# Patient Record
Sex: Male | Born: 1950
Health system: Southern US, Community
[De-identification: ages and names within clinical notes are randomized; demographics above are authoritative.]

## PROBLEM LIST (undated history)

## (undated) DIAGNOSIS — N200 Calculus of kidney: Secondary | ICD-10-CM

## (undated) DIAGNOSIS — Z8719 Personal history of other diseases of the digestive system: Secondary | ICD-10-CM

## (undated) DIAGNOSIS — R112 Nausea with vomiting, unspecified: Secondary | ICD-10-CM

## (undated) DIAGNOSIS — R011 Cardiac murmur, unspecified: Secondary | ICD-10-CM

## (undated) DIAGNOSIS — M459 Ankylosing spondylitis of unspecified sites in spine: Secondary | ICD-10-CM

## (undated) DIAGNOSIS — K469 Unspecified abdominal hernia without obstruction or gangrene: Secondary | ICD-10-CM

## (undated) DIAGNOSIS — R931 Abnormal findings on diagnostic imaging of heart and coronary circulation: Secondary | ICD-10-CM

## (undated) DIAGNOSIS — I719 Aortic aneurysm of unspecified site, without rupture: Secondary | ICD-10-CM

## (undated) DIAGNOSIS — R519 Headache, unspecified: Secondary | ICD-10-CM

## (undated) DIAGNOSIS — T8859XA Other complications of anesthesia, initial encounter: Secondary | ICD-10-CM

## (undated) DIAGNOSIS — K635 Polyp of colon: Secondary | ICD-10-CM

## (undated) DIAGNOSIS — K529 Noninfective gastroenteritis and colitis, unspecified: Secondary | ICD-10-CM

## (undated) DIAGNOSIS — I77810 Thoracic aortic ectasia: Secondary | ICD-10-CM

## (undated) DIAGNOSIS — K219 Gastro-esophageal reflux disease without esophagitis: Secondary | ICD-10-CM

## (undated) DIAGNOSIS — Z87442 Personal history of urinary calculi: Secondary | ICD-10-CM

## (undated) DIAGNOSIS — I351 Nonrheumatic aortic (valve) insufficiency: Secondary | ICD-10-CM

## (undated) DIAGNOSIS — Z9889 Other specified postprocedural states: Secondary | ICD-10-CM

## (undated) HISTORY — PX: HIATAL HERNIA REPAIR: SHX195

## (undated) HISTORY — DX: Abnormal findings on diagnostic imaging of heart and coronary circulation: R93.1

## (undated) HISTORY — DX: Gastro-esophageal reflux disease without esophagitis: K21.9

## (undated) HISTORY — PX: CORNEAL TRANSPLANT: SHX108

## (undated) HISTORY — DX: Noninfective gastroenteritis and colitis, unspecified: K52.9

## (undated) HISTORY — DX: Calculus of kidney: N20.0

## (undated) HISTORY — PX: COLONOSCOPY: SHX174

## (undated) HISTORY — DX: Aortic aneurysm of unspecified site, without rupture: I71.9

## (undated) HISTORY — PX: PROSTATE BIOPSY: SHX241

## (undated) HISTORY — DX: Nonrheumatic aortic (valve) insufficiency: I35.1

## (undated) HISTORY — PX: UPPER GASTROINTESTINAL ENDOSCOPY: SHX188

## (undated) HISTORY — DX: Ankylosing spondylitis of unspecified sites in spine: M45.9

## (undated) HISTORY — DX: Thoracic aortic ectasia: I77.810

## (undated) HISTORY — DX: Polyp of colon: K63.5

## (undated) HISTORY — DX: Unspecified abdominal hernia without obstruction or gangrene: K46.9

---

## 2006-12-08 DIAGNOSIS — R160 Hepatomegaly, not elsewhere classified: Secondary | ICD-10-CM | POA: Insufficient documentation

## 2014-08-09 HISTORY — PX: CATARACT EXTRACTION: SUR2

## 2016-08-09 HISTORY — PX: HERNIA REPAIR: SHX51

## 2016-10-07 DIAGNOSIS — S53105A Unspecified dislocation of left ulnohumeral joint, initial encounter: Secondary | ICD-10-CM | POA: Insufficient documentation

## 2016-12-07 DIAGNOSIS — K469 Unspecified abdominal hernia without obstruction or gangrene: Secondary | ICD-10-CM | POA: Insufficient documentation

## 2017-12-14 DIAGNOSIS — K8042 Calculus of bile duct with acute cholecystitis without obstruction: Secondary | ICD-10-CM | POA: Insufficient documentation

## 2018-01-07 DIAGNOSIS — Z9889 Other specified postprocedural states: Secondary | ICD-10-CM | POA: Insufficient documentation

## 2018-01-07 HISTORY — PX: CHOLECYSTECTOMY: SHX55

## 2018-03-09 HISTORY — PX: KIDNEY STONE SURGERY: SHX686

## 2018-10-17 ENCOUNTER — Ambulatory Visit: Payer: Self-pay | Admitting: Internal Medicine

## 2018-11-08 DIAGNOSIS — K219 Gastro-esophageal reflux disease without esophagitis: Secondary | ICD-10-CM | POA: Insufficient documentation

## 2018-11-10 ENCOUNTER — Ambulatory Visit: Payer: Self-pay | Admitting: Internal Medicine

## 2018-11-23 ENCOUNTER — Ambulatory Visit: Payer: Self-pay | Admitting: Rheumatology

## 2018-12-12 ENCOUNTER — Telehealth: Payer: Self-pay | Admitting: *Deleted

## 2018-12-12 NOTE — Telephone Encounter (Signed)
Copied from [place] [phone]. Topic: Appointment Scheduling - Scheduling Inquiry for Clinic >> Dec 12, 2018  2:33 PM Alanda Slim E wrote: Reason for CRM: Pt called to check if office received records from previous provider/ please advise  He also wanted to confirm appt on 6.2.20

## 2018-12-14 NOTE — Telephone Encounter (Signed)
Records received and appointment confirmed with patient.

## 2018-12-15 NOTE — Progress Notes (Signed)
Office Visit Note  Patient: Antonio Ayers             Date of Birth: 1951/02/18           MRN: [id number]             PCP: [name], [name], MD Referring: Daylene Posey, MD Visit Date: 12/19/2018 Occupation: Retired  Subjective:  Ankylosing spondylitis.   History of Present Illness: Antonio Ayers is a 68 y.o. male seen in consultation per request of his PCP.  According to patient he developed symptoms of ankylosing spondylitis while he was in high school.  He states he is to have knee joint swelling and lower back pain.  He states when he was in his 58s and 40s the pain got worse and he started taking Indocin.  He was on Indocin for many years.  He states at age 77 he started seeing a rheumatologist who offered different medications but he declined.  And he continued taking anti-inflammatories.  He recalls that his ankylosing spondylitis gradually got worse to the point his unable to raise his shoulders.  He has chronic neck and lower back pain.  He states his rheumatologist moved out of town and he was under care of his primary care physician for a long time in [place].  About 4 years ago he started seeing a rheumatologist and initially was on different NSAIDs and then started taking Humira.  He states Humira was a started at every other week dosing but it was not very effective.  Last fall it was approved to every week dosing by his insurance.  He noticed improvement on weekly dosage of Humira.  He states he has less stiffness and better mobility now.  He still have difficulty walking due to tight muscles.  He gives history of intermittent swelling in his knee joints and ankle joints.  He describes a stiffness in his neck, lower back, shoulders, left elbow, hands, hips, knee joints ankles and feet.  Patient states that he had colonoscopy in 2016 was negative except for internal hemorrhoids.  Activities of Daily Living:  Patient reports morning stiffness for 5-10 minutes.    Patient Denies nocturnal pain.  Difficulty dressing/grooming: Reports Difficulty climbing stairs: Reports Difficulty getting out of chair: Reports Difficulty using hands for taps, buttons, cutlery, and/or writing: Denies  Review of Systems  Constitutional: Positive for fatigue. Negative for night sweats.  HENT: Positive for mouth dryness. Negative for mouth sores and nose dryness.   Eyes: Negative for redness and dryness.  Respiratory: Negative for shortness of breath and difficulty breathing.   Cardiovascular: Negative for chest pain, palpitations, hypertension, irregular heartbeat and swelling in legs/feet.  Gastrointestinal: Positive for blood in stool. Negative for constipation and diarrhea.       Patient had internal hemorrhoids on the colonoscopy.  Endocrine: Negative for increased urination.  Musculoskeletal: Positive for arthralgias, joint pain, myalgias, morning stiffness and myalgias. Negative for joint swelling, muscle weakness and muscle tenderness.  Skin: Negative for color change, rash, hair loss, nodules/bumps, skin tightness, ulcers and sensitivity to sunlight.  Allergic/Immunologic: Negative for susceptible to infections.  Neurological: Negative for dizziness, fainting, memory loss, night sweats and weakness ( ).  Hematological: Negative for swollen glands.  Psychiatric/Behavioral: Negative for depressed mood and sleep disturbance. The patient is not nervous/anxious.     PMFS History:  Patient Active Problem List   Diagnosis Date Noted   Raised intraocular pressure of both eyes 12/19/2018   Ankylosing spondylitis of multiple  sites in spine [facility]) 12/19/2018    History reviewed. No pertinent past medical history.  Family History  Problem Relation Age of Onset   Stroke Mother    Stroke Father    Heart failure Father    Diabetes Sister    Obesity Sister    Hiatal hernia Sister    Past Surgical History:  Procedure Laterality Date   CATARACT EXTRACTION  Left 2016   CHOLECYSTECTOMY  01/2018   CORNEAL TRANSPLANT  1989, 1992, 2014   [place]  2018   HIATAL HERNIA REPAIR     KIDNEY STONE SURGERY  03/2018   Social History   Social History Narrative   Not on file    There is no immunization history on file for this patient.   Objective: Vital Signs: BP (!) 109/56 (BP Location: Right Arm, Patient Position: Sitting, Cuff Size: Normal)    Pulse (!) 58    Resp 14    Ht 6' 0.5" (1.842 m)    Wt 207 lb (93.9 kg)    BMI 27.69 kg/m    Physical Exam Vitals signs and nursing note reviewed.  Constitutional:      Appearance: He is well-developed.  HENT:     Head: Normocephalic and atraumatic.  Eyes:     Conjunctiva/sclera: Conjunctivae normal.     Pupils: Pupils are equal, round, and reactive to light.  Neck:     Musculoskeletal: Normal range of motion and neck supple.  Cardiovascular:     Rate and Rhythm: Normal rate and regular rhythm.     Heart sounds: Normal heart sounds.  Pulmonary:     Effort: Pulmonary effort is normal.     Breath sounds: Normal breath sounds.  Abdominal:     General: Bowel sounds are normal.     Palpations: Abdomen is soft.  Skin:    General: Skin is warm and dry.     Capillary Refill: Capillary refill takes less than 2 seconds.  Neurological:     Mental Status: He is alert and oriented to person, place, and time.  Psychiatric:        Behavior: Behavior normal.      Musculoskeletal Exam: Patient has very limited range of motion of his cervical, thoracic and lumbar spine.  Thoracic kyphosis and lumbar scoliosis was noted.  Schober's was very limited.  Head to toe distance was about 14 inches.  Elbow joints, wrist joints, MCPs PIPs and DIPs been good range of motion with no synovitis.  He had no range of motion in her joints.  Knee joints noted tension but no synovitis was noted.  Ankle joints MTPs with good range of motion.  CDAI Exam: CDAI Score: Not documented Patient Global Assessment: Not  documented; Provider Global Assessment: Not documented Swollen: Not documented; Tender: Not documented Joint Exam   Not documented   There is currently no information documented on the homunculus. Go to the Rheumatology activity and complete the homunculus joint exam.  Investigation: No additional findings.  Imaging: No results found.  Recent Labs: No results found for: WBC, HGB, PLT, NA, K, CL, CO2, GLUCOSE, BUN, CREATININE, BILITOT, ALKPHOS, AST, ALT, PROT, ALBUMIN, CALCIUM, GFRAA, QFTBGOLD, QFTBGOLDPLUS  Speciality Comments: No specialty comments available.  Procedures:  No procedures performed Allergies: Patient has no known allergies.   Assessment / Plan:     Visit Diagnoses: Ankylosing spondylitis of multiple sites in spine [facility]) - Pt moved from [place], last seen 06/07/18 by his rheumatologist.  Patient states that his symptoms  started while he was in high school.  He has been on treatment for the last 3 to 4 years.  Initially treated with with NSAIDs for many years and then started Humira couple of years ago.  He states his symptoms improved since he has been on Humira weekly dosing for the last few months.  He continues to have a stiffness and discomfort in multiple joints.  He has noticed improvement in his mobility as well.  High risk medication use - Previously on Humira 40 mg every week -week.  Detailed counseling was provided regarding the indications side effects contraindications.  A handout was given and consent was taken.  I will obtain following labs.  Once the labs are available then we can start him on Humira.  Plan: CBC with Differential/Platelet, COMPLETE METABOLIC PANEL WITH GFR, Hepatitis B core antibody, IgM, Hepatitis B surface antigen, Hepatitis C antibody, QuantiFERON-TB Gold Plus, HIV Antibody (routine testing w rflx), Serum protein electrophoresis with reflex, IgG, IgA, IgM, DG Chest 2 View  Medication counseling:     Does patient have diagnosis of heart  failure?  No  Counseled patient that Humira is a TNF blocking agent.  Reviewed Humira dose of 40 mg every  week.  Counseled patient on purpose, proper use, and adverse effects of Humira.  Reviewed the most common adverse effects including infections, headache, and injection site reactions. Discussed that there is the possibility of an increased risk of malignancy but it is not well understood if this increased risk is due to the medication or the disease state.  Advised patient to get yearly dermatology exams due to risk of skin cancer.  Reviewed the importance of regular labs while on Humira therapy.  Advised patient to get standing labs one month after starting Humira then every 3 months.  Provided patient with standing lab orders.  Counseled patient that Humira should be held prior to scheduled surgery.  Counseled patient to avoid live vaccines while on Humira.  Advised patient to get annual influenza vaccine and the pneumococcal vaccine as needed.  Provided patient with medication education material and answered all questions.  Patient voiced understanding.  Patient consented to Humira.  Will upload consent into the media tab.  Reviewed storage instructions of Humira.  Advised initial injection must be administered in office.    Patient voiced understanding.     Chronic pain of both hips -patient has almost no range of motion in his hip joints.  Plan: XR HIPS BILAT W OR W/O PELVIS 2V.  The x-ray showed severe hip joint narrowing.  I counseled patient regarding total hip replacement but he is not prepared at this time.  Chronic pain of both knees -he has limited extension of bilateral knee joints.  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  He has limited extension of his knee joints.  Raised intraocular pressure of both eyes-he has been followed by ophthalmology.  Other fatigue - Plan: CK, TSH    Orders: Orders Placed This Encounter  Procedures   XR KNEE 3 VIEW RIGHT   XR KNEE 3 VIEW LEFT   XR HIPS BILAT W OR W/O PELVIS 2V   DG Chest 2 View   CBC with Differential/Platelet   COMPLETE METABOLIC PANEL WITH GFR   CK   TSH   Hepatitis B core antibody, IgM   Hepatitis B surface antigen   Hepatitis C antibody   QuantiFERON-TB Gold Plus  HIV Antibody (routine testing w rflx)   Serum protein electrophoresis with reflex   IgG, IgA, IgM   ANA   No orders of the defined types were placed in this encounter.   Face-to-face time spent with patient was 50 minutes. Greater than 50% of time was spent in counseling and coordination of care.  Follow-Up Instructions: Return for Ankylosing spondylitis.   Bo Merino, MD  Note - This record has been created using Editor, commissioning.  Chart creation errors have been sought, but may not always  have been located. Such creation errors do not reflect on  the standard of medical care.

## 2018-12-19 ENCOUNTER — Encounter: Payer: Self-pay | Admitting: Rheumatology

## 2018-12-19 ENCOUNTER — Ambulatory Visit: Payer: Self-pay

## 2018-12-19 ENCOUNTER — Ambulatory Visit (INDEPENDENT_AMBULATORY_CARE_PROVIDER_SITE_OTHER): Payer: Medicare Other

## 2018-12-19 ENCOUNTER — Ambulatory Visit: Payer: Medicare Other | Admitting: Rheumatology

## 2018-12-19 ENCOUNTER — Other Ambulatory Visit: Payer: Self-pay

## 2018-12-19 VITALS — BP 109/56 | HR 58 | Resp 14 | Ht 72.5 in | Wt 207.0 lb

## 2018-12-19 DIAGNOSIS — M25551 Pain in right hip: Secondary | ICD-10-CM

## 2018-12-19 DIAGNOSIS — M25562 Pain in left knee: Secondary | ICD-10-CM

## 2018-12-19 DIAGNOSIS — M25552 Pain in left hip: Secondary | ICD-10-CM

## 2018-12-19 DIAGNOSIS — H40053 Ocular hypertension, bilateral: Secondary | ICD-10-CM

## 2018-12-19 DIAGNOSIS — M25561 Pain in right knee: Secondary | ICD-10-CM | POA: Diagnosis not present

## 2018-12-19 DIAGNOSIS — G8929 Other chronic pain: Secondary | ICD-10-CM

## 2018-12-19 DIAGNOSIS — R5383 Other fatigue: Secondary | ICD-10-CM | POA: Diagnosis not present

## 2018-12-19 DIAGNOSIS — Z79899 Other long term (current) drug therapy: Secondary | ICD-10-CM | POA: Diagnosis not present

## 2018-12-19 DIAGNOSIS — M450 Ankylosing spondylitis of multiple sites in spine: Secondary | ICD-10-CM

## 2018-12-19 NOTE — Patient Instructions (Signed)
Standing Labs We placed an order today for your standing lab work.    Please come back and get your standing labs every 3 months.   We have open lab Monday through Friday from 8:30-11:30 AM and 1:30-4:00 PM  at the office of Dr. Bo Merino.   You may experience shorter wait times on Monday and Friday afternoons. The office is located at [address], [place], [place], [place] [address] No appointment is necessary.   Labs are drawn by [facility].  You may receive a bill from [facility] for your lab work.  If you wish to have your labs drawn at another location, please call the office 24 hours in advance to send orders.  If you have any questions regarding directions or hours of operation,  please call [phone].   Just as a reminder please drink plenty of water prior to coming for your lab work. Thanks!   Adalimumab Injection What is this medicine? ADALIMUMAB (a dal AYE mu mab) is used to treat rheumatoid and psoriatic arthritis. It is also used to treat ankylosing spondylitis, Crohn's disease, ulcerative colitis, plaque psoriasis, hidradenitis suppurativa, and uveitis. This medicine may be used for other purposes; ask your health care provider or pharmacist if you have questions. COMMON BRAND NAME(S): CYLTEZO, Humira What should I tell my health care provider before I take this medicine? They need to know if you have any of these conditions: -diabetes -heart disease -hepatitis B or history of hepatitis B infection -immune system problems -infection or history of infections -multiple sclerosis -recently received or scheduled to receive a vaccine -scheduled to have surgery -tuberculosis, a positive skin test for tuberculosis or have recently been in close contact with someone who has tuberculosis -an unusual reaction to adalimumab, other medicines, mannitol, latex, rubber, foods, dyes, or preservatives -pregnant or trying to get pregnant -breast-feeding How should I use this  medicine? This medicine is for injection under the skin. You will be taught how to prepare and give this medicine. Use exactly as directed. Take your medicine at regular intervals. Do not take your medicine more often than directed. A special MedGuide will be given to you by the pharmacist with each prescription and refill. Be sure to read this information carefully each time. It is important that you put your used needles and syringes in a special sharps container. Do not put them in a trash can. If you do not have a sharps container, call your pharmacist or healthcare provider to get one. Talk to your pediatrician regarding the use of this medicine in children. While this drug may be prescribed for children as young as 2 years for selected conditions, precautions do apply. The manufacturer of the medicine offers free information to patients and their health care partners. Call [phone] for more information. Overdosage: If you think you have taken too much of this medicine contact a poison control center or emergency room at once. NOTE: This medicine is only for you. Do not share this medicine with others. What if I miss a dose? If you miss a dose, take it as soon as you can. If it is almost time for your next dose, take only that dose. Do not take double or extra doses. Give the next dose when your next scheduled dose is due. Call your doctor or health care professional if you are not sure how to handle a missed dose. What may interact with this medicine? Do not take this medicine with any of the following medications: -abatacept -  anakinra -etanercept -infliximab -live virus vaccines -rilonacept This medicine may also interact with the following medications: -vaccines This list may not describe all possible interactions. Give your health care provider a list of all the medicines, herbs, non-prescription drugs, or dietary supplements you use. Also tell them if you smoke, drink alcohol, or  use illegal drugs. Some items may interact with your medicine. What should I watch for while using this medicine? Visit your doctor or health care professional for regular checks on your progress. Tell your doctor or healthcare professional if your symptoms do not start to get better or if they get worse. You will be tested for tuberculosis (TB) before you start this medicine. If your doctor prescribes any medicine for TB, you should start taking the TB medicine before starting this medicine. Make sure to finish the full course of TB medicine. Call your doctor or health care professional if you get a cold or other infection while receiving this medicine. Do not treat yourself. This medicine may decrease your body's ability to fight infection. Talk to your doctor about your risk of cancer. You may be more at risk for certain types of cancers if you take this medicine. What side effects may I notice from receiving this medicine? Side effects that you should report to your doctor or health care professional as soon as possible: -allergic reactions like skin rash, itching or hives, swelling of the face, lips, or tongue -breathing problems -changes in vision -chest pain -fever, chills, or any other sign of infection -numbness or tingling -red, scaly patches or raised bumps on the skin -swelling of the ankles -swollen lymph nodes in the neck, underarm, or groin areas -unexplained weight loss -unusual bleeding or bruising -unusually weak or tired Side effects that usually do not require medical attention (report to your doctor or health care professional if they continue or are bothersome): -headache -nausea -redness, itching, swelling, or bruising at site where injected This list may not describe all possible side effects. Call your doctor for medical advice about side effects. You may report side effects to FDA at 1-800-FDA-1088. Where should I keep my medicine? Keep out of the reach of  children. Store in the original container and in the refrigerator between 2 and 8 degrees C (36 and 46 degrees F). Do not freeze. The product may be stored in a cool carrier with an ice pack, if needed. Protect from light. Throw away any unused medicine after the expiration date. NOTE: This sheet is a summary. It may not cover all possible information. If you have questions about this medicine, talk to your doctor, pharmacist, or health care provider.  2019 Elsevier/Gold Standard (2015-02-12 11:11:43)

## 2018-12-22 ENCOUNTER — Ambulatory Visit (HOSPITAL_COMMUNITY)
Admission: RE | Admit: 2018-12-22 | Discharge: 2018-12-22 | Disposition: A | Discharge status: Home / Self Care | Payer: Medicare Other | Source: Ambulatory Visit | Attending: Rheumatology | Admitting: Rheumatology

## 2018-12-22 ENCOUNTER — Other Ambulatory Visit: Payer: Self-pay

## 2018-12-22 ENCOUNTER — Telehealth: Payer: Self-pay | Admitting: *Deleted

## 2018-12-22 DIAGNOSIS — Z79899 Other long term (current) drug therapy: Secondary | ICD-10-CM | POA: Insufficient documentation

## 2018-12-22 DIAGNOSIS — R0602 Shortness of breath: Secondary | ICD-10-CM | POA: Diagnosis not present

## 2018-12-22 DIAGNOSIS — R778 Other specified abnormalities of plasma proteins: Secondary | ICD-10-CM

## 2018-12-22 LAB — IGG, IGA, IGM
IgG (Immunoglobin G), Serum: 843 mg/dL (ref 600–1540)
IgM, Serum: 35 mg/dL — ABNORMAL LOW (ref 50–300)
Immunoglobulin A: 425 mg/dL — ABNORMAL HIGH (ref 70–320)

## 2018-12-22 LAB — HEPATITIS C ANTIBODY
Hepatitis C Ab: NONREACTIVE
SIGNAL TO CUT-OFF: 0.05 (ref <1.00)

## 2018-12-22 LAB — CBC WITH DIFFERENTIAL/PLATELET
Absolute Monocytes: 850 cells/uL (ref 200–950)
Basophils Absolute: 31 cells/uL (ref 0–200)
Basophils Relative: 0.4 %
Eosinophils Absolute: 101 cells/uL (ref 15–500)
Eosinophils Relative: 1.3 %
HCT: 46.1 % (ref 38.5–50.0)
Hemoglobin: 16.3 g/dL (ref 13.2–17.1)
Lymphs Abs: 1225 cells/uL (ref 850–3900)
MCH: 31.8 pg (ref 27.0–33.0)
MCHC: 35.4 g/dL (ref 32.0–36.0)
MCV: 90 fL (ref 80.0–100.0)
MPV: 9.6 fL (ref 7.5–12.5)
Monocytes Relative: 10.9 %
Neutro Abs: 5593 cells/uL (ref 1500–7800)
Neutrophils Relative %: 71.7 %
Platelets: 205 10*3/uL (ref 140–400)
RBC: 5.12 10*6/uL (ref 4.20–5.80)
RDW: 13 % (ref 11.0–15.0)
Total Lymphocyte: 15.7 %
WBC: 7.8 10*3/uL (ref 3.8–10.8)

## 2018-12-22 LAB — IFE INTERPRETATION

## 2018-12-22 LAB — COMPLETE METABOLIC PANEL WITH GFR
AG Ratio: 1.8 (calc) (ref 1.0–2.5)
ALT: 18 U/L (ref 9–46)
AST: 18 U/L (ref 10–35)
Albumin: 4.2 g/dL (ref 3.6–5.1)
Alkaline phosphatase (APISO): 64 U/L (ref 35–144)
BUN: 16 mg/dL (ref 7–25)
CO2: 25 mmol/L (ref 20–32)
Calcium: 9.1 mg/dL (ref 8.6–10.3)
Chloride: 103 mmol/L (ref 98–110)
Creat: 0.85 mg/dL (ref 0.70–1.25)
GFR, Est African American: 104 mL/min/{1.73_m2} (ref >=60)
GFR, Est Non African American: 90 mL/min/{1.73_m2} (ref >=60)
Globulin: 2.4 g/dL (calc) (ref 1.9–3.7)
Glucose, Bld: 91 mg/dL (ref 65–139)
Potassium: 4.4 mmol/L (ref 3.5–5.3)
Sodium: 139 mmol/L (ref 135–146)
Total Bilirubin: 3.2 mg/dL — ABNORMAL HIGH (ref 0.2–1.2)
Total Protein: 6.6 g/dL (ref 6.1–8.1)

## 2018-12-22 LAB — PROTEIN ELECTROPHORESIS, SERUM, WITH REFLEX
Albumin ELP: 4.1 g/dL (ref 3.8–4.8)
Alpha 1: 0.3 g/dL (ref 0.2–0.3)
Alpha 2: 0.6 g/dL (ref 0.5–0.9)
Beta 2: 0.5 g/dL (ref 0.2–0.5)
Beta Globulin: 0.5 g/dL (ref 0.4–0.6)
Gamma Globulin: 0.7 g/dL — ABNORMAL LOW (ref 0.8–1.7)
Total Protein: 6.7 g/dL (ref 6.1–8.1)

## 2018-12-22 LAB — QUANTIFERON-TB GOLD PLUS
Mitogen-NIL: >10 IU/mL
NIL: 0.02 IU/mL
QuantiFERON-TB Gold Plus: NEGATIVE
TB1-NIL: 0.01 IU/mL
TB2-NIL: 0 IU/mL

## 2018-12-22 LAB — TSH: TSH: 1.59 mIU/L (ref 0.40–4.50)

## 2018-12-22 LAB — HEPATITIS B CORE ANTIBODY, IGM: Hep B C IgM: NONREACTIVE

## 2018-12-22 LAB — HEPATITIS B SURFACE ANTIGEN: Hepatitis B Surface Ag: NONREACTIVE

## 2018-12-22 LAB — CK: Total CK: 103 U/L (ref 44–196)

## 2018-12-22 LAB — HIV ANTIBODY (ROUTINE TESTING W REFLEX): HIV 1&2 Ab, 4th Generation: NONREACTIVE

## 2018-12-22 LAB — ANA: Anti Nuclear Antibody (ANA): NEGATIVE

## 2018-12-22 NOTE — Telephone Encounter (Signed)
-----   Message from Bo Merino, MD sent at 12/22/2018  9:40 AM EDT ----- Labs are all WNLs except IFE. Recommend evluation by hematology. It is not urgent . He may continue Humira.

## 2018-12-22 NOTE — Progress Notes (Signed)
Labs are all WNLs except IFE. Recommend evluation by hematology. It is not urgent . He may continue Humira.

## 2018-12-25 ENCOUNTER — Ambulatory Visit: Payer: Self-pay | Admitting: Rheumatology

## 2018-12-25 NOTE — Progress Notes (Signed)
CXR normal.

## 2018-12-26 ENCOUNTER — Telehealth: Payer: Self-pay | Admitting: Internal Medicine

## 2018-12-26 ENCOUNTER — Encounter: Payer: Self-pay | Admitting: Rheumatology

## 2018-12-26 NOTE — Telephone Encounter (Signed)
A new hem appt has been scheduled for the pt to see Dr. Walden Field on 6/5 at 930am. Pt aware to arrive 15 minutes early.

## 2018-12-29 DIAGNOSIS — M160 Bilateral primary osteoarthritis of hip: Secondary | ICD-10-CM | POA: Insufficient documentation

## 2018-12-29 DIAGNOSIS — M170 Bilateral primary osteoarthritis of knee: Secondary | ICD-10-CM | POA: Insufficient documentation

## 2018-12-29 NOTE — Progress Notes (Signed)
Office Visit Note  Patient: Antonio Ayers             Date of Birth: 27-Apr-1951           MRN: [id number]             PCP: [name], [name], MD Referring: [name], [facility]* Visit Date: 01/02/2019 Occupation: @GUAROCC @  Subjective:  Lower back pain and stiffness   History of Present Illness: Antonio Ayers is a 68 y.o. male with history of ankylosing spondylitis and osteoarthritis.  He is currently Humira 40 mg sq injections weekly.  His most recent injection was today.  He has occasional knee joint pain and joint swelling.  He has intermittent SI joint pain bilaterally.  He experiences pain and limited ROM of both hip joints.  He has occasional ankle joint pain and swelling. He has morning stiffness for about 10 minutes.    Activities of Daily Living:  Patient reports morning stiffness for 10 minutes.   Patient Reports nocturnal pain.  Difficulty dressing/grooming: Reports Difficulty climbing stairs: Reports Difficulty getting out of chair: Reports Difficulty using hands for taps, buttons, cutlery, and/or writing: Denies  Review of Systems  Constitutional: Negative for fatigue and night sweats.  HENT: Positive for mouth dryness. Negative for mouth sores and nose dryness.   Eyes: Negative for pain, redness, itching and dryness.  Respiratory: Negative for cough, hemoptysis, shortness of breath, wheezing and difficulty breathing.   Cardiovascular: Negative for chest pain, palpitations, hypertension, irregular heartbeat and swelling in legs/feet.  Gastrointestinal: Positive for constipation and diarrhea. Negative for abdominal pain and blood in stool.  Endocrine: Negative for increased urination.  Genitourinary: Negative for painful urination.  Musculoskeletal: Positive for arthralgias, joint pain, joint swelling and morning stiffness. Negative for myalgias, muscle weakness, muscle tenderness and myalgias.  Skin: Positive for rash and redness. Negative for color  change, hair loss, nodules/bumps, skin tightness, ulcers and sensitivity to sunlight.  Allergic/Immunologic: Negative for susceptible to infections.  Neurological: Positive for headaches. Negative for dizziness, fainting, light-headedness, memory loss, night sweats and weakness.  Hematological: Negative for swollen glands.  Psychiatric/Behavioral: Negative for depressed mood, confusion and sleep disturbance. The patient is not nervous/anxious.     PMFS History:  Patient Active Problem List   Diagnosis Date Noted  . Primary osteoarthritis of both hips 12/29/2018  . Primary osteoarthritis of both knees 12/29/2018  . Raised intraocular pressure of both eyes 12/19/2018  . Ankylosing spondylitis of multiple sites in spine ([place]) 12/19/2018    History reviewed. No pertinent past medical history.  Family History  Problem Relation Age of Onset  . Stroke Mother   . Stroke Father   . Heart failure Father   . Diabetes Sister   . Obesity Sister   . Hiatal hernia Sister    Past Surgical History:  Procedure Laterality Date  . CATARACT EXTRACTION Left 2016  . CHOLECYSTECTOMY  01/2018  . [place], 2014  . HERNIA REPAIR  2018  . HIATAL HERNIA REPAIR    . KIDNEY STONE SURGERY  03/2018   Social History   Social History Narrative  . Not on file    There is no immunization history on file for this patient.   Objective: Vital Signs: BP 100/60 (BP Location: Left Arm, Patient Position: Sitting, Cuff Size: Normal)   Pulse (!) 48   Resp 14   Ht 6' 0.5" (1.842 m)   Wt 220 lb (99.8 kg)   BMI 29.43 kg/m  Physical Exam Vitals signs and nursing note reviewed.  Constitutional:      Appearance: He is well-developed.  HENT:     Head: Normocephalic and atraumatic.  Eyes:     Conjunctiva/sclera: Conjunctivae normal.     Pupils: Pupils are equal, round, and reactive to light.  Neck:     Musculoskeletal: Normal range of motion and neck supple.  Cardiovascular:     Rate  and Rhythm: Normal rate and regular rhythm.     Heart sounds: Normal heart sounds.  Pulmonary:     Effort: Pulmonary effort is normal.     Breath sounds: Normal breath sounds.  Abdominal:     General: Bowel sounds are normal.     Palpations: Abdomen is soft.  Lymphadenopathy:     Cervical: No cervical adenopathy.  Skin:    General: Skin is warm and dry.     Capillary Refill: Capillary refill takes less than 2 seconds.  Neurological:     Mental Status: He is alert and oriented to person, place, and time.  Psychiatric:        Behavior: Behavior normal.      Musculoskeletal Exam: Very limited ROM of C-spine, thoracic spine, and lumbar spine. Thoracic kyphosis noted. Shoulder joints, elbow joints, wrist joints, MCPs, PIPs, DIPs good range of motion no synovitis. Very limited ROM of both hip joints. Limited extension of bilateral knee joints.  No warmth or effusion of bilateral knee joints.  Ankle joints, MTPs, PIPs, DIPs good range of motion no synovitis.  CDAI Exam: CDAI Score: Not documented Patient Global Assessment: Not documented; Provider Global Assessment: Not documented Swollen: Not documented; Tender: Not documented Joint Exam   Not documented   There is currently no information documented on the homunculus. Go to the Rheumatology activity and complete the homunculus joint exam.  Investigation: No additional findings.  Imaging: Dg Chest 2 View  Result Date: 12/25/2018 CLINICAL DATA:  Shortness of breath with activity. EXAM: CHEST - 2 VIEW COMPARISON:  None. FINDINGS: There bilateral mildly thickened interstitial markings likely chronic. There is no focal parenchymal opacity. There is no pleural effusion or pneumothorax. The heart and mediastinal contours are unremarkable. The osseous structures are unremarkable. IMPRESSION: No active cardiopulmonary disease. Electronically Signed   By: Kathreen Devoid   On: 12/25/2018 08:51   Xr Hips Bilat W Or W/o Pelvis 3-4 Views  Result  Date: 12/19/2018 Bilateral severe hip joint narrowing was noted.  Bilateral protrusio acetabulum was noted.  Bilateral SI joint fusion was noted. Impression: These findings are consistent with severe hip joint narrowing and SI joint fusion consistent with ankylosing spondylitis.  Xr Knee 3 View Left  Result Date: 12/19/2018 Moderate medial compartment narrowing was noted.  Severe patellofemoral narrowing was noted.  No chondrocalcinosis was noted. Impression: These findings are consistent with moderate osteoarthritis and severe chondromalacia patella.  Xr Knee 3 View Right  Result Date: 12/19/2018 Moderate medial compartment narrowing was noted.  Severe patellofemoral narrowing was noted.  No chondrocalcinosis was noted. Impression: These findings are consistent with moderate osteoarthritis and severe chondromalacia patella.   Recent Labs: Lab Results  Component Value Date   WBC 7.8 12/19/2018   HGB 16.3 12/19/2018   PLT 205 12/19/2018   NA 139 12/19/2018   K 4.4 12/19/2018   CL 103 12/19/2018   CO2 25 12/19/2018   GLUCOSE 91 12/19/2018   BUN 16 12/19/2018   CREATININE 0.85 12/19/2018   BILITOT 3.2 (H) 12/19/2018   AST 18 12/19/2018  ALT 18 12/19/2018   PROT 6.6 12/19/2018   PROT 6.7 12/19/2018   CALCIUM 9.1 12/19/2018   GFRAA 104 12/19/2018   QFTBGOLDPLUS NEGATIVE 12/19/2018  Dec 19, 2018 IFE showed a faint IgA kappa monoclonal immunoglobulin, Ig A 425 low, IgM 35 low, HIV negative, hepatitis B-, hepatitis C negative, ANA negative, TSH normal, CK normal  Speciality Comments: No specialty comments available.  Procedures:  No procedures performed Allergies: Patient has no known allergies.   Assessment / Plan:     Visit Diagnoses: Ankylosing spondylitis of multiple sites in spine [facility]) - Diagnosed in [place].  Last seen by rheumatologist June 07, 2018: He is on Humira 40 mg sq weekly injections.  He has very limited range of motion of his C-spine, thoracic spine, lumbar  spine.  Thoracic kyphosis and umbar scoliosis noted.  He has mild SI joint tenderness bilaterally.  He has no synovitis on exam.  He will continue on Humira 40 mg subcutaneous injections every week.  His last injection was today.  He does not need any refills at this time.  He is advised to notify us he develops increased joint pain or joint swelling.  He will follow-up in the office in 5 months.  High risk medication use - NSAIDs for 3 to 4 years followed by Humira for 2 years.  Currently on Humira 40 mg subcu weekly injections. TB gold negative 12/19/18.  Hepatitis B and C negative.  CBC and CMP were drawn on 12/19/2018.  He will return for lab work in August and every 3 months to monitor for drug toxicity.  Standing orders for CBC and CMP were placed today.  Abnormal SPEP: He was referred to hematology.  He has an appointment on 01/12/19.  Primary osteoarthritis of both hips - Bilateral severe osteoarthritis.  He has very limited ROM.  He experiences radiating pain in his groin bilaterally.  Discussed total hip replacement at the last visit.  Primary osteoarthritis of both knees - Bilateral moderate osteoarthritis and severe chondromalacia patella.  Limited extension of bilateral knee joints-He has no warmth or effusion of knee joints.   Raised intraocular pressure of both eyes   Orders: Orders Placed This Encounter  Procedures  . CBC with Differential/Platelet  . COMPLETE METABOLIC PANEL WITH GFR   No orders of the defined types were placed in this encounter.   Follow-Up Instructions: Return in about 5 months (around 06/04/2019) for Ankylosing Spondylitis, Osteoarthritis.   Ofilia Neas, PA-C   I examined and evaluated the patient with Hazel Sams PA.  Lab results were discussed at length.  He will have evaluation by hematology.  He will continue Humira on a weekly basis.  The plan of care was discussed as noted above.  Bo Merino, MD  Note - This record has been created using  Editor, commissioning.  Chart creation errors have been sought, but may not always  have been located. Such creation errors do not reflect on  the standard of medical care.are.

## 2019-01-02 ENCOUNTER — Encounter: Payer: Self-pay | Admitting: Rheumatology

## 2019-01-02 ENCOUNTER — Other Ambulatory Visit: Payer: Self-pay

## 2019-01-02 ENCOUNTER — Ambulatory Visit: Payer: Medicare Other | Admitting: Rheumatology

## 2019-01-02 VITALS — BP 100/60 | HR 48 | Resp 14 | Ht 72.5 in | Wt 220.0 lb

## 2019-01-02 DIAGNOSIS — R778 Other specified abnormalities of plasma proteins: Secondary | ICD-10-CM

## 2019-01-02 DIAGNOSIS — M170 Bilateral primary osteoarthritis of knee: Secondary | ICD-10-CM

## 2019-01-02 DIAGNOSIS — H40053 Ocular hypertension, bilateral: Secondary | ICD-10-CM

## 2019-01-02 DIAGNOSIS — H401111 Primary open-angle glaucoma, right eye, mild stage: Secondary | ICD-10-CM | POA: Diagnosis not present

## 2019-01-02 DIAGNOSIS — M450 Ankylosing spondylitis of multiple sites in spine: Secondary | ICD-10-CM

## 2019-01-02 DIAGNOSIS — H2511 Age-related nuclear cataract, right eye: Secondary | ICD-10-CM | POA: Diagnosis not present

## 2019-01-02 DIAGNOSIS — Z79899 Other long term (current) drug therapy: Secondary | ICD-10-CM

## 2019-01-02 DIAGNOSIS — H401123 Primary open-angle glaucoma, left eye, severe stage: Secondary | ICD-10-CM | POA: Diagnosis not present

## 2019-01-02 DIAGNOSIS — M160 Bilateral primary osteoarthritis of hip: Secondary | ICD-10-CM

## 2019-01-02 DIAGNOSIS — Z947 Corneal transplant status: Secondary | ICD-10-CM | POA: Diagnosis not present

## 2019-01-02 DIAGNOSIS — Z961 Presence of intraocular lens: Secondary | ICD-10-CM | POA: Diagnosis not present

## 2019-01-02 NOTE — Patient Instructions (Signed)
Standing Labs We placed an order today for your standing lab work.    Please come back and get your standing labs in August and every 3 months  We have open lab daily Monday through Thursday from 8:30-12:30 PM and 1:30-4:30 PM and Friday from 8:30-12:30 PM and 1:30 -4:00 PM at the office of Dr. Shaili Deveshwar.   You may experience shorter wait times on Monday and Friday afternoons. The office is located at [address], [address], [place], [place] [address] No appointment is necessary.   Labs are drawn by [facility].  You may receive a bill from [facility] for your lab work.  If you wish to have your labs drawn at another location, please call the office 24 hours in advance to send orders.  If you have any questions regarding directions or hours of operation,  please call [phone].   Just as a reminder please drink plenty of water prior to coming for your lab work. Thanks!   

## 2019-01-09 ENCOUNTER — Other Ambulatory Visit: Payer: Self-pay

## 2019-01-09 ENCOUNTER — Encounter: Payer: Self-pay | Admitting: Internal Medicine

## 2019-01-09 ENCOUNTER — Ambulatory Visit (INDEPENDENT_AMBULATORY_CARE_PROVIDER_SITE_OTHER): Payer: Medicare Other | Admitting: Internal Medicine

## 2019-01-09 VITALS — BP 100/70 | HR 88 | Temp 98.1°F | Wt 218.3 lb

## 2019-01-09 DIAGNOSIS — N200 Calculus of kidney: Secondary | ICD-10-CM | POA: Diagnosis not present

## 2019-01-09 DIAGNOSIS — E663 Overweight: Secondary | ICD-10-CM

## 2019-01-09 DIAGNOSIS — I351 Nonrheumatic aortic (valve) insufficiency: Secondary | ICD-10-CM | POA: Insufficient documentation

## 2019-01-09 DIAGNOSIS — M450 Ankylosing spondylitis of multiple sites in spine: Secondary | ICD-10-CM | POA: Diagnosis not present

## 2019-01-09 DIAGNOSIS — H40053 Ocular hypertension, bilateral: Secondary | ICD-10-CM

## 2019-01-09 DIAGNOSIS — N400 Enlarged prostate without lower urinary tract symptoms: Secondary | ICD-10-CM | POA: Insufficient documentation

## 2019-01-09 NOTE — Progress Notes (Signed)
New Patient Office Visit     CC/Reason for Visit: Establish care, follow-up on chronic medical conditions Previous PCP: in [place] Last Visit: Fall 2019  HPI: Antonio Ayers is a 68 y.o. male who is coming in today for the above mentioned reasons.  He just relocated from [place].  Past Medical History is significant for: Ankylosing spondylitis followed by rheumatology on once weekly Humira and NSAIDs, he has a past medical history significant for BPH and nephrolithiasis was followed by a urologist in [place], was recommended a lithotripsy, he has also had 3 prostate biopsies all of which have been negative.  He had cholelithiasis and choledocholithiasis last year status post ERCP and cholecystectomy.  He also has glaucoma and has had bilateral corneal transplants and is followed by local ophthalmology.  He has also been diagnosed with aortic valve regurgitation who states he last had an echocardiogram 2 years ago and was told he was stable he is interested in having follow-up with a cardiologist.  He has no acute complaints today.  He has a never smoker, drinks alcohol occasionally.  Family history significant for mother and father who both died with strokes, he has a sister who is a diabetic.   Past Medical/Surgical History: Past Medical History:  Diagnosis Date  . Ankylosing spondylitis ([place])     Past Surgical History:  Procedure Laterality Date  . CATARACT EXTRACTION Left 2016  . CHOLECYSTECTOMY  01/2018  . [place], 2014  . HERNIA REPAIR  2018  . HIATAL HERNIA REPAIR    . KIDNEY STONE SURGERY  03/2018    Social History:  reports that he has never smoked. He has never used smokeless tobacco. He reports current alcohol use. He reports previous drug use.  Allergies: No Known Allergies  Family History:  Family History  Problem Relation Age of Onset  . Stroke Mother   . Stroke Father   . Heart failure Father   . Diabetes Sister   . Obesity  Sister   . Hiatal hernia Sister      Current Outpatient Medications:  .  Adalimumab (HUMIRA) 40 MG/0.4ML PSKT, once a week., Disp: , Rfl:  .  Cholecalciferol (D3-1000) 25 MCG (1000 UT) capsule, daily., Disp: , Rfl:  .  COMBIGAN 0.2-0.5 % ophthalmic solution, INT 1 GTT IN OU BID, Disp: , Rfl:  .  latanoprost (XALATAN) 0.005 % ophthalmic solution, INT 1 GTT IN OS QPM B DINNER, Disp: , Rfl:  .  LOTEMAX 0.5 % GEL, INT 1 GTT IN OS D, Disp: , Rfl:  .  Polyethylene Glycol 3350 (MIRALAX PO), Take by mouth as needed., Disp: , Rfl:  .  potassium citrate (UROCIT-K) 10 MEQ (1080 MG) SR tablet, 3 (three) times daily., Disp: , Rfl:   Review of Systems:  Constitutional: Denies fever, chills, diaphoresis, appetite change and fatigue.  HEENT: Denies photophobia, eye pain, redness, hearing loss, ear pain, congestion, sore throat, rhinorrhea, sneezing, mouth sores, trouble swallowing, neck pain, neck stiffness and tinnitus.   Respiratory: Denies SOB, DOE, cough, chest tightness,  and wheezing.   Cardiovascular: Denies chest pain, palpitations and leg swelling.  Gastrointestinal: Denies nausea, vomiting, abdominal pain, diarrhea, constipation, blood in stool and abdominal distention.  Genitourinary: Denies dysuria, urgency, frequency, hematuria, flank pain and difficulty urinating.  Endocrine: Denies: hot or cold intolerance, sweats, changes in hair or nails, polyuria, polydipsia. Musculoskeletal: Denies myalgias, back pain, joint swelling, arthralgias and gait problem.  Skin: Denies pallor, rash and wound.  Neurological:  Denies dizziness, seizures, syncope, weakness, light-headedness, numbness and headaches.  Hematological: Denies adenopathy. Easy bruising, personal or family bleeding history  Psychiatric/Behavioral: Denies suicidal ideation, mood changes, confusion, nervousness, sleep disturbance and agitation    Physical Exam: Vitals:   01/09/19 1248  BP: 100/70  Pulse: 88  Temp: 98.1 F (36.7 C)   TempSrc: Oral  SpO2: 94%  Weight: 218 lb 4.8 oz (99 kg)   Body mass index is 29.2 kg/m.  Constitutional: NAD, calm, comfortable Eyes: PERRL, lids and conjunctivae normal, wears corrective lenses ENMT: Mucous membranes are moist.  Respiratory: clear to auscultation bilaterally, no wheezing, no crackles. Normal respiratory effort. No accessory muscle use.  Cardiovascular: Regular rate and rhythm, no murmurs / rubs / gallops. No extremity edema. 2+ pedal pulses. No carotid bruits.  Abdomen: no tenderness, no masses palpated. No hepatosplenomegaly. Bowel sounds positive.  Musculoskeletal: no clubbing / cyanosis. No joint deformity upper and lower extremities. Good ROM, no contractures. Normal muscle tone.  Psychiatric: Normal judgment and insight. Alert and oriented x 3. Normal mood.    Impression and Plan:  Raised intraocular pressure of both eyes -Has already secured local follow-up with ophthalmology  Nephrolithiasis  Benign prostatic hyperplasia without lower urinary tract symptoms  -Schedule follow-up with urology at recommendation of his previous urologist in [place].  Overweight (BMI 25.0-29.9) -Discussed healthy lifestyle, including increased physical activity and better food choices to promote weight loss.   Ankylosing spondylitis of multiple sites in spine [facility]) -Is seen rheumatology.  Aortic valve insufficiency, etiology of cardiac valve disease unspecified - Plan: Ambulatory referral to Cardiology, per patient request.     Patient Instructions  -Nice meeting you today!  -Schedule follow up in 4-6 months for your wellness visit. Please come in fasting that day.     Lelon Frohlich, MD [facility]

## 2019-01-09 NOTE — Patient Instructions (Signed)
-  Nice meeting you today!  -Schedule follow up in 4-6 months for your wellness visit. Please come in fasting that day.

## 2019-01-09 NOTE — Telephone Encounter (Signed)
I called pt. To inform him that he could call [facility]. Check to see if they have any cancellation for a sooner appt; Pt stated that he did not want to go to the [place] office. I informed him that the referral went to the [facility] office and he said he would call to see if they have any cancellation for a sooner appt      Copied from [place] [phone]. Topic: General - Other >> Jan 09, 2019  3:10 PM Richardo Priest, NT wrote: Patient calling in stating that the [facility] is a bit far and would like one closer. Call back is [phone].

## 2019-01-10 ENCOUNTER — Encounter: Payer: Self-pay | Admitting: Internal Medicine

## 2019-01-11 ENCOUNTER — Ambulatory Visit: Payer: Self-pay | Admitting: Rheumatology

## 2019-01-12 ENCOUNTER — Inpatient Hospital Stay: Payer: Medicare Other

## 2019-01-12 ENCOUNTER — Inpatient Hospital Stay: Payer: Medicare Other | Attending: Internal Medicine | Admitting: Internal Medicine

## 2019-01-12 ENCOUNTER — Other Ambulatory Visit: Payer: Self-pay

## 2019-01-12 VITALS — BP 104/69 | HR 88 | Temp 98.0°F | Resp 18 | Ht 72.0 in | Wt 217.2 lb

## 2019-01-12 DIAGNOSIS — M459 Ankylosing spondylitis of unspecified sites in spine: Secondary | ICD-10-CM | POA: Diagnosis not present

## 2019-01-12 DIAGNOSIS — R899 Unspecified abnormal finding in specimens from other organs, systems and tissues: Secondary | ICD-10-CM

## 2019-01-12 DIAGNOSIS — M2550 Pain in unspecified joint: Secondary | ICD-10-CM | POA: Diagnosis not present

## 2019-01-12 DIAGNOSIS — N400 Enlarged prostate without lower urinary tract symptoms: Secondary | ICD-10-CM | POA: Insufficient documentation

## 2019-01-12 DIAGNOSIS — Z79899 Other long term (current) drug therapy: Secondary | ICD-10-CM

## 2019-01-12 DIAGNOSIS — D801 Nonfamilial hypogammaglobulinemia: Secondary | ICD-10-CM

## 2019-01-12 LAB — CMP (CANCER CENTER ONLY)
ALT: 21 U/L (ref 0–44)
AST: 17 U/L (ref 15–41)
Albumin: 4.1 g/dL (ref 3.5–5.0)
Alkaline Phosphatase: 65 U/L (ref 38–126)
Anion gap: 8 (ref 5–15)
BUN: 12 mg/dL (ref 8–23)
CO2: 24 mmol/L (ref 22–32)
Calcium: 8.6 mg/dL — ABNORMAL LOW (ref 8.9–10.3)
Chloride: 106 mmol/L (ref 98–111)
Creatinine: 0.74 mg/dL (ref 0.61–1.24)
GFR, Est AFR Am: >60 mL/min (ref >60)
GFR, Estimated: >60 mL/min (ref >60)
Glucose, Bld: 101 mg/dL — ABNORMAL HIGH (ref 70–99)
Potassium: 4.5 mmol/L (ref 3.5–5.1)
Sodium: 138 mmol/L (ref 135–145)
Total Bilirubin: 3.1 mg/dL — ABNORMAL HIGH (ref 0.3–1.2)
Total Protein: 6.7 g/dL (ref 6.5–8.1)

## 2019-01-12 LAB — CBC WITH DIFFERENTIAL (CANCER CENTER ONLY)
Abs Immature Granulocytes: 0.02 10*3/uL (ref 0.00–0.07)
Basophils Absolute: 0 10*3/uL (ref 0.0–0.1)
Basophils Relative: 0 %
Eosinophils Absolute: 0.1 10*3/uL (ref 0.0–0.5)
Eosinophils Relative: 1 %
HCT: 43.4 % (ref 39.0–52.0)
Hemoglobin: 15.6 g/dL (ref 13.0–17.0)
Immature Granulocytes: 0 %
Lymphocytes Relative: 18 %
Lymphs Abs: 1.3 10*3/uL (ref 0.7–4.0)
MCH: 32 pg (ref 26.0–34.0)
MCHC: 35.9 g/dL (ref 30.0–36.0)
MCV: 89.1 fL (ref 80.0–100.0)
Monocytes Absolute: 0.8 10*3/uL (ref 0.1–1.0)
Monocytes Relative: 10 %
Neutro Abs: 5.1 10*3/uL (ref 1.7–7.7)
Neutrophils Relative %: 71 %
Platelet Count: 176 10*3/uL (ref 150–400)
RBC: 4.87 MIL/uL (ref 4.22–5.81)
RDW: 12.9 % (ref 11.5–15.5)
WBC Count: 7.3 10*3/uL (ref 4.0–10.5)
nRBC: 0 % (ref 0.0–0.2)

## 2019-01-12 LAB — LACTATE DEHYDROGENASE: LDH: 161 U/L (ref 98–192)

## 2019-01-12 LAB — FERRITIN: Ferritin: 119 ng/mL (ref 24–336)

## 2019-01-12 NOTE — Progress Notes (Signed)
Referring Physician:  Dr. Bo Merino  Diagnosis Abnormal laboratory test - Plan: CBC with Differential ([place] Only), CMP ([place] only), Lactate dehydrogenase (LDH), Ferritin, Rheumatoid factor, SPEP with reflex to IFE, Kappa/lambda light chains, QIG  (Quant. immunoglobulins  - IgG, IgA, IgM), CANCELED: ANA, IFA (with reflex)  Staging Cancer Staging No matching staging information was found for the patient.  Assessment and Plan:  1.  Hypogammaglobulinemia.  68 year old male referred for evaluation due to abnormal SPEP.  Pt has diagnosis of  Ankylosing spondylitis that was diagnosed in [place].  He is now followed by Dr. Estanislado Pandy.  He is on Humira 40 mg sq weekly injections. Due to joint pain rheumatology ordered SPEP that was done 12/19/2018 and showed  Consistent with hypogammaglobulinemia.  Pt reports he did not have SPEP checked in [place].    Labs done 12/19/2018 showed WBC 7.8 HB 16.3 plts 205,000.  Chemistries showed K+ 4.4 Cr 0.85 AST 18 ALT 18 Elevated bilirubin of 3.2.  Calcium WNL at 9.1.  Hepatitis and HIV testing is negative.    He reports chronic joint pain related to diagnosis of  Ankylosing spondylitis.    Pt is seen today for consultation due to SPEP findings.    Labs done today 01/12/2019 reviewed and showed  WBC 7.3 HB 15.6 plts 176,000.  Chemistries showed K+ 4.5 Cr 0.74 Calcium 8.6.  Awaiting results of SPEP, FLC and Quant IG.    I discussed with pt he has minimal IGA elevation of 425.  Increased serum levels of IgA are seen in several inflammatory disorders, including vasculitis, HIV, Hepatitis Myeloma and several autoimmune diseases such as RA and lupus.  I suspect the findings of  hypogammaglobulinemia.noted on recent SPEP are due to AS and Humira. There is no evidence of monoclonal gammopathy.    Pt has no evidence of anemia, RI or elevated calcium.  Pt reassured.   Pt will have phone visit in 2 weeks to go over remaining results.   2.  Ankylosing  spondylitis.  Pt is on Humira weekly and is followed by Rheumatology.    3.  Joint pain.  Related to AS.  Follow-up with Rheumatology as directed.    4.  Health maintenance.  GI follow-up per PCP.    30 minutes spent with more than 50% spent in review of records, counseling and coordination of care.    HPI:  68 year old male referred for evaluation due to abnormal SPEP.  Pt has diagnosis of  Ankylosing spondylitis that was diagnosed in [place].  He is now followed by Dr. Estanislado Pandy.  He is on Humira 40 mg sq weekly injections. Due to joint pain rheumatology ordered SPEP that was done 12/19/2018 and showed  -Consistent with hypogammaglobulinemia.  Pt reports he he reviewed prior lab evaluation and did not have SPEP checked in [place].    Labs done 12/19/2018 showed WBC 7.8 HB 16.3 plts 205,000.  Chemistries showed K+ 4.4 Cr 0.85 AST 18 ALT 18 Elevated bilirubin of 3.2.  Calcium WNL at 9.1.  Hepatitis and HIV testing is negative.    He reports chronic joint pain related to diagnosis of  Ankylosing spondylitis.    Pt is seen today for consultation due to SPEP findings.    Problem List Patient Active Problem List   Diagnosis Date Noted  . Aortic valve regurgitation [I35.1] 01/09/2019  . Nephrolithiasis [N20.0] 01/09/2019  . BPH (benign prostatic hyperplasia) [N40.0] 01/09/2019  . Overweight (BMI 25.0-29.9) [E66.3] 01/09/2019  .  Primary osteoarthritis of both hips [M16.0] 12/29/2018  . Primary osteoarthritis of both knees [M17.0] 12/29/2018  . Raised intraocular pressure of both eyes [H40.053] 12/19/2018  . Ankylosing spondylitis of multiple sites in spine [facility]) [M45.0] 12/19/2018    Past Medical History Past Medical History:  Diagnosis Date  . Ankylosing spondylitis [facility])     Past Surgical History Past Surgical History:  Procedure Laterality Date  . CATARACT EXTRACTION Left 2016  . CHOLECYSTECTOMY  01/2018  . [place], 2014  . HERNIA REPAIR  2018  . HIATAL  HERNIA REPAIR    . KIDNEY STONE SURGERY  03/2018    Family History Family History  Problem Relation Age of Onset  . Stroke Mother   . Stroke Father   . Heart failure Father   . Diabetes Sister   . Obesity Sister   . Hiatal hernia Sister      Social History  reports that he has never smoked. He has never used smokeless tobacco. He reports current alcohol use. He reports previous drug use.  Medications  Current Outpatient Medications:  .  Adalimumab (HUMIRA) 40 MG/0.4ML PSKT, once a week., Disp: , Rfl:  .  Cholecalciferol (D3-1000) 25 MCG (1000 UT) capsule, daily., Disp: , Rfl:  .  COMBIGAN 0.2-0.5 % ophthalmic solution, INT 1 GTT IN OU BID, Disp: , Rfl:  .  latanoprost (XALATAN) 0.005 % ophthalmic solution, INT 1 GTT IN OS QPM B DINNER, Disp: , Rfl:  .  LOTEMAX 0.5 % GEL, INT 1 GTT IN OS D, Disp: , Rfl:  .  Polyethylene Glycol 3350 (MIRALAX PO), Take by mouth as needed., Disp: , Rfl:  .  potassium citrate (UROCIT-K) 10 MEQ (1080 MG) SR tablet, 3 (three) times daily., Disp: , Rfl:   Allergies Patient has no known allergies.  Review of Systems Review of Systems - Oncology ROS negative   Physical Exam  Vitals Wt Readings from Last 3 Encounters:  01/12/19 217 lb 3.2 oz (98.5 kg)  01/09/19 218 lb 4.8 oz (99 kg)  01/02/19 220 lb (99.8 kg)   Temp Readings from Last 3 Encounters:  01/12/19 98 F (36.7 C) (Oral)  01/09/19 98.1 F (36.7 C) (Oral)   BP Readings from Last 3 Encounters:  01/12/19 104/69  01/09/19 100/70  01/02/19 100/60   Pulse Readings from Last 3 Encounters:  01/12/19 88  01/09/19 88  01/02/19 (!) 48    Constitutional: Well-developed, well-nourished, and in no distress.   HENT: Head: Normocephalic and atraumatic.  Mouth/Throat: No oropharyngeal exudate. Mucosa moist. Eyes: Pupils are equal, round, and reactive to light. Conjunctivae are normal. No scleral icterus.  Neck: Normal range of motion. Neck supple. No JVD present.  Cardiovascular:  Normal rate, regular rhythm and normal heart sounds.  Exam reveals no gallop and no friction rub.   Murmur noted.  Pulmonary/Chest: Effort normal and breath sounds normal. No respiratory distress. No wheezes.No rales.  Abdominal: Soft. Bowel sounds are normal. No distension. There is no tenderness. There is no guarding.  Musculoskeletal: No edema.  Joint tenderness due to AS.   Lymphadenopathy: No cervical,axillary or supraclavicular adenopathy.  Neurological: Alert and oriented to person, place, and time. No cranial nerve deficit.  Skin: Skin is warm and dry. No rash noted. No erythema. No pallor.  Psychiatric: Affect and judgment normal.   Labs Appointment on 01/12/2019  Component Date Value Ref Range Status  . Ferritin 01/12/2019 119  24 - 336 ng/mL Final   Performed at  [facility], [place] [address] [place], [place] [address]  . LDH 01/12/2019 161  98 - 192 U/L Final   Performed at [facility], [place] [address], [place], [place] [address]  . Sodium 01/12/2019 138  135 - 145 mmol/L Final  . Potassium 01/12/2019 4.5  3.5 - 5.1 mmol/L Final  . Chloride 01/12/2019 106  98 - 111 mmol/L Final  . CO2 01/12/2019 24  22 - 32 mmol/L Final  . Glucose, Bld 01/12/2019 101* 70 - 99 mg/dL Final  . BUN 01/12/2019 12  8 - 23 mg/dL Final  . Creatinine 01/12/2019 0.74  0.61 - 1.24 mg/dL Final  . Calcium 01/12/2019 8.6* 8.9 - 10.3 mg/dL Final  . Total Protein 01/12/2019 6.7  6.5 - 8.1 g/dL Final  . Albumin 01/12/2019 4.1  3.5 - 5.0 g/dL Final  . AST 01/12/2019 17  15 - 41 U/L Final  . ALT 01/12/2019 21  0 - 44 U/L Final  . Alkaline Phosphatase 01/12/2019 65  38 - 126 U/L Final  . Total Bilirubin 01/12/2019 3.1* 0.3 - 1.2 mg/dL Final  . GFR, Est Non Af Am 01/12/2019 >60  >60 mL/min Final  . GFR, Est AFR Am 01/12/2019 >60  >60 mL/min Final  . Anion gap 01/12/2019 8  5 - 15 Final   Performed at [facility], [place] [address],  [place], [place] [address]  . WBC Count 01/12/2019 7.3  4.0 - 10.5 K/uL Final  . RBC 01/12/2019 4.87  4.22 - 5.81 MIL/uL Final  . Hemoglobin 01/12/2019 15.6  13.0 - 17.0 g/dL Final  . HCT 01/12/2019 43.4  39.0 - 52.0 % Final  . MCV 01/12/2019 89.1  80.0 - 100.0 fL Final  . MCH 01/12/2019 32.0  26.0 - 34.0 pg Final  . MCHC 01/12/2019 35.9  30.0 - 36.0 g/dL Final  . RDW 01/12/2019 12.9  11.5 - 15.5 % Final  . Platelet Count 01/12/2019 176  150 - 400 K/uL Final  . nRBC 01/12/2019 0.0  0.0 - 0.2 % Final  . Neutrophils Relative % 01/12/2019 71  % Final  . Neutro Abs 01/12/2019 5.1  1.7 - 7.7 K/uL Final  . Lymphocytes Relative 01/12/2019 18  % Final  . Lymphs Abs 01/12/2019 1.3  0.7 - 4.0 K/uL Final  . Monocytes Relative 01/12/2019 10  % Final  . Monocytes Absolute 01/12/2019 0.8  0.1 - 1.0 K/uL Final  . Eosinophils Relative 01/12/2019 1  % Final  . Eosinophils Absolute 01/12/2019 0.1  0.0 - 0.5 K/uL Final  . Basophils Relative 01/12/2019 0  % Final  . Basophils Absolute 01/12/2019 0.0  0.0 - 0.1 K/uL Final  . Immature Granulocytes 01/12/2019 0  % Final  . Abs Immature Granulocytes 01/12/2019 0.02  0.00 - 0.07 K/uL Final   Performed at [facility], [place] [address] [place], [place] [address]     Pathology Orders Placed This Encounter  Procedures  . CBC with Differential ([facility] Only)    Standing Status:   Future    Number of Occurrences:   1    Standing Expiration Date:   01/12/2020  . CMP ([place] only)    Standing Status:   Future    Number of Occurrences:   1    Standing Expiration Date:   01/12/2020  . Lactate dehydrogenase (LDH)    Standing Status:   Future    Number of Occurrences:  1    Standing Expiration Date:   01/12/2020  . Ferritin    Standing Status:   Future    Number of Occurrences:   1    Standing Expiration Date:   01/12/2020  . Rheumatoid factor    Standing Status:   Future    Number of Occurrences:   1    Standing Expiration Date:    01/12/2020  . SPEP with reflex to IFE    Standing Status:   Future    Number of Occurrences:   1    Standing Expiration Date:   01/12/2020  . Kappa/lambda light chains    Standing Status:   Future    Number of Occurrences:   1    Standing Expiration Date:   01/12/2020  . QIG  (Quant. immunoglobulins  - IgG, IgA, IgM)    Standing Status:   Future    Number of Occurrences:   1    Standing Expiration Date:   01/12/2020       Zoila Shutter MD

## 2019-01-13 LAB — RHEUMATOID FACTOR: Rheumatoid fact SerPl-aCnc: <10 IU/mL (ref 0.0–13.9)

## 2019-01-13 LAB — IGG, IGA, IGM
IgA: 418 mg/dL (ref 61–437)
IgG (Immunoglobin G), Serum: 771 mg/dL (ref 603–1613)
IgM (Immunoglobulin M), Srm: 31 mg/dL (ref 20–172)

## 2019-01-15 ENCOUNTER — Telehealth: Payer: Self-pay | Admitting: Internal Medicine

## 2019-01-15 LAB — PROTEIN ELECTROPHORESIS, SERUM, WITH REFLEX
A/G Ratio: 1.4 (ref 0.7–1.7)
Albumin ELP: 3.8 g/dL (ref 2.9–4.4)
Alpha-1-Globulin: 0.2 g/dL (ref 0.0–0.4)
Alpha-2-Globulin: 0.6 g/dL (ref 0.4–1.0)
Beta Globulin: 1.1 g/dL (ref 0.7–1.3)
Gamma Globulin: 0.7 g/dL (ref 0.4–1.8)
Globulin, Total: 2.7 g/dL (ref 2.2–3.9)
Total Protein ELP: 6.5 g/dL (ref 6.0–8.5)

## 2019-01-15 LAB — KAPPA/LAMBDA LIGHT CHAINS
Kappa free light chain: 14 mg/L (ref 3.3–19.4)
Kappa, lambda light chain ratio: 1.69 — ABNORMAL HIGH (ref 0.26–1.65)
Lambda free light chains: 8.3 mg/L (ref 5.7–26.3)

## 2019-01-15 NOTE — Telephone Encounter (Signed)
I left a message regarding 6/19

## 2019-01-18 ENCOUNTER — Ambulatory Visit: Payer: Self-pay | Admitting: Rheumatology

## 2019-01-23 ENCOUNTER — Telehealth: Payer: Self-pay | Admitting: *Deleted

## 2019-01-23 NOTE — Telephone Encounter (Signed)
Copied from [place] [phone]. Topic: General - Other >> Jan 22, 2019 11:21 AM Rainey Pines A wrote: Patient calling to see if his medical records were received that were faxed in May from [place]. Patient would like a callback.

## 2019-01-23 NOTE — Telephone Encounter (Signed)
No idea, have they?

## 2019-01-26 ENCOUNTER — Inpatient Hospital Stay (HOSPITAL_BASED_OUTPATIENT_CLINIC_OR_DEPARTMENT_OTHER): Payer: Medicare Other | Admitting: Internal Medicine

## 2019-01-26 DIAGNOSIS — M459 Ankylosing spondylitis of unspecified sites in spine: Secondary | ICD-10-CM

## 2019-01-26 DIAGNOSIS — R899 Unspecified abnormal finding in specimens from other organs, systems and tissues: Secondary | ICD-10-CM | POA: Diagnosis not present

## 2019-01-26 NOTE — Progress Notes (Signed)
Virtual Visit via Telephone Note  I connected with Antonio Ayers on 01/26/19 at  9:30 AM EDT by telephone and verified that I am speaking with the correct person using two identifiers.   I discussed the limitations, risks, security and privacy concerns of performing an evaluation and management service by telephone and the availability of in person appointments. I also discussed with the patient that there may be a patient responsible charge related to this service. The patient expressed understanding and agreed to proceed.  Interval history:  Historical data obtained from note dated 01/12/2019.  68 year old male referred for evaluation due to abnormal SPEP.  Pt has diagnosis of  Ankylosing spondylitis that was diagnosed in [place].  He is now followed by Dr. Estanislado Pandy.  He is on Humira 40 mg sq weekly injections. Due to joint pain rheumatology ordered SPEP that was done 12/19/2018 and showed  -Consistent with hypogammaglobulinemia.  Pt reports he he reviewed prior lab evaluation and did not have SPEP checked in [place].    Labs done 12/19/2018 showed WBC 7.8 HB 16.3 plts 205,000.  Chemistries showed K+ 4.4 Cr 0.85 AST 18 ALT 18 Elevated bilirubin of 3.2.  Calcium WNL at 9.1.  Hepatitis and HIV testing is negative.    He reports chronic joint pain related to diagnosis of  Ankylosing spondylitis.    Observations/Objective: Review of labs from 01/12/2019.    Assessment and Plan:   1.  Hypogammaglobulinemia.  68 year old male referred for evaluation due to abnormal SPEP.  Pt has diagnosis of  Ankylosing spondylitis that was diagnosed in [place].  He is now followed by Dr. Estanislado Pandy.  He is on Humira 40 mg sq weekly injections. Due to joint pain rheumatology ordered SPEP that was done 12/19/2018 and showed  Consistent with hypogammaglobulinemia.  Pt reports he did not have SPEP checked in [place].    Labs done 12/19/2018 showed WBC 7.8 HB 16.3 plts 205,000.  Chemistries showed K+ 4.4 Cr 0.85 AST 18 ALT  18 Elevated bilirubin of 3.2.  Calcium WNL at 9.1.  Hepatitis and HIV testing is negative.    He reports chronic joint pain related to diagnosis of  Ankylosing spondylitis.    Labs done 01/12/2019 reviewed and showed  WBC 7.3 HB 15.6 plts 176,000.  Chemistries showed K+ 4.5 Cr 0.74 Calcium 8.6.  He has normal SPEP, FLC and Quant IG.  IGA level normal at 418.    There is no evidence of monoclonal gammopathy.   Pt has normal immunoglobulin levels.   Pt has no evidence of anemia, RI or elevated calcium.  Pt reassured labs are WNL.  I suspect the findings of  hypogammaglobulinemia.noted on previous SPEP are due to AS and Humira.  Pt will RTC in 07/2019 with repeat labs.    2.  Ankylosing spondylitis.  Pt is on Humira weekly and is followed by Rheumatology.    3.  Joint pain.  Related to AS.  Pt has negative RA and ANA.  Follow-up with Rheumatology as directed.    4.  Health maintenance.  GI follow-up per PCP.  Pt reports he is scheduled for urology evaluation for PSA check.    Follow Up Instructions: Labs and follow-up in 07/2019  I discussed the assessment and treatment plan with the patient. The patient was provided an opportunity to ask questions and all were answered. The patient agreed with the plan and demonstrated an understanding of the instructions.   The patient was advised to call back  or seek an in-person evaluation if the symptoms worsen or if the condition fails to improve as anticipated.  I provided 15 minutes of non-face-to-face time during this encounter.   Zoila Shutter, MD

## 2019-01-29 ENCOUNTER — Telehealth: Payer: Self-pay | Admitting: Internal Medicine

## 2019-01-29 NOTE — Telephone Encounter (Signed)
Scheduled lab per los. No f/u scheduled due to MD not having finished template

## 2019-01-31 DIAGNOSIS — R350 Frequency of micturition: Secondary | ICD-10-CM | POA: Diagnosis not present

## 2019-01-31 DIAGNOSIS — N200 Calculus of kidney: Secondary | ICD-10-CM | POA: Diagnosis not present

## 2019-02-01 NOTE — Telephone Encounter (Signed)
Release found in chart.  Will refax and get records.

## 2019-02-07 DIAGNOSIS — H401123 Primary open-angle glaucoma, left eye, severe stage: Secondary | ICD-10-CM | POA: Diagnosis not present

## 2019-02-07 DIAGNOSIS — Z947 Corneal transplant status: Secondary | ICD-10-CM | POA: Diagnosis not present

## 2019-02-07 DIAGNOSIS — Z961 Presence of intraocular lens: Secondary | ICD-10-CM | POA: Diagnosis not present

## 2019-02-12 DIAGNOSIS — N200 Calculus of kidney: Secondary | ICD-10-CM | POA: Diagnosis not present

## 2019-02-13 ENCOUNTER — Other Ambulatory Visit: Payer: Self-pay | Admitting: Pharmacist

## 2019-02-13 ENCOUNTER — Encounter: Payer: Self-pay | Admitting: Rheumatology

## 2019-02-13 DIAGNOSIS — M450 Ankylosing spondylitis of multiple sites in spine: Secondary | ICD-10-CM

## 2019-02-13 MED ORDER — HUMIRA (2 SYRINGE) 40 MG/0.4ML ~~LOC~~ PSKT: 40.0000 mg | PREFILLED_SYRINGE | SUBCUTANEOUS | 2 refills | Status: DC

## 2019-02-13 NOTE — Progress Notes (Signed)
Updated medication list to reflect that he receives Humira through [facility].  He does not need a refill at this time.

## 2019-02-15 ENCOUNTER — Telehealth: Payer: Self-pay | Admitting: Cardiology

## 2019-02-15 NOTE — Progress Notes (Signed)
Virtual Visit via Video Note   This visit type was conducted due to national recommendations for restrictions regarding the COVID-19 Pandemic (e.g. social distancing) in an effort to limit this patient's exposure and mitigate transmission in our community.  Due to his co-morbid illnesses, this patient is at least at moderate risk for complications without adequate follow up.  This format is felt to be most appropriate for this patient at this time.  All issues noted in this document were discussed and addressed.  A limited physical exam was performed with this format.  Please refer to the patient's chart for his consent to telehealth for [facility].   Evaluation Performed:  Cardiology Consult  This visit type was conducted due to national recommendations for restrictions regarding the COVID-19 Pandemic (e.g. social distancing).  This format is felt to be most appropriate for this patient at this time.  All issues noted in this document were discussed and addressed.  No physical exam was performed (except for noted visual exam findings with Video Visits).  Please refer to the patient's chart (MyChart message for video visits and phone note for telephone visits) for the patient's consent to telehealth for [facility].  Date:  02/16/2019   ID:  Antonio Ayers, DOB Aug 12, 1950, MRN [id number]  Patient Location:  Home  Provider location:   [place]  PCP:  [name], [name], MD Cardiologist:  NEW Electrophysiologist:  None   Chief Complaint: Aortic insuffiency  History of Present Illness:    Antonio Ayers is a 68 y.o. male who presents via audio/video conferencing for a telehealth visit today in referral by Dr. Jerilee Hoh for evaluation of aortic insufficiency.  This is a 68yo male with a hx of ankylosing spondylitis followed by Rheum on once weekly Humira and NSIADs, BPH, glaucoma and AI followed with serial echoes in the past with the last being 2 years ago in  [place].  He was found to have a heart murmur in 2002 and a 2D echo confirmed aortic insufficiency.  He has been followed with yearly echo by his Cardiologist.   He does not know if he has an aortic aneurysm but has had Cardiac MRI or Chest CTA yearly as well and his MD told him his BP needed to be well controlled.  He is here to establish with a new cardiologist.  He had been on Losartan for BP controlled but had to be stopped due to soft Bp.  He denies any chest pain or pressure, SOB, DOE, PND, orthopnea, LE edema, palpitations. He admits to occasional dizziness but has not had any syncope and usually occurs going from sitting to standing. He is compliant with his meds and is tolerating meds with no SE.    The patient does not have symptoms concerning for COVID-19 infection (fever, chills, cough, or new shortness of breath).   Prior CV studies:   The following studies were reviewed today:  none  Past Medical History:  Diagnosis Date  . Ankylosing spondylitis ([place])   . Aortic insufficiency   . Nephrolithiasis    Past Surgical History:  Procedure Laterality Date  . CATARACT EXTRACTION Left 2016  . CHOLECYSTECTOMY  01/2018  . [place], 2014  . HERNIA REPAIR  2018  . HIATAL HERNIA REPAIR    . KIDNEY STONE SURGERY  03/2018     Current Meds  Medication Sig  . Adalimumab (HUMIRA) 40 MG/0.4ML PSKT Inject 40 mg into the skin every 7 (seven) days.  Marland Kitchen  Cholecalciferol (D3-1000) 25 MCG (1000 UT) capsule Take 1,000 Units by mouth daily.   . COMBIGAN 0.2-0.5 % ophthalmic solution INT 1 GTT IN OU BID  . latanoprost (XALATAN) 0.005 % ophthalmic solution INT 1 GTT IN OS QPM B DINNER  . LOTEMAX 0.5 % GEL INT 1 GTT IN OS D  . Polyethylene Glycol 3350 (MIRALAX PO) Take by mouth as needed.  . potassium citrate (UROCIT-K) 10 MEQ (1080 MG) SR tablet 3 (three) times daily.     Allergies:   Patient has no known allergies.   Social History   Tobacco Use  . Smoking status: Never  Smoker  . Smokeless tobacco: Never Used  Substance Use Topics  . Alcohol use: Yes    Comment: occ  . Drug use: Not Currently     Family Hx: The patient's family history includes Diabetes in his sister; Heart failure in his father; Hiatal hernia in his sister; Obesity in his sister; Stroke in his father and mother.  ROS:   Please see the history of present illness.     All other systems reviewed and are negative.   Labs/Other Tests and Data Reviewed:    Recent Labs: 12/19/2018: TSH 1.59 01/12/2019: ALT 21; BUN 12; Creatinine 0.74; Hemoglobin 15.6; Platelet Count 176; Potassium 4.5; Sodium 138   Recent Lipid Panel No results found for: CHOL, TRIG, HDL, CHOLHDL, LDLCALC, LDLDIRECT  Wt Readings from Last 3 Encounters:  02/16/19 202 lb (91.6 kg)  01/12/19 217 lb 3.2 oz (98.5 kg)  01/09/19 218 lb 4.8 oz (99 kg)     Objective:    Vital Signs:  Temp 97.6 F (36.4 C)   Ht 6\' 1"  (1.854 m)   Wt 202 lb (91.6 kg)   BMI 26.65 kg/m    CONSTITUTIONAL:  Well nourished, well developed male in no acute distress.  EYES: anicteric MOUTH: oral mucosa is pink RESPIRATORY: Normal respiratory effort, symmetric expansion CARDIOVASCULAR: No peripheral edema SKIN: No rash, lesions or ulcers MUSCULOSKELETAL: no digital cyanosis NEURO: Cranial Nerves II-XII grossly intact, moves all extremities PSYCH: Intact judgement and insight.  A&O x 3, Mood/affect appropriate   ASSESSMENT & PLAN:    1.  Aortic Insufficiency -apparently this was dx in [place] and his last echo was 2 years ago -I do not have any echo or old records to review -I will get his old records of his echos, cardiac MRI  and Chest CT -he is not sure if he also has an aortic aneurysm -I will get a 2D echo to reassess and also determine if he needs another Chest CT -he had been on Losartan for BP control at one point but this was stopped as BP became soft -I have encouraged him to get a BP cuff to check his BP on occasion  2.   Chronic DOE -he says that this is from chronic back problems from his AK and he says it is stable and unchanged.   COVID-19 Education: The signs and symptoms of COVID-19 were discussed with the patient and how to seek care for testing (follow up with PCP or arrange E-visit).  The importance of social distancing was discussed today.  Patient Risk:   After full review of this patient's clinical status, I feel that they are at least moderate risk at this time.  Time:   Today, I have spent 20 minutes  on telemedicine discussing medical problems including aortic insufficiency.  We also reviewed the symptoms of COVID 19 and the ways to protect  against contracting the virus with telehealth technology.  I spent an additional 5 minutes reviewing patient's chart including OV notes from PCP.  Medication Adjustments/Labs and Tests Ordered: Current medicines are reviewed at length with the patient today.  Concerns regarding medicines are outlined above.  Tests Ordered: No orders of the defined types were placed in this encounter.  Medication Changes: No orders of the defined types were placed in this encounter.   Disposition:  Follow up in 6 months  Signed, [name], MD  02/16/2019 10:14 AM    [place]

## 2019-02-15 NOTE — Telephone Encounter (Signed)
New Message ° ° ° °Left message to confirm appt and get consent  °

## 2019-02-15 NOTE — Telephone Encounter (Signed)

## 2019-02-16 ENCOUNTER — Telehealth: Payer: Self-pay | Admitting: Cardiology

## 2019-02-16 ENCOUNTER — Other Ambulatory Visit: Payer: Self-pay

## 2019-02-16 ENCOUNTER — Encounter: Payer: Self-pay | Admitting: Cardiology

## 2019-02-16 ENCOUNTER — Telehealth (INDEPENDENT_AMBULATORY_CARE_PROVIDER_SITE_OTHER): Payer: Medicare Other | Admitting: Cardiology

## 2019-02-16 VITALS — Temp 97.6°F | Ht 73.0 in | Wt 202.0 lb

## 2019-02-16 DIAGNOSIS — I351 Nonrheumatic aortic (valve) insufficiency: Secondary | ICD-10-CM

## 2019-02-16 DIAGNOSIS — R0602 Shortness of breath: Secondary | ICD-10-CM

## 2019-02-16 NOTE — Telephone Encounter (Signed)
Scheduled patient's echo next Friday.  He was grateful for assistance.

## 2019-02-16 NOTE — Patient Instructions (Addendum)
Medication Instructions:  Your provider recommends that you continue on your current medications as directed. Please refer to the Current Medication list given to you today.    Labwork: None  Testing/Procedures: Your provider has requested that you have an echocardiogram. Echocardiography is a painless test that uses sound waves to create images of your heart. It provides your doctor with information about the size and shape of your heart and how well your heart's chambers and valves are working. This procedure takes approximately one hour. There are no restrictions for this procedure.  Follow-Up: Your provider wants you to follow-up in: 6 months with Dr. Radford Pax. You will receive a reminder letter in the mail two months in advance. If you don't receive a letter, please call our office to schedule the follow-up appointment.

## 2019-02-16 NOTE — Telephone Encounter (Signed)
Gerome Sam RN is working with Dr. Radford Pax today.

## 2019-02-16 NOTE — Telephone Encounter (Signed)
F/U Message                  Patient is turning someone's call, [name]) is who he said called him.

## 2019-02-19 ENCOUNTER — Telehealth: Payer: Self-pay

## 2019-02-19 NOTE — Telephone Encounter (Signed)
Fax a request for echo, chest CTA and cardiacMRI to dr Lottie Rater MD office. Phone [phone], fax [phone]

## 2019-02-23 ENCOUNTER — Ambulatory Visit (HOSPITAL_COMMUNITY): Payer: Medicare Other | Attending: Internal Medicine

## 2019-02-23 ENCOUNTER — Other Ambulatory Visit: Payer: Self-pay

## 2019-02-23 DIAGNOSIS — I351 Nonrheumatic aortic (valve) insufficiency: Secondary | ICD-10-CM | POA: Insufficient documentation

## 2019-02-27 ENCOUNTER — Telehealth: Payer: Self-pay

## 2019-02-27 ENCOUNTER — Telehealth: Payer: Self-pay | Admitting: *Deleted

## 2019-02-27 NOTE — Telephone Encounter (Signed)
Pt has been notified of echo results by phone with verbal understanding. Pt also stated that records form his cardiologist in [place] was supposed to be sending records for Dr. Radford Pax. I did see the ROI from [place]. I stated was not sure if records have been received yet but I I will send a message to our HIM Dept to make sure we get the records for Dr. Radford Pax. Pt thanked me for the call. The patient has been notified of the result and verbalized understanding.  All questions (if any) were answered. Julaine Hua, [facility] 02/27/2019 9:17 AM

## 2019-02-27 NOTE — Telephone Encounter (Signed)
-----   Message from Nuala Alpha, LPN sent at 10/30/4008  1:47 PM EDT -----  ----- Message ----- From: Sarina Ill, RN Sent: 02/23/2019   5:14 PM EDT To: [address] Triage   ----- Message ----- From: Sueanne Margarita, MD Sent: 02/23/2019   4:31 PM EDT To: Erline Hau, MD, #  2D echo showed hyperdynamic LVF with mildly increased wall thickness and increased stiffness of heart muscle.

## 2019-02-27 NOTE — Telephone Encounter (Signed)
CALLED DR MENDELSON OFFICE TO CHECK ON REQUEST FOR NOTES, PERSON THAT I SPOKE WITH SAID THAT SHE WOULD SEND OVER A REQUEST ASKING THEM TO SEND THEM OVER.

## 2019-03-07 ENCOUNTER — Encounter: Payer: Self-pay | Admitting: Internal Medicine

## 2019-03-08 ENCOUNTER — Encounter: Payer: Self-pay | Admitting: Cardiology

## 2019-03-08 DIAGNOSIS — I719 Aortic aneurysm of unspecified site, without rupture: Secondary | ICD-10-CM | POA: Insufficient documentation

## 2019-03-13 ENCOUNTER — Telehealth: Payer: Self-pay | Admitting: *Deleted

## 2019-03-13 DIAGNOSIS — Q2543 Congenital aneurysm of aorta: Secondary | ICD-10-CM

## 2019-03-13 DIAGNOSIS — Q2549 Other congenital malformations of aorta: Secondary | ICD-10-CM

## 2019-03-13 DIAGNOSIS — I712 Thoracic aortic aneurysm, without rupture, unspecified: Secondary | ICD-10-CM

## 2019-03-13 DIAGNOSIS — Z01812 Encounter for preprocedural laboratory examination: Secondary | ICD-10-CM

## 2019-03-13 NOTE — Telephone Encounter (Signed)
Pam I do not know if pt needs COVID testing prior to CT appt for the hospital, this test is only done at hospital. CT I think would just need to be CT Chest Angio Aorta. Then in the comments Gated CTA . Chyrel Masson  I will call Radiology department at [facility] to verify.

## 2019-03-13 NOTE — Telephone Encounter (Signed)
Message Received: 5 days ago Message Contents  Turner, Eber Hong, MD  [place] Triage        Please tell patient that I received his Chest CTA from [facility] showing a mild to moderate aortic aneurysm at the sinus ov Valsalva at 56mm. This scan was done 04/28/2018. Please repeat gated Chest CTA 04/2019 to evaluate for stability.    Will contact CT department to determine appropriate imaging code.

## 2019-03-14 MED ORDER — METOPROLOL TARTRATE 100 MG PO TABS: 100.0000 mg | ORAL_TABLET | Freq: Once | ORAL | 0 refills | Status: DC

## 2019-03-14 NOTE — Telephone Encounter (Signed)
Spoke with Lisabeth Pick [phone] who instructed to order this CT as Coronary CT with FFR in case it is needed.  If not needed the FFR will be cancelled.  Pt does not need COVID testing but will need BMP prior to.  Will also need Metoprolol tartrate 100 mg X 1 ordered d/t HR of 88 bpm.   You will be contacted to be scheduled for this testing.  You will need blood work before to check your kidney functions which will be scheduled at the same time the CT is scheduled.  A one time script of Lopressor 100 mg will be sent to your pharmacy to take 2 hours before the CT.  Your cardiac CT will be scheduled at:  [facility] [address] [place], [place] [address] [phone]  Please arrive at the [facility] main entrance of [facility] 30-45 minutes prior to test start time. Proceed to the [facility] Radiology Department (first floor) to check-in and test prep.  Please follow these instructions carefully (unless otherwise directed):  Hold all erectile dysfunction medications at least 48 hours prior to test.  On the Night Before the Test: . Be sure to Drink plenty of water. . Do not consume any caffeinated/decaffeinated beverages or chocolate 12 hours prior to your test. . Do not take any antihistamines 12 hours prior to your test. . If you take Metformin do not take 24 hours prior to test.  On the Day of the Test: . Drink plenty of water. Do not drink any water within one hour of the test. . Do not eat any food 4 hours prior to the test. . You may take your regular medications prior to the test.  . Take metoprolol (Lopressor) 100 mg  two hours prior to test. . HOLD Furosemide/Hydrochlorothiazide morning of the test.      After the Test: . Drink plenty of water. . After receiving IV contrast, you may experience a mild flushed feeling. This is normal. . On occasion, you may experience a mild rash up to 24 hours after the test. This is not dangerous. If this  occurs, you can take Benadryl 25 mg and increase your fluid intake. . If you experience trouble breathing, this can be serious. If it is severe call 911 IMMEDIATELY. If it is mild, please call our office. . If you take any of these medications: Glipizide/Metformin, Avandament, Glucavance, please do not take 48 hours after completing test.  These instructions were send to patient via [place].

## 2019-03-15 NOTE — Telephone Encounter (Signed)
I spoke to the patient and reviewed instructions for Coronary CT.Marland Kitchen  He verbalized understanding.

## 2019-03-15 NOTE — Telephone Encounter (Signed)
Follow Up   Patient calling in with questions/concerns about the cardiac CT. Patient is wanting a call back to discuss. Please give patient a call back.

## 2019-03-27 ENCOUNTER — Ambulatory Visit (HOSPITAL_COMMUNITY): Payer: Medicare Other

## 2019-03-30 ENCOUNTER — Other Ambulatory Visit: Payer: Self-pay

## 2019-03-30 ENCOUNTER — Other Ambulatory Visit: Payer: Medicare Other | Admitting: *Deleted

## 2019-03-30 DIAGNOSIS — Z01812 Encounter for preprocedural laboratory examination: Secondary | ICD-10-CM

## 2019-03-30 DIAGNOSIS — Q2549 Other congenital malformations of aorta: Secondary | ICD-10-CM

## 2019-03-30 LAB — BASIC METABOLIC PANEL
BUN/Creatinine Ratio: 18 (ref 10–24)
BUN: 12 mg/dL (ref 8–27)
CO2: 23 mmol/L (ref 20–29)
Calcium: 9.1 mg/dL (ref 8.6–10.2)
Chloride: 100 mmol/L (ref 96–106)
Creatinine, Ser: 0.66 mg/dL — ABNORMAL LOW (ref 0.76–1.27)
GFR calc Af Amer: 114 mL/min/{1.73_m2} (ref >59)
GFR calc non Af Amer: 99 mL/min/{1.73_m2} (ref >59)
Glucose: 96 mg/dL (ref 65–99)
Potassium: 4.4 mmol/L (ref 3.5–5.2)
Sodium: 137 mmol/L (ref 134–144)

## 2019-04-03 ENCOUNTER — Telehealth (HOSPITAL_COMMUNITY): Payer: Self-pay | Admitting: Emergency Medicine

## 2019-04-03 NOTE — Telephone Encounter (Signed)
Left message on voicemail with name and callback number Tyasia Packard RN Navigator Cardiac Imaging [facility] [phone] Office [phone] Cell  

## 2019-04-04 ENCOUNTER — Ambulatory Visit (HOSPITAL_COMMUNITY): Payer: Medicare Other

## 2019-04-04 ENCOUNTER — Encounter: Payer: Self-pay | Admitting: Internal Medicine

## 2019-04-05 DIAGNOSIS — R82994 Hypercalciuria: Secondary | ICD-10-CM | POA: Diagnosis not present

## 2019-04-05 DIAGNOSIS — N200 Calculus of kidney: Secondary | ICD-10-CM | POA: Diagnosis not present

## 2019-04-06 ENCOUNTER — Encounter: Payer: Self-pay | Admitting: Rheumatology

## 2019-04-06 DIAGNOSIS — M450 Ankylosing spondylitis of multiple sites in spine: Secondary | ICD-10-CM

## 2019-04-10 ENCOUNTER — Telehealth (HOSPITAL_COMMUNITY): Payer: Self-pay | Admitting: Emergency Medicine

## 2019-04-10 MED ORDER — HUMIRA (2 SYRINGE) 40 MG/0.4ML ~~LOC~~ PSKT: 40.0000 mg | PREFILLED_SYRINGE | SUBCUTANEOUS | 2 refills | Status: DC

## 2019-04-10 NOTE — Telephone Encounter (Signed)
Last Visit: 01/02/19 Next Visit: 06/04/19 Labs: 01/12/19 Glucose 101, Calcium 8.6, total Bilirubin 3.1 TB Gold: 12/19/18 Neg   Okay to refill per Dr. Estanislado Pandy

## 2019-04-10 NOTE — Telephone Encounter (Signed)
Left message on voicemail with name and callback number Burnis Halling RN Navigator Cardiac Imaging [facility] [phone] Office [phone] Cell  

## 2019-04-10 NOTE — Telephone Encounter (Signed)
Pt returned phone call regarding upcoming cardiac imaging study; pt verbalizes understanding of appt date/time, parking situation and where to check in, pre-test NPO status and medications ordered, and verified current allergies; name and call back number provided for further questions should they arise Tedi Hughson RN Navigator Cardiac Imaging [facility] [phone] office [phone] cell    

## 2019-04-11 ENCOUNTER — Other Ambulatory Visit: Payer: Self-pay

## 2019-04-11 ENCOUNTER — Other Ambulatory Visit: Payer: Self-pay | Admitting: Cardiology

## 2019-04-11 ENCOUNTER — Ambulatory Visit (HOSPITAL_COMMUNITY): Payer: Medicare Other

## 2019-04-11 ENCOUNTER — Ambulatory Visit (HOSPITAL_COMMUNITY)
Admission: RE | Admit: 2019-04-11 | Discharge: 2019-04-11 | Disposition: A | Discharge status: Home / Self Care | Payer: Medicare Other | Source: Ambulatory Visit | Attending: Cardiology | Admitting: Cardiology

## 2019-04-11 ENCOUNTER — Telehealth: Payer: Self-pay

## 2019-04-11 DIAGNOSIS — I712 Thoracic aortic aneurysm, without rupture, unspecified: Secondary | ICD-10-CM

## 2019-04-11 DIAGNOSIS — Q2549 Other congenital malformations of aorta: Secondary | ICD-10-CM | POA: Insufficient documentation

## 2019-04-11 MED ORDER — IOHEXOL 350 MG/ML SOLN
100.0000 mL | Freq: Once | INTRAVENOUS | Status: AC | PRN
  Administered 2019-04-11: 10:00:00 100 mL via INTRAVENOUS

## 2019-04-11 MED ORDER — SODIUM CHLORIDE 0.9 % IV BOLUS
250.0000 mL | Freq: Once | INTRAVENOUS | Status: AC
  Administered 2019-04-11: 09:00:00 250 mL via INTRAVENOUS

## 2019-04-11 NOTE — Telephone Encounter (Signed)
Notes recorded by Frederik Schmidt, RN on 04/11/2019 at 4:30 PM EDT  Lpm 9/2  ------

## 2019-04-11 NOTE — Telephone Encounter (Signed)
-----   Message from Sueanne Margarita, MD sent at 04/11/2019  4:17 PM EDT ----- Chest CT angio showed mildly dilated aortic root at the sinuses of Valsalva up to 3.9cm.  Check 2D echo in 1 year to followup.

## 2019-04-12 NOTE — Telephone Encounter (Signed)
I spoke to the patient about results.  He verbalized understanding.

## 2019-04-12 NOTE — Telephone Encounter (Signed)
Follow up   Patient is returning call for CT Results. Please call.

## 2019-04-17 ENCOUNTER — Telehealth: Payer: Self-pay | Admitting: Pharmacist

## 2019-04-17 NOTE — Telephone Encounter (Signed)
Received notification via fax from [facility] stating that application for patient was approved and under review.  Will update when we receive a response.  Phone # [phone]   Mariella Saa, PharmD, [place], [place] Clinical Specialty Pharmacist [phone]  04/17/2019 8:26 AM

## 2019-04-24 ENCOUNTER — Encounter: Payer: Self-pay | Admitting: Rheumatology

## 2019-04-26 DIAGNOSIS — N200 Calculus of kidney: Secondary | ICD-10-CM | POA: Diagnosis not present

## 2019-04-27 ENCOUNTER — Other Ambulatory Visit: Payer: Self-pay

## 2019-04-27 DIAGNOSIS — Z79899 Other long term (current) drug therapy: Secondary | ICD-10-CM | POA: Diagnosis not present

## 2019-04-28 LAB — CBC WITH DIFFERENTIAL/PLATELET
Absolute Monocytes: 774 cells/uL (ref 200–950)
Basophils Absolute: 37 cells/uL (ref 0–200)
Basophils Relative: 0.5 %
Eosinophils Absolute: 168 cells/uL (ref 15–500)
Eosinophils Relative: 2.3 %
HCT: 44 % (ref 38.5–50.0)
Hemoglobin: 15.5 g/dL (ref 13.2–17.1)
Lymphs Abs: 1701 cells/uL (ref 850–3900)
MCH: 32 pg (ref 27.0–33.0)
MCHC: 35.2 g/dL (ref 32.0–36.0)
MCV: 90.7 fL (ref 80.0–100.0)
MPV: 9.6 fL (ref 7.5–12.5)
Monocytes Relative: 10.6 %
Neutro Abs: 4621 cells/uL (ref 1500–7800)
Neutrophils Relative %: 63.3 %
Platelets: 184 10*3/uL (ref 140–400)
RBC: 4.85 10*6/uL (ref 4.20–5.80)
RDW: 12.9 % (ref 11.0–15.0)
Total Lymphocyte: 23.3 %
WBC: 7.3 10*3/uL (ref 3.8–10.8)

## 2019-04-28 LAB — COMPLETE METABOLIC PANEL WITH GFR
AG Ratio: 1.8 (calc) (ref 1.0–2.5)
ALT: 14 U/L (ref 9–46)
AST: 18 U/L (ref 10–35)
Albumin: 4.1 g/dL (ref 3.6–5.1)
Alkaline phosphatase (APISO): 59 U/L (ref 35–144)
BUN/Creatinine Ratio: 16 (calc) (ref 6–22)
BUN: 11 mg/dL (ref 7–25)
CO2: 23 mmol/L (ref 20–32)
Calcium: 8.9 mg/dL (ref 8.6–10.3)
Chloride: 102 mmol/L (ref 98–110)
Creat: 0.67 mg/dL — ABNORMAL LOW (ref 0.70–1.25)
GFR, Est African American: 114 mL/min/{1.73_m2} (ref >=60)
GFR, Est Non African American: 98 mL/min/{1.73_m2} (ref >=60)
Globulin: 2.3 g/dL (calc) (ref 1.9–3.7)
Glucose, Bld: 81 mg/dL (ref 65–99)
Potassium: 3.6 mmol/L (ref 3.5–5.3)
Sodium: 139 mmol/L (ref 135–146)
Total Bilirubin: 3.3 mg/dL — ABNORMAL HIGH (ref 0.2–1.2)
Total Protein: 6.4 g/dL (ref 6.1–8.1)

## 2019-04-30 NOTE — Progress Notes (Signed)
CMP stable. CBC WNL.

## 2019-05-07 NOTE — Telephone Encounter (Signed)
Received approval for Patient Assistance from [facility]ist for Humira. Patient has been approved for assistance through 08/08/20.  Will send document to scan center.  Phone#[phone]  11:42 AM Beatriz Chancellor, CPhT

## 2019-05-21 NOTE — Progress Notes (Signed)
Office Visit Note  Patient: Antonio Ayers             Date of Birth: Jan 13, 1951           MRN: [id number]             PCP: [name], [name], MD Referring: [name], [place]* Visit Date: 06/04/2019 Occupation: @GUAROCC @  Subjective:  Morning stiffness    History of Present Illness: Antonio Ayers is a 68 y.o. male with history of ankylosing spondylitis.  He is on Humira 40 mg sq injections every 7 days. He has been on this dosing for over 1 year, which was started by his rheumatologist in [place]. He has not had any recent infections.  He has not missed any doses recently.  He denies any recent flares.  He continues on morning stiffness lasting 5 to 10 minutes.  He states that occasional SI joint pain if he walks long distances on concrete.  He has occasional left hip pain.  He has not noticed any increased joint stiffness or discomfort.  He denies any joint swelling at this time.  He denies any Achilles tendinitis or plantar fasciitis.    Activities of Daily Living:  Patient reports morning stiffness for 5-10 minutes.   Patient Denies nocturnal pain.  Difficulty dressing/grooming: Denies Difficulty climbing stairs: Denies Difficulty getting out of chair: Reports Difficulty using hands for taps, buttons, cutlery, and/or writing: Denies  Review of Systems  Constitutional: Negative for fatigue.  HENT: Positive for mouth dryness. Negative for mouth sores and nose dryness.   Eyes: Negative for itching and dryness.  Respiratory: Negative for shortness of breath and difficulty breathing.   Cardiovascular: Negative for chest pain and palpitations.  Gastrointestinal: Negative for blood in stool, constipation and diarrhea.  Endocrine: Negative for increased urination.  Genitourinary: Negative for painful urination.  Musculoskeletal: Positive for arthralgias, joint pain and morning stiffness. Negative for joint swelling.  Skin: Negative for rash.    Allergic/Immunologic: Negative for susceptible to infections.  Neurological: Positive for dizziness and headaches. Negative for numbness, memory loss and weakness.  Hematological: Negative for bruising/bleeding tendency.  Psychiatric/Behavioral: Positive for sleep disturbance. Negative for confusion.    PMFS History:  Patient Active Problem List   Diagnosis Date Noted   Aortic aneurysm ([place])    Aortic valve regurgitation 01/09/2019   Nephrolithiasis 01/09/2019   BPH (benign prostatic hyperplasia) 01/09/2019   Overweight (BMI 25.0-29.9) 01/09/2019   Primary osteoarthritis of both hips 12/29/2018   Primary osteoarthritis of both knees 12/29/2018   Raised intraocular pressure of both eyes 12/19/2018   Ankylosing spondylitis of multiple sites in spine ([place]) 12/19/2018    Past Medical History:  Diagnosis Date   Ankylosing spondylitis (HCC)    Aortic aneurysm (H[facility])    mild to moderate aortic aneurysm at the sinus ov Valsalva at 11mm.  This scan was done 04/28/2018.   Aortic insufficiency    Nephrolithiasis     Family History  Problem Relation Age of Onset   Stroke Mother    Stroke Father    Heart failure Father    Diabetes Sister    Obesity Sister    Hiatal hernia Sister    Past Surgical History:  Procedure Laterality Date   CATARACT EXTRACTION Left 2016   CHOLECYSTECTOMY  01/2018   CORNEAL TRANSPLANT  1989, 1992, 2014   [place]  2018   [place] SURGERY  03/2018   Social History  Social History Narrative   Not on file   Immunization History  Administered Date(s) Administered   Pneumococcal Conjugate-13 02/22/2019   Zoster Recombinat (Shingrix) 02/22/2019     Objective: Vital Signs: BP 98/70 (BP Location: Left Arm, Patient Position: Sitting, Cuff Size: Normal)    Pulse 86    Resp 15    Ht 6\' 1"  (1.854 m)    Wt 219 lb (99.3 kg)    BMI 28.89 kg/m    Physical Exam Vitals signs and nursing note  reviewed.  Constitutional:      Appearance: He is well-developed.  HENT:     Head: Normocephalic and atraumatic.  Eyes:     Conjunctiva/sclera: Conjunctivae normal.     Pupils: Pupils are equal, round, and reactive to light.  Neck:     Musculoskeletal: Normal range of motion and neck supple.  Cardiovascular:     Rate and Rhythm: Normal rate and regular rhythm.     Heart sounds: Normal heart sounds.  Pulmonary:     Effort: Pulmonary effort is normal.     Breath sounds: Normal breath sounds.  Abdominal:     General: Bowel sounds are normal.     Palpations: Abdomen is soft.  Skin:    General: Skin is warm and dry.     Capillary Refill: Capillary refill takes less than 2 seconds.  Neurological:     Mental Status: He is alert and oriented to person, place, and time.  Psychiatric:        Behavior: Behavior normal.      Musculoskeletal Exam: Very limited ROM of C-spine, thoracic spine, and lumbar spine. Shoulder joint abduction slightly limited.  Elbow joints, wrist joints, MCPs, PIPs, and DIPs good ROM with no synovitis.  Very limited ROM of both hip joints.  Limited extension of both knee joints, especially the right knee joint.  No warmth or effusion of knee joints. Ankle joints good ROM with no discomfort.  No warmth or tenderness of ankle joints.  No achilles tendonitis or plantar fasciitis.    CDAI Exam: CDAI Score: -- Patient Global: --; Provider Global: -- Swollen: --; Tender: -- Joint Exam   No joint exam has been documented for this visit   There is currently no information documented on the homunculus. Go to the Rheumatology activity and complete the homunculus joint exam.  Investigation: No additional findings.  Imaging: No results found.  Recent Labs: Lab Results  Component Value Date   WBC 7.3 04/27/2019   HGB 15.5 04/27/2019   PLT 184 04/27/2019   NA 139 04/27/2019   K 3.6 04/27/2019   CL 102 04/27/2019   CO2 23 04/27/2019   GLUCOSE 81 04/27/2019   BUN  11 04/27/2019   CREATININE 0.67 (L) 04/27/2019   BILITOT 3.3 (H) 04/27/2019   ALKPHOS 65 01/12/2019   AST 18 04/27/2019   ALT 14 04/27/2019   PROT 6.4 04/27/2019   ALBUMIN 4.1 01/12/2019   CALCIUM 8.9 04/27/2019   GFRAA 114 04/27/2019   QFTBGOLDPLUS NEGATIVE 12/19/2018    Speciality Comments: No specialty comments available.  Procedures:  No procedures performed Allergies: Patient has no known allergies.   Assessment / Plan:     Visit Diagnoses: Ankylosing spondylitis of multiple sites in spine [facility]) -  Diagnosed in [place].  Last seen by rheumatologist June 07, 2018: He has not had any signs or symptoms of a flare recently.  He is clinically doing well on Humira 40 mg subcutaneous injections once weekly.  He  has been injecting Humira on a weekly basis for over 1 year.  We discussed the risks of frequent long-term dosing.  He was advised to start injecting Humira every 10 days for the next several doses and if he tolerates this dose he will increase to every 14 days.  He was advised to notify us if he develops increased joint pain or joint inflammation while spacing the doses.  He has very limited range of motion of the C-spine, thoracic spine, and lumbar spine.  He has no midline spinal tenderness.  No SI joint tenderness on exam.  He has occasional SI joint pain if he walks for prolonged distances on concrete.  He has very limited range of motion of bilateral hip joints with no discomfort on exam.  He will follow-up in the office in 5 months.    High risk medication use - NSAIDs for 3 to 4 years followed by Humira for 2 years.  Currently on Humira 40 mg sq weekly injections.  He is going to try to start injecting Humira every 10 days for the next several doses, and if he tolerates this dosing he will resume injecting every 14 days.  Last TB gold negative 12/19/2018 and will monitor yearly.  Most recent CBC/CMP within normal limits except for low creatinine on 04/27/2019 and will monitor  every 3 months.  He was reminded to have yearly skin exams while on Humira.  We also discussed that if he develops any signs or symptoms of an infection the is to hold Humira until the infection is completely cleared.  Abnormal SPEP - He was evaluated by hematology.  He has a follow-up visit scheduled on 07/16/2019.  Primary osteoarthritis of both hips - Bilateral severe osteoarthritis: He has very limited range of motion of bilateral hip joints.  He experiences occasional left hip and groin pain.  He did not have any discomfort on examination today.  Primary osteoarthritis of both knees - Bilateral moderate osteoarthritis and severe chondromalacia patella: He has limited flexion extension of the right knee joint.  Right knee joint has synovial thickening.  He has bilateral knee crepitus.  No warmth or effusion was noted.  Other medical conditions are listed as follows:  Raised intraocular pressure of both eyes  Other fatigue  Orders: No orders of the defined types were placed in this encounter.  No orders of the defined types were placed in this encounter.     Follow-Up Instructions: Return in about 5 months (around 11/02/2019) for Ankylosing Spondylitis, Osteoarthritis.   Ofilia Neas, PA-C   I examined and evaluated the patient with Hazel Sams PA.  Patient has been on weekly Humira for over a year which was prescribed by another rheumatologist.  We discussed dispensing Humira today to every 10 days and if tolerated to every 2 weeks.  The plan of care was discussed as noted above.  Bo Merino, MD  Note - This record has been created using Editor, commissioning.  Chart creation errors have been sought, but may not always  have been located. Such creation errors do not reflect on  the standard of medical care.

## 2019-06-04 ENCOUNTER — Ambulatory Visit (INDEPENDENT_AMBULATORY_CARE_PROVIDER_SITE_OTHER): Payer: Medicare Other | Admitting: Rheumatology

## 2019-06-04 ENCOUNTER — Encounter: Payer: Self-pay | Admitting: Rheumatology

## 2019-06-04 ENCOUNTER — Other Ambulatory Visit: Payer: Self-pay

## 2019-06-04 VITALS — BP 98/70 | HR 86 | Resp 15 | Ht 73.0 in | Wt 219.0 lb

## 2019-06-04 DIAGNOSIS — M170 Bilateral primary osteoarthritis of knee: Secondary | ICD-10-CM | POA: Diagnosis not present

## 2019-06-04 DIAGNOSIS — M450 Ankylosing spondylitis of multiple sites in spine: Secondary | ICD-10-CM

## 2019-06-04 DIAGNOSIS — R5383 Other fatigue: Secondary | ICD-10-CM

## 2019-06-04 DIAGNOSIS — H40053 Ocular hypertension, bilateral: Secondary | ICD-10-CM

## 2019-06-04 DIAGNOSIS — R778 Other specified abnormalities of plasma proteins: Secondary | ICD-10-CM | POA: Diagnosis not present

## 2019-06-04 DIAGNOSIS — Z79899 Other long term (current) drug therapy: Secondary | ICD-10-CM

## 2019-06-04 DIAGNOSIS — M160 Bilateral primary osteoarthritis of hip: Secondary | ICD-10-CM

## 2019-06-04 NOTE — Patient Instructions (Addendum)
Standing Labs We placed an order today for your standing lab work.    Please come back and get your standing labs in December and every 3 months   CBC and CMP   We have open lab daily Monday through Thursday from 8:30-12:30 PM and 1:30-4:30 PM and Friday from 8:30-12:30 PM and 1:30-4:00 PM at the office of Dr. Bo Merino.   You may experience shorter wait times on Monday and Friday afternoons. The office is located at [address], [place], [place], [place] [address] No appointment is necessary.   Labs are drawn by [facility].  You may receive a bill from [facility] for your lab work.  If you wish to have your labs drawn at another location, please call the office 24 hours in advance to send orders.  If you have any questions regarding directions or hours of operation,  please call [phone].   Just as a reminder please drink plenty of water prior to coming for your lab work. Thanks!

## 2019-06-14 ENCOUNTER — Encounter: Payer: Medicare Other | Admitting: Internal Medicine

## 2019-06-22 ENCOUNTER — Encounter: Payer: Self-pay | Admitting: Internal Medicine

## 2019-06-22 ENCOUNTER — Other Ambulatory Visit: Payer: Self-pay

## 2019-06-22 ENCOUNTER — Ambulatory Visit (INDEPENDENT_AMBULATORY_CARE_PROVIDER_SITE_OTHER): Payer: Medicare Other | Admitting: Internal Medicine

## 2019-06-22 ENCOUNTER — Telehealth: Payer: Self-pay | Admitting: Hematology and Oncology

## 2019-06-22 VITALS — BP 100/70 | HR 78 | Temp 97.8°F | Ht 73.0 in | Wt 217.2 lb

## 2019-06-22 DIAGNOSIS — Z1283 Encounter for screening for malignant neoplasm of skin: Secondary | ICD-10-CM

## 2019-06-22 DIAGNOSIS — Z1211 Encounter for screening for malignant neoplasm of colon: Secondary | ICD-10-CM

## 2019-06-22 DIAGNOSIS — I712 Thoracic aortic aneurysm, without rupture, unspecified: Secondary | ICD-10-CM

## 2019-06-22 DIAGNOSIS — H40053 Ocular hypertension, bilateral: Secondary | ICD-10-CM

## 2019-06-22 DIAGNOSIS — Z0000 Encounter for general adult medical examination without abnormal findings: Secondary | ICD-10-CM

## 2019-06-22 DIAGNOSIS — L609 Nail disorder, unspecified: Secondary | ICD-10-CM

## 2019-06-22 DIAGNOSIS — M450 Ankylosing spondylitis of multiple sites in spine: Secondary | ICD-10-CM | POA: Diagnosis not present

## 2019-06-22 DIAGNOSIS — N200 Calculus of kidney: Secondary | ICD-10-CM

## 2019-06-22 DIAGNOSIS — N400 Enlarged prostate without lower urinary tract symptoms: Secondary | ICD-10-CM

## 2019-06-22 LAB — COMPREHENSIVE METABOLIC PANEL
ALT: 19 U/L (ref 0–53)
AST: 17 U/L (ref 0–37)
Albumin: 4.5 g/dL (ref 3.5–5.2)
Alkaline Phosphatase: 68 U/L (ref 39–117)
BUN: 13 mg/dL (ref 6–23)
CO2: 29 mEq/L (ref 19–32)
Calcium: 9.2 mg/dL (ref 8.4–10.5)
Chloride: 101 mEq/L (ref 96–112)
Creatinine, Ser: 0.69 mg/dL (ref 0.40–1.50)
GFR: 113.56 mL/min (ref >60.00)
Glucose, Bld: 110 mg/dL — ABNORMAL HIGH (ref 70–99)
Potassium: 4.2 mEq/L (ref 3.5–5.1)
Sodium: 139 mEq/L (ref 135–145)
Total Bilirubin: 3.5 mg/dL — ABNORMAL HIGH (ref 0.2–1.2)
Total Protein: 6.9 g/dL (ref 6.0–8.3)

## 2019-06-22 LAB — CBC WITH DIFFERENTIAL/PLATELET
Basophils Absolute: 0 10*3/uL (ref 0.0–0.1)
Basophils Relative: 0.5 % (ref 0.0–3.0)
Eosinophils Absolute: 0.1 10*3/uL (ref 0.0–0.7)
Eosinophils Relative: 1.1 % (ref 0.0–5.0)
HCT: 45.6 % (ref 39.0–52.0)
Hemoglobin: 16.3 g/dL (ref 13.0–17.0)
Lymphocytes Relative: 18 % (ref 12.0–46.0)
Lymphs Abs: 1.4 10*3/uL (ref 0.7–4.0)
MCHC: 35.7 g/dL (ref 30.0–36.0)
MCV: 91.7 fl (ref 78.0–100.0)
Monocytes Absolute: 0.8 10*3/uL (ref 0.1–1.0)
Monocytes Relative: 10.3 % (ref 3.0–12.0)
Neutro Abs: 5.6 10*3/uL (ref 1.4–7.7)
Neutrophils Relative %: 70.1 % (ref 43.0–77.0)
Platelets: 193 10*3/uL (ref 150.0–400.0)
RBC: 4.97 Mil/uL (ref 4.22–5.81)
RDW: 13.6 % (ref 11.5–15.5)
WBC: 7.9 10*3/uL (ref 4.0–10.5)

## 2019-06-22 LAB — LIPID PANEL
Cholesterol: 136 mg/dL (ref 0–200)
HDL: 63 mg/dL (ref >39.00)
LDL Cholesterol: 62 mg/dL (ref 0–99)
NonHDL: 72.87
Total CHOL/HDL Ratio: 2
Triglycerides: 56 mg/dL (ref 0.0–149.0)
VLDL: 11.2 mg/dL (ref 0.0–40.0)

## 2019-06-22 LAB — HEMOGLOBIN A1C: Hgb A1c MFr Bld: 5 % (ref 4.6–6.5)

## 2019-06-22 LAB — VITAMIN D 25 HYDROXY (VIT D DEFICIENCY, FRACTURES): VITD: 41.37 ng/mL (ref 30.00–100.00)

## 2019-06-22 LAB — TSH: TSH: 0.77 u[IU]/mL (ref 0.35–4.50)

## 2019-06-22 LAB — VITAMIN B12: Vitamin B-12: 265 pg/mL (ref 211–911)

## 2019-06-22 NOTE — Telephone Encounter (Signed)
[facility] transfer to [place]. Confirmed December lab and follow up appointments with patient.

## 2019-06-22 NOTE — Patient Instructions (Signed)
-Nice seeing you today!!  -Lab work today; will notify you once results are available.  -Tetanus vaccine at your pharmacy.  -Schedule follow up in 6 months.   Preventive Care 18 Years and Older, Male Preventive care refers to lifestyle choices and visits with your health care provider that can promote health and wellness. This includes:  A yearly physical exam. This is also called an annual well check.  Regular dental and eye exams.  Immunizations.  Screening for certain conditions.  Healthy lifestyle choices, such as diet and exercise. What can I expect for my preventive care visit? Physical exam Your health care provider will check:  Height and weight. These may be used to calculate body mass index (BMI), which is a measurement that tells if you are at a healthy weight.  Heart rate and blood pressure.  Your skin for abnormal spots. Counseling Your health care provider may ask you questions about:  Alcohol, tobacco, and drug use.  Emotional well-being.  Home and relationship well-being.  Sexual activity.  Eating habits.  History of falls.  Memory and ability to understand (cognition).  Work and work Statistician. What immunizations do I need?  Influenza (flu) vaccine  This is recommended every year. Tetanus, diphtheria, and pertussis (Tdap) vaccine  You may need a Td booster every 10 years. Varicella (chickenpox) vaccine  You may need this vaccine if you have not already been vaccinated. Zoster (shingles) vaccine  You may need this after age 70. Pneumococcal conjugate (PCV13) vaccine  One dose is recommended after age 36. Pneumococcal polysaccharide (PPSV23) vaccine  One dose is recommended after age 28. Measles, mumps, and rubella (MMR) vaccine  You may need at least one dose of MMR if you were born in 1957 or later. You may also need a second dose. Meningococcal conjugate (MenACWY) vaccine  You may need this if you have certain conditions.  Hepatitis A vaccine  You may need this if you have certain conditions or if you travel or work in places where you may be exposed to hepatitis A. Hepatitis B vaccine  You may need this if you have certain conditions or if you travel or work in places where you may be exposed to hepatitis B. Haemophilus influenzae type b (Hib) vaccine  You may need this if you have certain conditions. You may receive vaccines as individual doses or as more than one vaccine together in one shot (combination vaccines). Talk with your health care provider about the risks and benefits of combination vaccines. What tests do I need? Blood tests  Lipid and cholesterol levels. These may be checked every 5 years, or more frequently depending on your overall health.  Hepatitis C test.  Hepatitis B test. Screening  Lung cancer screening. You may have this screening every year starting at age 53 if you have a 30-pack-year history of smoking and currently smoke or have quit within the past 15 years.  Colorectal cancer screening. All adults should have this screening starting at age 65 and continuing until age 49. Your health care provider may recommend screening at age 8 if you are at increased risk. You will have tests every 1-10 years, depending on your results and the type of screening test.  Prostate cancer screening. Recommendations will vary depending on your family history and other risks.  Diabetes screening. This is done by checking your blood sugar (glucose) after you have not eaten for a while (fasting). You may have this done every 1-3 years.  Abdominal aortic aneurysm (  AAA) screening. You may need this if you are a current or former smoker.  Sexually transmitted disease (STD) testing. Follow these instructions at home: Eating and drinking  Eat a diet that includes fresh fruits and vegetables, whole grains, lean protein, and low-fat dairy products. Limit your intake of foods with high amounts of  sugar, saturated fats, and salt.  Take vitamin and mineral supplements as recommended by your health care provider.  Do not drink alcohol if your health care provider tells you not to drink.  If you drink alcohol: ? Limit how much you have to 0-2 drinks a day. ? Be aware of how much alcohol is in your drink. In the [place] one drink equals one 12 oz bottle of beer (355 mL), one 5 oz glass of wine (148 mL), or one 1 oz glass of hard liquor (44 mL). Lifestyle  Take daily care of your teeth and gums.  Stay active. Exercise for at least 30 minutes on 5 or more days each week.  Do not use any products that contain nicotine or tobacco, such as cigarettes, e-cigarettes, and chewing tobacco. If you need help quitting, ask your health care provider.  If you are sexually active, practice safe sex. Use a condom or other form of protection to prevent STIs (sexually transmitted infections).  Talk with your health care provider about taking a low-dose aspirin or statin. What's next?  Visit your health care provider once a year for a well check visit.  Ask your health care provider how often you should have your eyes and teeth checked.  Stay up to date on all vaccines. This information is not intended to replace advice given to you by your health care provider. Make sure you discuss any questions you have with your health care provider. Document Released: 08/22/2015 Document Revised: 07/20/2018 Document Reviewed: 07/20/2018 Elsevier Patient Education  2020 [employer].

## 2019-06-22 NOTE — Addendum Note (Signed)
Addended by: Westley Hummer B on: 06/22/2019 11:41 AM   Modules accepted: Orders

## 2019-06-22 NOTE — Progress Notes (Signed)
Established Patient Office Visit     CC/Reason for Visit: Annual preventive exam and subsequent Medicare wellness visit  HPI: Antonio Ayers is a 68 y.o. male who is coming in today for the above mentioned reasons. Past Medical History is significant for: Ankylosing spondylitis followed by rheumatology, they are trying to decrease his Humira from weekly to bimonthly, BPH and nephrolithiasis followed by urology was recently started on Flomax.  Ever since then he has noted some low blood pressures and dizziness when standing.  History of cholelithiasis and choledocholithiasis status post ERCP and cholecystectomy.  History of glaucoma and bilateral corneal transplants already being followed by ophthalmology locally.  He also has thoracic abdominal aneurysm followed by cardiology.  He had a recent CT scan and echocardiogram.  He has no acute complaints today.  He has routine eye and dental care.  Does not perceive any hearing issues, he tries to walk every day.  He had a colonoscopy in 2016.  Is due for tetanus vaccination.   Past Medical/Surgical History: Past Medical History:  Diagnosis Date   Ankylosing spondylitis ([place])    Aortic aneurysm (HCC)    mild to moderate aortic aneurysm at the sinus ov Valsalva at 76m.  This scan was done 04/28/2018.   Aortic insufficiency    Nephrolithiasis     Past Surgical History:  Procedure Laterality Date   CATARACT EXTRACTION Left 2016   CHOLECYSTECTOMY  01/2018   CORNEAL TRANSPLANT  1989, 1992, 2014   HERNIA REPAIR  2018   HIATAL HERNIA REPAIR     KIDNEY STONE SURGERY  03/2018    Social History:  reports that he has never smoked. He has never used smokeless tobacco. He reports current alcohol use. He reports previous drug use.  Allergies: No Known Allergies  Family History:  Family History  Problem Relation Age of Onset   Stroke Mother    Stroke Father    Heart failure Father    Diabetes Sister    Obesity Sister      Hiatal hernia Sister      Current Outpatient Medications:    Adalimumab (HUMIRA) 40 MG/0.4ML PSKT, Inject 40 mg into the skin every 7 (seven) days., Disp: 4 each, Rfl: 2   Cholecalciferol (D3-1000) 25 MCG (1000 UT) capsule, Take 1,000 Units by mouth daily. , Disp: , Rfl:    COMBIGAN 0.2-0.5 % ophthalmic solution, INT 1 GTT IN OU BID, Disp: , Rfl:    indapamide (LOZOL) 1.25 MG tablet, TK 1 T PO QD, Disp: , Rfl:    latanoprost (XALATAN) 0.005 % ophthalmic solution, INT 1 GTT IN OS QPM B DINNER, Disp: , Rfl:    LOTEMAX 0.5 % GEL, INT 1 GTT IN OS D, Disp: , Rfl:    Polyethylene Glycol 3350 (MIRALAX PO), Take by mouth as needed., Disp: , Rfl:    potassium citrate (UROCIT-K) 10 MEQ (1080 MG) SR tablet, 3 (three) times daily., Disp: , Rfl:    metoprolol tartrate (LOPRESSOR) 100 MG tablet, Take 1 tablet (100 mg total) by mouth once for 1 dose. Take 1 tablet 2 hours before your CT scan (Patient not taking: Reported on 06/04/2019), Disp: 1 tablet, Rfl: 0  Review of Systems:  Constitutional: Denies fever, chills, diaphoresis, appetite change and fatigue.  HEENT: Denies photophobia, eye pain, redness, hearing loss, ear pain, congestion, sore throat, rhinorrhea, sneezing, mouth sores, trouble swallowing, neck pain, neck stiffness and tinnitus.   Respiratory: Denies SOB, DOE, cough, chest tightness,  and wheezing.  Cardiovascular: Denies chest pain, palpitations and leg swelling.  Gastrointestinal: Denies nausea, vomiting, abdominal pain, diarrhea, constipation, blood in stool and abdominal distention.  Genitourinary: Denies dysuria, urgency, frequency, hematuria, flank pain and difficulty urinating.  Endocrine: Denies: hot or cold intolerance, sweats, changes in hair or nails, polyuria, polydipsia. Musculoskeletal: Denies myalgias, back pain, joint swelling, arthralgias and gait problem.  Skin: Denies pallor, rash and wound.  Neurological: Denies dizziness, seizures, syncope, weakness,  light-headedness, numbness and headaches.  Hematological: Denies adenopathy. Easy bruising, personal or family bleeding history  Psychiatric/Behavioral: Denies suicidal ideation, mood changes, confusion, nervousness, sleep disturbance and agitation    Physical Exam: Vitals:   06/22/19 1019  BP: 100/70  Pulse: 78  Temp: 97.8 F (36.6 C)  TempSrc: Temporal  SpO2: 97%  Weight: 217 lb 3.2 oz (98.5 kg)  Height: '6\' 1"'  (1.854 m)    Body mass index is 28.66 kg/m.   Constitutional: NAD, calm, comfortable Eyes: PERRL, lids and conjunctivae normal, wears corrective lenses ENMT: Mucous membranes are moist. Tympanic membrane is pearly white, no erythema or bulging. Neck: normal, supple, no masses, no thyromegaly Respiratory: clear to auscultation bilaterally, no wheezing, no crackles. Normal respiratory effort. No accessory muscle use.  Cardiovascular: Regular rate and rhythm, no murmurs / rubs / gallops. No extremity edema. 2+ pedal pulses. No carotid bruits.  Abdomen: no tenderness, no masses palpated. No hepatosplenomegaly. Bowel sounds positive.  Musculoskeletal: no clubbing / cyanosis. No joint deformity upper and lower extremities. Good ROM, no contractures. Normal muscle tone.  Skin: no rashes, lesions, ulcers. No induration Neurologic: CN 2-12 grossly intact. Sensation intact, DTR normal. Strength 5/5 in all 4.  Psychiatric: Normal judgment and insight. Alert and oriented x 3. Normal mood.    Subsequent Medicare wellness visit   1. Risk factors, based on past  M,S,F -cardiovascular disease risk factors include age, gender, abdominal aneurysm   2.  Physical activities: Walks about a mile every day   3.  Depression/mood:  Stable, not depressed   4.  Hearing:  No perceived issues   5.  ADL's: Independent in all ADLs   6.  Fall risk:  Low fall risk   7.  Home safety: No problems identified   8.  Height weight, and visual acuity: Height and weight as above, visual acuity is  20/50 with each eye independently and together   9.  Counseling:  Advised follow-up with his multiple specialist as scheduled, advised to receive tetanus vaccination at drugstore   10. Lab orders based on risk factors: Laboratory update will be reviewed   11. Referral :  None today   12. Care plan:  Follow-up with me in 6 months   13. Cognitive assessment:  No cognitive impairment   14. Screening: Patient provided with a written and personalized 5-10 year screening schedule in the AVS.   yes   15. Provider List Update:   PCP, cardiologist (Dr. Radford Pax), urologist (Dr. Jeffie Pollock), Rheumatologist (Dr. Estanislado Pandy)  47. Advance Directives: Full code     Office Visit from 06/22/2019 in [place] at [facility]  PHQ-9 Total Score  2      Fall Risk  06/22/2019 01/09/2019  Falls in the past year? 0 0  Number falls in past yr: 0 0  Injury with Fall? 0 0     Impression and Plan:  Encounter for preventive health examination -Has routine eye and dental care. -Needs tetanus vaccination which she will receive at his pharmacy, otherwise immunizations are up-to-date. -Screening labs today. -  Healthy lifestyle discussed in detail. -Had a colonoscopy in 2016 and is a 5-year callback, will refer to GI. -Refer to dermatology for annual skin check. -Had a PSA that was 0.690 in June, follows with urology.  Raised intraocular pressure of both eyes -Has glaucoma followed by ophthalmology.  Ankylosing spondylitis of multiple sites in spine ([place]) -Followed by rheumatology  Nephrolithiasis Benign prostatic hyperplasia without lower urinary tract symptoms -Follows with urology, was recently started on Flomax and ever since then has had low blood pressure and dizziness when standing up.  Have advised that he take Flomax in the evening before bedtime as it is known to cause orthostatic hypotension.  If no improvement he will need to discuss with urology alternative management.  Thoracic aortic  aneurysm without rupture ([place]) -Followed by cardiology.   Patient Instructions  -Nice seeing you today!!  -Lab work today; will notify you once results are available.  -Tetanus vaccine at your pharmacy.  -Schedule follow up in 6 months.   Preventive Care 74 Years and Older, Male Preventive care refers to lifestyle choices and visits with your health care provider that can promote health and wellness. This includes:  A yearly physical exam. This is also called an annual well check.  Regular dental and eye exams.  Immunizations.  Screening for certain conditions.  Healthy lifestyle choices, such as diet and exercise. What can I expect for my preventive care visit? Physical exam Your health care provider will check:  Height and weight. These may be used to calculate body mass index (BMI), which is a measurement that tells if you are at a healthy weight.  Heart rate and blood pressure.  Your skin for abnormal spots. Counseling Your health care provider may ask you questions about:  Alcohol, tobacco, and drug use.  Emotional well-being.  Home and relationship well-being.  Sexual activity.  Eating habits.  History of falls.  Memory and ability to understand (cognition).  Work and work Statistician. What immunizations do I need?  Influenza (flu) vaccine  This is recommended every year. Tetanus, diphtheria, and pertussis (Tdap) vaccine  You may need a Td booster every 10 years. Varicella (chickenpox) vaccine  You may need this vaccine if you have not already been vaccinated. Zoster (shingles) vaccine  You may need this after age 47. Pneumococcal conjugate (PCV13) vaccine  One dose is recommended after age 43. Pneumococcal polysaccharide (PPSV23) vaccine  One dose is recommended after age 61. Measles, mumps, and rubella (MMR) vaccine  You may need at least one dose of MMR if you were born in 1957 or later. You may also need a second  dose. Meningococcal conjugate (MenACWY) vaccine  You may need this if you have certain conditions. Hepatitis A vaccine  You may need this if you have certain conditions or if you travel or work in places where you may be exposed to hepatitis A. Hepatitis B vaccine  You may need this if you have certain conditions or if you travel or work in places where you may be exposed to hepatitis B. Haemophilus influenzae type b (Hib) vaccine  You may need this if you have certain conditions. You may receive vaccines as individual doses or as more than one vaccine together in one shot (combination vaccines). Talk with your health care provider about the risks and benefits of combination vaccines. What tests do I need? Blood tests  Lipid and cholesterol levels. These may be checked every 5 years, or more frequently depending on your overall health.  Hepatitis C test.  Hepatitis B test. Screening  Lung cancer screening. You may have this screening every year starting at age 29 if you have a 30-pack-year history of smoking and currently smoke or have quit within the past 15 years.  Colorectal cancer screening. All adults should have this screening starting at age 30 and continuing until age 63. Your health care provider may recommend screening at age 60 if you are at increased risk. You will have tests every 1-10 years, depending on your results and the type of screening test.  Prostate cancer screening. Recommendations will vary depending on your family history and other risks.  Diabetes screening. This is done by checking your blood sugar (glucose) after you have not eaten for a while (fasting). You may have this done every 1-3 years.  Abdominal aortic aneurysm (AAA) screening. You may need this if you are a current or former smoker.  Sexually transmitted disease (STD) testing. Follow these instructions at home: Eating and drinking  Eat a diet that includes fresh fruits and vegetables, whole  grains, lean protein, and low-fat dairy products. Limit your intake of foods with high amounts of sugar, saturated fats, and salt.  Take vitamin and mineral supplements as recommended by your health care provider.  Do not drink alcohol if your health care provider tells you not to drink.  If you drink alcohol: ? Limit how much you have to 0-2 drinks a day. ? Be aware of how much alcohol is in your drink. In the [place] one drink equals one 12 oz bottle of beer (355 mL), one 5 oz glass of wine (148 mL), or one 1 oz glass of hard liquor (44 mL). Lifestyle  Take daily care of your teeth and gums.  Stay active. Exercise for at least 30 minutes on 5 or more days each week.  Do not use any products that contain nicotine or tobacco, such as cigarettes, e-cigarettes, and chewing tobacco. If you need help quitting, ask your health care provider.  If you are sexually active, practice safe sex. Use a condom or other form of protection to prevent STIs (sexually transmitted infections).  Talk with your health care provider about taking a low-dose aspirin or statin. What's next?  Visit your health care provider once a year for a well check visit.  Ask your health care provider how often you should have your eyes and teeth checked.  Stay up to date on all vaccines. This information is not intended to replace advice given to you by your health care provider. Make sure you discuss any questions you have with your health care provider. Document Released: 08/22/2015 Document Revised: 07/20/2018 Document Reviewed: 07/20/2018 Elsevier Patient Education  2020 [place], MD [facility]

## 2019-06-25 ENCOUNTER — Encounter: Payer: Self-pay | Admitting: Internal Medicine

## 2019-07-03 ENCOUNTER — Telehealth: Payer: Self-pay

## 2019-07-03 DIAGNOSIS — M450 Ankylosing spondylitis of multiple sites in spine: Secondary | ICD-10-CM

## 2019-07-03 MED ORDER — HUMIRA (2 SYRINGE) 40 MG/0.4ML ~~LOC~~ PSKT: 40.0000 mg | PREFILLED_SYRINGE | SUBCUTANEOUS | 2 refills | Status: DC

## 2019-07-03 NOTE — Telephone Encounter (Signed)
Refill request received via fax from [facility].   Last Visit: 06/04/2019 Next Visit:11/02/2019 Labs: 06/22/2019 elevated glucose 110, total bilirubin 3.5 TB Gold: 12/19/2018 negative.   Okay to refill per Dr. Estanislado Pandy.  Refill request has been faxed to [facility].

## 2019-07-12 ENCOUNTER — Telehealth: Payer: Self-pay | Admitting: Hematology and Oncology

## 2019-07-12 NOTE — Telephone Encounter (Signed)
Returned patient's phone call regarding rescheduling an appointment, left a voicemail. 

## 2019-07-13 ENCOUNTER — Telehealth: Payer: Self-pay | Admitting: Hematology and Oncology

## 2019-07-13 NOTE — Telephone Encounter (Signed)
Patient called to reschedule 12/07 and 12/09 appointments, informed patient he will get a call back because I will need further assistance to reschedule these appointments.

## 2019-07-16 ENCOUNTER — Other Ambulatory Visit: Payer: Medicare Other

## 2019-07-17 ENCOUNTER — Encounter: Payer: Self-pay | Admitting: Rheumatology

## 2019-07-17 DIAGNOSIS — M450 Ankylosing spondylitis of multiple sites in spine: Secondary | ICD-10-CM

## 2019-07-17 MED ORDER — HUMIRA (2 SYRINGE) 40 MG/0.4ML ~~LOC~~ PSKT: 40.0000 mg | PREFILLED_SYRINGE | SUBCUTANEOUS | 0 refills | Status: DC

## 2019-07-17 NOTE — Telephone Encounter (Signed)
Last Visit: 06/04/2019 Next Visit: 11/02/2019 Labs: 06/22/2019 elevated glucose, total bilirubin 3.5 TB Gold: 12/19/2018 negative   Okay to refill per Dr. Estanislado Pandy.

## 2019-07-18 ENCOUNTER — Ambulatory Visit: Payer: Medicare Other | Admitting: Hematology and Oncology

## 2019-07-27 ENCOUNTER — Encounter: Payer: Self-pay | Admitting: Rheumatology

## 2019-08-14 ENCOUNTER — Encounter: Payer: Self-pay | Admitting: Internal Medicine

## 2019-08-23 ENCOUNTER — Telehealth (INDEPENDENT_AMBULATORY_CARE_PROVIDER_SITE_OTHER): Payer: Medicare Other | Admitting: Cardiology

## 2019-08-23 ENCOUNTER — Encounter: Payer: Self-pay | Admitting: Cardiology

## 2019-08-23 ENCOUNTER — Other Ambulatory Visit: Payer: Self-pay

## 2019-08-23 VITALS — BP 123/61 | HR 93 | Ht 73.0 in

## 2019-08-23 DIAGNOSIS — I2510 Atherosclerotic heart disease of native coronary artery without angina pectoris: Secondary | ICD-10-CM | POA: Diagnosis not present

## 2019-08-23 DIAGNOSIS — I351 Nonrheumatic aortic (valve) insufficiency: Secondary | ICD-10-CM | POA: Diagnosis not present

## 2019-08-23 DIAGNOSIS — I712 Thoracic aortic aneurysm, without rupture, unspecified: Secondary | ICD-10-CM

## 2019-08-23 NOTE — Progress Notes (Addendum)
Virtual Visit via Telephone Note   This visit type was conducted due to national recommendations for restrictions regarding the COVID-19 Pandemic (e.g. social distancing) in an effort to limit this patient's exposure and mitigate transmission in our community.  Due to his co-morbid illnesses, this patient is at least at moderate risk for complications without adequate follow up.  This format is felt to be most appropriate for this patient at this time.  All issues noted in this document were discussed and addressed.  A limited physical exam was performed with this format.  Please refer to the patient's chart for his consent to telehealth for [facility].   Evaluation Performed:  Follow-up visit  This visit type was conducted due to national recommendations for restrictions regarding the COVID-19 Pandemic (e.g. social distancing).  This format is felt to be most appropriate for this patient at this time.  All issues noted in this document were discussed and addressed.  No physical exam was performed (except for noted visual exam findings with Video Visits).  Please refer to the patient's chart (MyChart message for video visits and phone note for telephone visits) for the patient's consent to telehealth for [facility].  Date:  08/23/2019   ID:  Antonio Ayers, DOB 1950/12/09, MRN [id number]  Patient Location:  Home  Provider location:   [place]  PCP:  [name], [name], MD  Cardiologist:  [name], MD Electrophysiologist:  None   Chief Complaint:  Aortic insuff  History of Present Illness:    Antonio Ayers is a 69 y.o. male who presents via audio/video conferencing for a telehealth visit today.    This is a 69yo male with a hx of ankylosing spondylitis followed by Rheum on once weekly Humira and NSIADs, BPH, glaucoma and AI followed with serial echoes in the past with the last being 2 years ago in [place].  He was found to have a heart murmur in 2002 and a 2D  echo confirmed aortic insufficiency.  He has been followed with yearly echo   He had an echo in 02/2019 showing mild AVSC and trivial AI.  Chest CTA showed mildly dilated sinus of Valsalva at 19mm.  There were also coronary artery calcifications in the LAD.  He is here today for followup and is doing well.  He denies any chest pain or pressure, SOB, DOE, PND, orthopnea, dizziness, palpitations or syncope. He occasionally has some mild LE edema at the end of the day and eats a frozen dinner daily.  He is compliant with his meds and is tolerating meds with no SE.    The patient does not have symptoms concerning for COVID-19 infection (fever, chills, cough, or new shortness of breath).    Prior CV studies:   The following studies were reviewed today:  2D echo  Past Medical History:  Diagnosis Date  . Ankylosing spondylitis ([place])   . Aortic aneurysm (HCC)    mild to moderate aortic aneurysm at the sinus ov Valsalva at 61mm.  This scan was done 04/28/2018.  [facility] Kitchen Aortic insufficiency   . Nephrolithiasis    Past Surgical History:  Procedure Laterality Date  . CATARACT EXTRACTION Left 2016  . CHOLECYSTECTOMY  01/2018  . [place], 2014  . HERNIA REPAIR  2018  . HIATAL HERNIA REPAIR    . KIDNEY STONE SURGERY  03/2018     Current Meds  Medication Sig  . Adalimumab (HUMIRA) 40 MG/0.4ML PSKT Inject 40 mg into the skin  every 14 (fourteen) days.  . Cholecalciferol (D3-1000) 25 MCG (1000 UT) capsule Take 1,000 Units by mouth daily.   . COMBIGAN 0.2-0.5 % ophthalmic solution INT 1 GTT IN OU BID  . indapamide (LOZOL) 1.25 MG tablet TK 1 T PO QD  . latanoprost (XALATAN) 0.005 % ophthalmic solution INT 1 GTT IN OS QPM B DINNER  . LOTEMAX 0.5 % GEL INT 1 GTT IN OS D  . Polyethylene Glycol 3350 (MIRALAX PO) Take by mouth as needed.  . potassium citrate (UROCIT-K) 10 MEQ (1080 MG) SR tablet 3 (three) times daily.     Allergies:   Patient has no known allergies.   Social  History   Tobacco Use  . Smoking status: Never Smoker  . Smokeless tobacco: Never Used  Substance Use Topics  . Alcohol use: Yes    Comment: occ  . Drug use: Not Currently     Family Hx: The patient's family history includes Diabetes in his sister; Heart failure in his father; Hiatal hernia in his sister; Obesity in his sister; Stroke in his father and mother.  ROS:   Please see the history of present illness.     All other systems reviewed and are negative.   Labs/Other Tests and Data Reviewed:    Recent Labs: 06/22/2019: ALT 19; BUN 13; Creatinine, Ser 0.69; Hemoglobin 16.3; Platelets 193.0; Potassium 4.2; Sodium 139; TSH 0.77   Recent Lipid Panel Lab Results  Component Value Date/Time   CHOL 136 06/22/2019 11:05 AM   TRIG 56.0 06/22/2019 11:05 AM   HDL 63.00 06/22/2019 11:05 AM   CHOLHDL 2 06/22/2019 11:05 AM   LDLCALC 62 06/22/2019 11:05 AM    Wt Readings from Last 3 Encounters:  06/22/19 217 lb 3.2 oz (98.5 kg)  06/04/19 219 lb (99.3 kg)  02/16/19 202 lb (91.6 kg)     Objective:    Vital Signs:  BP 123/61   Pulse 93   Ht 6\' 1"  (1.854 m)   SpO2 96%   BMI 28.66 kg/m    ASSESSMENT & PLAN:    1.  Aortic Insufficiency -2D echo 02/2019 with trivial AI  2.  Mildly dilated sinuses of Valsalva  -measuring 3.9cm on Chest CTA 04/2019 -repeat 2D echo 02/2020 -BP controlled  3.  Coronary artery calcifications -coronary calcium noted on chest CT last summer -will get coronary calcium score - he would like to get this scheduled for this when he gets his echo 02/2020 -LDL was at goal at 83 in Nov 2020   COVID-19 Education: The signs and symptoms of COVID-19 were discussed with the patient and how to seek care for testing (follow up with PCP or arrange E-visit).  The importance of social distancing was discussed today.  Patient Risk:   After full review of this patient's clinical status, I feel that they are at least moderate risk at this time.  Time:   Today,  I have spent 20 minutes on telemedicine discussing medical problems including AI, coronary calcifications and dilated aorta.  We also reviewed the symptoms of COVID 19 and the ways to protect against contracting the virus with telehealth technology.  I spent an additional 5 minutes reviewing patient's chart including 2D echo and Chest CT.  Medication Adjustments/Labs and Tests Ordered: Current medicines are reviewed at length with the patient today.  Concerns regarding medicines are outlined above.  Tests Ordered: No orders of the defined types were placed in this encounter.  Medication Changes: No orders of the defined  types were placed in this encounter.   Disposition:  Follow up in 1 year(s)  Signed, [name], MD  08/23/2019 9:43 AM    [facility]

## 2019-08-23 NOTE — Patient Instructions (Addendum)
Medication Instructions:  Your physician recommends that you continue on your current medications as directed. Please refer to the Current Medication list given to you today.  *If you need a refill on your cardiac medications before your next appointment, please call your pharmacy*  Testing/Procedures: Your physician has requested that you have an echocardiogram. Echocardiography is a painless test that uses sound waves to create images of your heart. It provides your doctor with information about the size and shape of your heart and how well your heart's chambers and valves are working. This procedure takes approximately one hour. There are no restrictions for this procedure.  Your physician recommends that you have a calcium CT scan.   Follow-Up: At [facility], you and your health needs are our priority.  As part of our continuing mission to provide you with exceptional heart care, we have created designated Provider Care Teams.  These Care Teams include your primary Cardiologist (physician) and Advanced Practice Providers (APPs -  Physician Assistants and Nurse Practitioners) who all work together to provide you with the care you need, when you need it.  Your next appointment:   1 year(s)  The format for your next appointment:   In Person  Provider:   You may see Dr. [name] or one of the following Advanced Practice Providers on your designated Care Team:    Melina Copa, PA-C  Ermalinda Barrios, PA-C   Other Instructions   Coronary Calcium Scan A coronary calcium scan is an imaging test used to look for deposits of plaque in the inner lining of the blood vessels of the heart (coronary arteries). Plaque is made up of calcium, protein, and fatty substances. These deposits of plaque can partly clog and narrow the coronary arteries without producing any symptoms or warning signs. This puts a person at risk for a heart attack. This test is recommended for people who are at moderate  risk for heart disease. The test can find plaque deposits before symptoms develop. Tell a health care provider about:  Any allergies you have.  All medicines you are taking, including vitamins, herbs, eye drops, creams, and over-the-counter medicines.  Any problems you or family members have had with anesthetic medicines.  Any blood disorders you have.  Any surgeries you have had.  Any medical conditions you have.  Whether you are pregnant or may be pregnant. What are the risks? Generally, this is a safe procedure. However, problems may occur, including:  Harm to a pregnant woman and her unborn baby. This test involves the use of radiation. Radiation exposure can be dangerous to a pregnant woman and her unborn baby. If you are pregnant or think you may be pregnant, you should not have this procedure done.  Slight increase in the risk of cancer. This is because of the radiation involved in the test. What happens before the procedure? Ask your health care provider for any specific instructions on how to prepare for this procedure. You may be asked to avoid products that contain caffeine, tobacco, or nicotine for 4 hours before the procedure. What happens during the procedure?   You will undress and remove any jewelry from your neck or chest.  You will put on a hospital gown.  Sticky electrodes will be placed on your chest. The electrodes will be connected to an electrocardiogram (ECG) machine to record a tracing of the electrical activity of your heart.  You will lie down on a curved bed that is attached to the [place].  You may be given medicine to slow down your heart rate so that clear pictures can be created.  You will be moved into the CT scanner, and the CT scanner will take pictures of your heart. During this time, you will be asked to lie still and hold your breath for 2-3 seconds at a time while each picture of your heart is being taken. The procedure may vary among  health care providers and hospitals. What happens after the procedure?  You can get dressed.  You can return to your normal activities.  It is up to you to get the results of your procedure. Ask your health care provider, or the department that is doing the procedure, when your results will be ready. Summary  A coronary calcium scan is an imaging test used to look for deposits of plaque in the inner lining of the blood vessels of the heart (coronary arteries). Plaque is made up of calcium, protein, and fatty substances.  Generally, this is a safe procedure. Tell your health care provider if you are pregnant or may be pregnant.  Ask your health care provider for any specific instructions on how to prepare for this procedure.  A CT scanner will take pictures of your heart.  You can return to your normal activities after the scan is done. This information is not intended to replace advice given to you by your health care provider. Make sure you discuss any questions you have with your health care provider. Document Revised: 02/13/2019 Document Reviewed: 02/13/2019 Elsevier Patient Education  [place].

## 2019-08-24 ENCOUNTER — Ambulatory Visit: Payer: Medicare Other | Admitting: Podiatry

## 2019-08-30 ENCOUNTER — Encounter: Payer: Self-pay | Admitting: Internal Medicine

## 2019-09-01 ENCOUNTER — Ambulatory Visit: Payer: Medicare Other | Attending: Internal Medicine

## 2019-09-01 DIAGNOSIS — Z23 Encounter for immunization: Secondary | ICD-10-CM | POA: Insufficient documentation

## 2019-09-01 NOTE — Progress Notes (Signed)
   Covid-19 Vaccination Clinic  Name:  Antonio Ayers    MRN: [id number] DOB: 1951-03-21  09/01/2019  [name] was observed post Covid-19 immunization for 15 minutes without incidence. He was provided with Vaccine Information Sheet and instruction to access the V-Safe system.   [name] was instructed to call 911 with any severe reactions post vaccine: Marland Kitchen Difficulty breathing  . Swelling of your face and throat  . A fast heartbeat  . A bad rash all over your body  . Dizziness and weakness    Immunizations Administered    Name Date Dose VIS Date Route   Pfizer COVID-19 Vaccine 09/01/2019  2:30 PM 0.3 mL 07/20/2019 Intramuscular   Manufacturer: [place]   Lot: [id number]   [place]: [id number]

## 2019-09-07 ENCOUNTER — Ambulatory Visit: Payer: Medicare Other | Admitting: Podiatry

## 2019-09-18 ENCOUNTER — Other Ambulatory Visit: Payer: Medicare Other

## 2019-09-22 ENCOUNTER — Ambulatory Visit: Payer: Medicare Other | Attending: Internal Medicine

## 2019-09-22 DIAGNOSIS — Z23 Encounter for immunization: Secondary | ICD-10-CM | POA: Insufficient documentation

## 2019-09-22 NOTE — Progress Notes (Signed)
   Covid-19 Vaccination Clinic  Name:  Dot Hendel    MRN: [id number] DOB: [birth year]  09/22/2019  Mr. Viggiano was observed post Covid-19 immunization for 15 minutes without incidence. He was provided with Vaccine Information Sheet and instruction to access the V-Safe system.   Mr. Weatherby was instructed to call 911 with any severe reactions post vaccine: Marland Kitchen Difficulty breathing  . Swelling of your face and throat  . A fast heartbeat  . A bad rash all over your body  . Dizziness and weakness    Immunizations Administered    Name Date Dose VIS Date Route   Pfizer COVID-19 Vaccine 09/22/2019  1:18 PM 0.3 mL 07/20/2019 Intramuscular   Manufacturer: [place]   Lot: [id number]   [place]: [id number]

## 2019-09-24 ENCOUNTER — Ambulatory Visit: Payer: Medicare Other | Admitting: Podiatry

## 2019-10-02 ENCOUNTER — Encounter: Payer: Self-pay | Admitting: Sports Medicine

## 2019-10-02 ENCOUNTER — Ambulatory Visit: Payer: Medicare Other | Admitting: Sports Medicine

## 2019-10-02 ENCOUNTER — Other Ambulatory Visit: Payer: Self-pay

## 2019-10-02 VITALS — BP 119/83 | HR 84 | Temp 97.2°F | Resp 16

## 2019-10-02 DIAGNOSIS — B351 Tinea unguium: Secondary | ICD-10-CM

## 2019-10-02 DIAGNOSIS — M79675 Pain in left toe(s): Secondary | ICD-10-CM | POA: Diagnosis not present

## 2019-10-02 DIAGNOSIS — M79674 Pain in right toe(s): Secondary | ICD-10-CM | POA: Diagnosis not present

## 2019-10-02 NOTE — Progress Notes (Signed)
Subjective: Antonio Ayers is a 69 y.o. male patient seen today in office with complaint of mildly painful thickened and elongated toenails; unable to trim. Patient denies history of Diabetes, Neuropathy, or Vascular disease. Admits a history of AS on [place] Patient has no other pedal complaints at this time.   Patient Active Problem List   Diagnosis Date Noted  . Aortic aneurysm ([place])   . Aortic valve regurgitation 01/09/2019  . Nephrolithiasis 01/09/2019  . BPH (benign prostatic hyperplasia) 01/09/2019  . Overweight (BMI 25.0-29.9) 01/09/2019  . Primary osteoarthritis of both hips 12/29/2018  . Primary osteoarthritis of both knees 12/29/2018  . Raised intraocular pressure of both eyes 12/19/2018  . Ankylosing spondylitis of multiple sites in spine ([place]) 12/19/2018  . Hiatal hernia with GERD 11/08/2018  . History of colonoscopy 01/07/2018  . Gall stone in bile duct with infection of gallbladder 12/14/2017  . Hernia of abdominal cavity 12/07/2016  . Dislocated elbow, left, initial encounter 10/07/2016  . Enlarged liver 12/08/2006  . Aortic anomaly 01/07/2005  . Fracture 02/17/2004  . 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 Adalimumab (HUMIRA) 40 MG/0.4ML PSKT Inject 40 mg into the skin every 14 (fourteen) days. 6 each 0  . Cholecalciferol (D3-1000) 25 MCG (1000 UT) capsule Take 1,000 Units by mouth daily.     . COMBIGAN 0.2-0.5 % ophthalmic solution INT 1 GTT IN OU BID    . indapamide (LOZOL) 1.25 MG tablet TK 1 T PO QD    . latanoprost (XALATAN) 0.005 % ophthalmic solution INT 1 GTT IN OS QPM B DINNER    . LOTEMAX 0.5 % GEL INT 1 GTT IN OS D    . Polyethylene Glycol 3350 (MIRALAX PO) Take by mouth as needed.    . potassium citrate (UROCIT-K) 10 MEQ (1080 MG) SR tablet 3 (three) times daily.     No current facility-administered medications on file prior to visit.    No Known  Allergies  Objective: Physical Exam  General: Well developed, nourished, no acute distress, awake, alert and oriented x 3  Vascular: Dorsalis pedis artery 2/4 bilateral, Posterior tibial artery 1/4 bilateral, skin temperature warm to warm proximal to distal bilateral lower extremities, no varicosities, pedal hair present bilateral.  Neurological: Gross sensation present via light touch bilateral.   Dermatological: Skin is warm, dry, and supple bilateral, Nails 1-10 are tender, long, thick, and discolored with mild subungal debris, no webspace macerations present bilateral, no open lesions present bilateral, no callus/corns/hyperkeratotic tissue present bilateral. No signs of infection bilateral.  Musculoskeletal:  Asymptomatic hammertoe boney deformities noted bilateral. Muscular strength within normal limits without painon range of motion. No pain with calf compression bilateral.  Assessment and Plan:  Problem List Items Addressed This Visit    None    Visit Diagnoses    Pain due to onychomycosis of toenails of both feet    -  Primary     -Examined patient.  -Discussed treatment options for painful mycotic nails. -Mechanically debrided and reduced mycotic nails with sterile nail nipper and dremel nail file without incident. -Patient to return in 3 months for follow up evaluation or sooner if symptoms worsen.  Landis Martins, DPM

## 2019-10-03 DIAGNOSIS — Z961 Presence of intraocular lens: Secondary | ICD-10-CM | POA: Diagnosis not present

## 2019-10-03 DIAGNOSIS — H401123 Primary open-angle glaucoma, left eye, severe stage: Secondary | ICD-10-CM | POA: Diagnosis not present

## 2019-10-03 DIAGNOSIS — H2511 Age-related nuclear cataract, right eye: Secondary | ICD-10-CM | POA: Diagnosis not present

## 2019-10-03 DIAGNOSIS — Z947 Corneal transplant status: Secondary | ICD-10-CM | POA: Diagnosis not present

## 2019-10-03 DIAGNOSIS — H401111 Primary open-angle glaucoma, right eye, mild stage: Secondary | ICD-10-CM | POA: Diagnosis not present

## 2019-10-15 ENCOUNTER — Other Ambulatory Visit: Payer: Self-pay | Admitting: *Deleted

## 2019-10-15 DIAGNOSIS — M450 Ankylosing spondylitis of multiple sites in spine: Secondary | ICD-10-CM

## 2019-10-15 MED ORDER — HUMIRA (2 SYRINGE) 40 MG/0.4ML ~~LOC~~ PSKT: 40.0000 mg | PREFILLED_SYRINGE | SUBCUTANEOUS | 0 refills | Status: DC

## 2019-10-15 NOTE — Telephone Encounter (Signed)
Last Visit: 06/04/19 Next Visit: 11/02/19 Labs: 06/22/19 glucose 110, total bilirubin 3.5, TB Gold: 12/19/18 Neg   Okay to refill per Dr. Estanislado Pandy

## 2019-10-16 ENCOUNTER — Telehealth: Payer: Self-pay | Admitting: Internal Medicine

## 2019-10-16 NOTE — Telephone Encounter (Signed)
Error

## 2019-10-17 DIAGNOSIS — N200 Calculus of kidney: Secondary | ICD-10-CM | POA: Diagnosis not present

## 2019-10-17 DIAGNOSIS — R82994 Hypercalciuria: Secondary | ICD-10-CM | POA: Diagnosis not present

## 2019-10-18 ENCOUNTER — Other Ambulatory Visit: Payer: Self-pay | Admitting: Pharmacist

## 2019-10-18 DIAGNOSIS — M450 Ankylosing spondylitis of multiple sites in spine: Secondary | ICD-10-CM

## 2019-10-18 MED ORDER — HUMIRA (2 PEN) 40 MG/0.4ML ~~LOC~~ AJKT: 40.0000 mg | AUTO-INJECTOR | SUBCUTANEOUS | 0 refills | Status: DC

## 2019-10-18 NOTE — Progress Notes (Signed)
Received fax from my [employer] assist asking for clarification of dosage form.  Prescription was electronically sent for Humira syringes and patient was previously on pens.  Reviewed chart and Humira has historically been listed as syringes in epic.  Patient receives through patient assistance and prior refills were sent via refill fax request.  Reviewed prior fax request in media and patient has been receiving pens.  Faxed [employer] advising the correct dosage form is Humira pen device and to please change prescription.  Updated medication list to show that patient is on Humira pen device.   Mariella Saa, PharmD, [place], [place] Clinical Specialty Pharmacist [phone]  10/18/2019 11:29 AM

## 2019-10-22 ENCOUNTER — Encounter: Payer: Self-pay | Admitting: Internal Medicine

## 2019-10-22 DIAGNOSIS — N200 Calculus of kidney: Secondary | ICD-10-CM | POA: Diagnosis not present

## 2019-10-23 ENCOUNTER — Other Ambulatory Visit: Payer: Self-pay | Admitting: Family

## 2019-10-23 DIAGNOSIS — H40053 Ocular hypertension, bilateral: Secondary | ICD-10-CM

## 2019-10-25 DIAGNOSIS — H401123 Primary open-angle glaucoma, left eye, severe stage: Secondary | ICD-10-CM | POA: Diagnosis not present

## 2019-10-25 NOTE — Progress Notes (Signed)
Office Visit Note  Patient: Antonio Ayers             Date of Birth: [birth year]           MRN: [id number]             PCP: [name], [name], MD Referring: [name], [place]* Visit Date: 11/02/2019 Occupation: @GUAROCC @  Subjective:  Left SI joint pain   History of Present Illness: Zayvier Soliven is a 69 y.o. male with history of ankylosing spondylitis and osteoarthritis.  Patient is on Humira 40 mg subcutaneous injections once every 14 days.  He has not missed any doses.  He has not had any recent infections.  He reports that his symptoms have been stable on Humira.  He denies any recent flares.  He states he has occasional discomfort in the left hip and left SI joint.  He states his morning stiffness has been lasting about 10 minutes which is unchanged.  He denies any Achilles tendinitis or plantar fasciitis.  He denies any joint swelling at this time.  He denies any signs of psoriasis.  He continues to follow-up with his ophthalmologist closely and may be planning cataract surgery for the left eye.  He uses eyedrops on a daily basis.   Activities of Daily Living:  Patient reports morning stiffness for 10 minutes.   Patient Reports nocturnal pain.  Difficulty dressing/grooming: Reports Difficulty climbing stairs: Reports Difficulty getting out of chair: Reports Difficulty using hands for taps, buttons, cutlery, and/or writing: Denies  Review of Systems  Constitutional: Positive for fatigue. Negative for night sweats.  HENT: Positive for mouth dryness. Negative for mouth sores and nose dryness.   Eyes: Positive for dryness. Negative for redness.  Respiratory: Negative for shortness of breath and difficulty breathing.   Cardiovascular: Negative for chest pain, palpitations, hypertension, irregular heartbeat and swelling in legs/feet.  Gastrointestinal: Positive for constipation and diarrhea.  Genitourinary: Negative for difficulty urinating and painful  urination.  Musculoskeletal: Positive for arthralgias, gait problem, joint pain and morning stiffness. Negative for joint swelling, myalgias, muscle weakness, muscle tenderness and myalgias.  Skin: Positive for rash. Negative for color change, hair loss, nodules/bumps, skin tightness, ulcers and sensitivity to sunlight.  Allergic/Immunologic: Negative for susceptible to infections.  Neurological: Positive for numbness. Negative for dizziness, fainting, memory loss, night sweats and weakness.  Hematological: Negative for bruising/bleeding tendency and swollen glands.  Psychiatric/Behavioral: Positive for sleep disturbance. Negative for depressed mood. The patient is not nervous/anxious.     PMFS History:  Patient Active Problem List   Diagnosis Date Noted  . Aortic aneurysm ([place])   . Aortic valve regurgitation 01/09/2019  . Nephrolithiasis 01/09/2019  . BPH (benign prostatic hyperplasia) 01/09/2019  . Overweight (BMI 25.0-29.9) 01/09/2019  . Primary osteoarthritis of both hips 12/29/2018  . Primary osteoarthritis of both knees 12/29/2018  . Raised intraocular pressure of both eyes 12/19/2018  . Ankylosing spondylitis of multiple sites in spine ([place]) 12/19/2018  . Hiatal hernia with GERD 11/08/2018  . History of colonoscopy 01/07/2018  . Gall stone in bile duct with infection of gallbladder 12/14/2017  . Hernia of abdominal cavity 12/07/2016  . Dislocated elbow, left, initial encounter 10/07/2016  . Enlarged liver 12/08/2006  . Aortic anomaly 01/07/2005  . Fracture 02/17/2004  . 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 Ankylosing spondylitis ([place])   . Aortic aneurysm ([facility])    mild to moderate aortic aneurysm at the sinus ov Valsalva at 51mm.  This scan was done 04/28/2018.  Marland Kitchen Aortic insufficiency   . Nephrolithiasis     Family History  Problem Relation Age of Onset  . Stroke Mother   . Stroke Father   . Heart failure Father   . Diabetes Sister   .  Obesity Sister   . Hiatal hernia Sister    Past Surgical History:  Procedure Laterality Date  . CATARACT EXTRACTION Left 2016  . CHOLECYSTECTOMY  01/2018  . [place], 2014  . HERNIA REPAIR  2018  . HIATAL HERNIA REPAIR    . KIDNEY STONE SURGERY  03/2018   Social History   Social History Narrative  . Not on file   Immunization History  Administered Date(s) Administered  . Fluad Quad(high Dose 65+) 04/30/2019  . PFIZER SARS-COV-2 Vaccination 09/01/2019, 09/22/2019  . Pneumococcal Conjugate-13 02/22/2019  . Tdap 06/25/2019  . Zoster Recombinat (Shingrix) 02/22/2019, 04/30/2019     Objective: Vital Signs: BP 94/63 (BP Location: Left Arm, Patient Position: Sitting, Cuff Size: Normal)   Pulse (!) 101   Resp 16   Ht 6\' 1"  (1.854 m)   Wt 224 lb 3.2 oz (101.7 kg)   BMI 29.58 kg/m    Physical Exam Vitals and nursing note reviewed.  Constitutional:      Appearance: He is well-developed.  HENT:     Head: Normocephalic and atraumatic.  Eyes:     Conjunctiva/sclera: Conjunctivae normal.     Pupils: Pupils are equal, round, and reactive to light.     Comments: Subconjunctival hemorrhage left eye   Pulmonary:     Effort: Pulmonary effort is normal.  Abdominal:     General: Bowel sounds are normal.     Palpations: Abdomen is soft.  Musculoskeletal:     Cervical back: Normal range of motion and neck supple.  Skin:    General: Skin is warm and dry.     Capillary Refill: Capillary refill takes less than 2 seconds.  Neurological:     Mental Status: He is alert and oriented to person, place, and time.  Psychiatric:        Behavior: Behavior normal.      Musculoskeletal Exam: C-spine very limited range of motion with crepitus.  Thoracic kyphosis noted.  Limited range of motion of thoracic and lumbar spine.  No midline spinal tenderness.  No SI joint tenderness.  Shoulder joint abduction to about 120 degrees bilaterally.  Elbow joints, wrist joints, MCPs,  PIPs, DIPs good range of motion with no synovitis.  He has complete fist formation bilaterally.  Extremely limited range of motion of both hip joints noted.  He has limited extension of both knee joints, worse in the right knee.  No warmth or effusion of knee joints noted.  Ankle joints have good range of motion with no tenderness or inflammation.  No Achilles tendinitis or plantar fasciitis.  CDAI Exam: CDAI Score: -- Patient Global: --; Provider Global: -- Swollen: --; Tender: -- Joint Exam 11/02/2019   No joint exam has been documented for this visit   There is currently no information documented on the homunculus. Go to the Rheumatology activity and complete the homunculus joint exam.  Investigation: No additional findings.  Imaging: No results found.  Recent Labs: Lab Results  Component Value Date   WBC 7.9 06/22/2019   HGB 16.3 06/22/2019   PLT 193.0 06/22/2019   NA 139 06/22/2019   K 4.2 06/22/2019   CL 101 06/22/2019   CO2 29 06/22/2019  GLUCOSE 110 (H) 06/22/2019   BUN 13 06/22/2019   CREATININE 0.69 06/22/2019   BILITOT 3.5 (H) 06/22/2019   ALKPHOS 68 06/22/2019   AST 17 06/22/2019   ALT 19 06/22/2019   PROT 6.9 06/22/2019   ALBUMIN 4.5 06/22/2019   CALCIUM 9.2 06/22/2019   GFRAA 114 04/27/2019   QFTBGOLDPLUS NEGATIVE 12/19/2018    Speciality Comments: No specialty comments available.  Procedures:  No procedures performed Allergies: Patient has no known allergies.   Assessment / Plan:     Visit Diagnoses: Ankylosing spondylitis of multiple sites in spine [facility]) - Diagnosed in [place].  Last seen by rheumatologist June 07, 2018: He has not had any signs or symptoms of a flare recently.  His symptoms have been stable on Humira 40 mg sq injections every 14 days.  He has extremely limited range of motion of the C-spine, thoracic, and lumbar spine.  He has no midline spinal tenderness or SI joint tenderness at this time.  He has very limited range of motion of  both hip joints.  He experiences occasional discomfort in the left hip and left SI joint which typically improves with physical activity.  He has no Achilles tendinitis or plantar fasciitis.  He has not had any psoriasis or eye inflammation recently.  He will continue on Humira 40 mg subcutaneous injections once every 14 days.  He was advised to notify us if he develops increased joint pain, morning stiffness, or nocturnal pain.  He will follow-up in the office in 5 months.  High risk medication use - Humira 40 mg sq injections every 14 days.  CBC and CMP were drawn on 06/22/2019.  He will be due to update lab work in June and every 3 months to monitor for drug toxicity.  TB gold was negative on 12/19/2018.  A future order for TB gold was placed today.  He has not had any recent infections.  He has received both COVID-19 vaccinations.- Plan: COMPLETE METABOLIC PANEL WITH GFR, CBC with Differential/Platelet, QuantiFERON-TB Gold Plus  Abnormal SPEP - He was evaluated by hematology.    Primary osteoarthritis of both hips - Bilateral severe osteoarthritis: He has extremely limited range of motion of both hip joints.  He experiences occasional discomfort in the left hip.  His discomfort typically improves with physical activity.  Primary osteoarthritis of both knees - Bilateral moderate osteoarthritis and severe chondromalacia patella: He has limited extension of both knee joints, especially of the right knee.  No warmth or effusion was noted.  Raised intraocular pressure of both eyes: He follows up closely with his ophthalmologist.  He has an upcoming appointment on Monday.  Other fatigue: Chronic but stable  Orders: Orders Placed This Encounter  Procedures  . COMPLETE METABOLIC PANEL WITH GFR  . CBC with Differential/Platelet  . QuantiFERON-TB Gold Plus   No orders of the defined types were placed in this encounter.    Follow-Up Instructions: Return in about 5 months (around 04/03/2020) for  Ankylosing Spondylitis.   Hazel Sams, PA-C  I examined and evaluated the patient with Hazel Sams PA.  Patient is clinically doing well on Humira.  He had no synovitis on my examination today.  The plan of care was discussed as noted above.  Bo Merino, MD  Note - This record has been created using Editor, commissioning.  Chart creation errors have been sought, but may not always  have been located. Such creation errors do not reflect on  the standard of medical care.

## 2019-11-02 ENCOUNTER — Encounter: Payer: Self-pay | Admitting: Rheumatology

## 2019-11-02 ENCOUNTER — Other Ambulatory Visit: Payer: Self-pay

## 2019-11-02 ENCOUNTER — Ambulatory Visit: Payer: Medicare Other | Admitting: Rheumatology

## 2019-11-02 VITALS — BP 94/63 | HR 101 | Resp 16 | Ht 73.0 in | Wt 224.2 lb

## 2019-11-02 DIAGNOSIS — M160 Bilateral primary osteoarthritis of hip: Secondary | ICD-10-CM

## 2019-11-02 DIAGNOSIS — R5383 Other fatigue: Secondary | ICD-10-CM

## 2019-11-02 DIAGNOSIS — R778 Other specified abnormalities of plasma proteins: Secondary | ICD-10-CM

## 2019-11-02 DIAGNOSIS — Z79899 Other long term (current) drug therapy: Secondary | ICD-10-CM

## 2019-11-02 DIAGNOSIS — H40053 Ocular hypertension, bilateral: Secondary | ICD-10-CM

## 2019-11-02 DIAGNOSIS — M450 Ankylosing spondylitis of multiple sites in spine: Secondary | ICD-10-CM

## 2019-11-02 DIAGNOSIS — M170 Bilateral primary osteoarthritis of knee: Secondary | ICD-10-CM

## 2019-11-03 LAB — CBC WITH DIFFERENTIAL/PLATELET
Absolute Monocytes: 835 cells/uL (ref 200–950)
Basophils Absolute: 21 cells/uL (ref 0–200)
Basophils Relative: 0.3 %
Eosinophils Absolute: 104 cells/uL (ref 15–500)
Eosinophils Relative: 1.5 %
HCT: 45.4 % (ref 38.5–50.0)
Hemoglobin: 16 g/dL (ref 13.2–17.1)
Lymphs Abs: 1277 cells/uL (ref 850–3900)
MCH: 32.6 pg (ref 27.0–33.0)
MCHC: 35.2 g/dL (ref 32.0–36.0)
MCV: 92.5 fL (ref 80.0–100.0)
MPV: 9.4 fL (ref 7.5–12.5)
Monocytes Relative: 12.1 %
Neutro Abs: 4664 cells/uL (ref 1500–7800)
Neutrophils Relative %: 67.6 %
Platelets: 191 10*3/uL (ref 140–400)
RBC: 4.91 10*6/uL (ref 4.20–5.80)
RDW: 13 % (ref 11.0–15.0)
Total Lymphocyte: 18.5 %
WBC: 6.9 10*3/uL (ref 3.8–10.8)

## 2019-11-03 LAB — COMPLETE METABOLIC PANEL WITH GFR
AG Ratio: 1.9 (calc) (ref 1.0–2.5)
ALT: 23 U/L (ref 9–46)
AST: 21 U/L (ref 10–35)
Albumin: 4.3 g/dL (ref 3.6–5.1)
Alkaline phosphatase (APISO): 65 U/L (ref 35–144)
BUN/Creatinine Ratio: 22 (calc) (ref 6–22)
BUN: 14 mg/dL (ref 7–25)
CO2: 28 mmol/L (ref 20–32)
Calcium: 9.5 mg/dL (ref 8.6–10.3)
Chloride: 102 mmol/L (ref 98–110)
Creat: 0.65 mg/dL — ABNORMAL LOW (ref 0.70–1.25)
GFR, Est African American: 115 mL/min/{1.73_m2} (ref >=60)
GFR, Est Non African American: 99 mL/min/{1.73_m2} (ref >=60)
Globulin: 2.3 g/dL (calc) (ref 1.9–3.7)
Glucose, Bld: 102 mg/dL — ABNORMAL HIGH (ref 65–99)
Potassium: 3.9 mmol/L (ref 3.5–5.3)
Sodium: 140 mmol/L (ref 135–146)
Total Bilirubin: 2.9 mg/dL — ABNORMAL HIGH (ref 0.2–1.2)
Total Protein: 6.6 g/dL (ref 6.1–8.1)

## 2019-11-05 DIAGNOSIS — H1789 Other corneal scars and opacities: Secondary | ICD-10-CM | POA: Diagnosis not present

## 2019-11-05 DIAGNOSIS — H401123 Primary open-angle glaucoma, left eye, severe stage: Secondary | ICD-10-CM | POA: Diagnosis not present

## 2019-11-05 DIAGNOSIS — H2511 Age-related nuclear cataract, right eye: Secondary | ICD-10-CM | POA: Diagnosis not present

## 2019-11-05 DIAGNOSIS — H524 Presbyopia: Secondary | ICD-10-CM | POA: Diagnosis not present

## 2019-11-05 NOTE — Progress Notes (Signed)
Total bilirubin remains elevated but is trending down.  Please forward labs to PCP.  Creatinine borderline low.  Rest of CMP WNL. CBC WNL.

## 2019-11-07 ENCOUNTER — Telehealth: Payer: Self-pay | Admitting: Rheumatology

## 2019-11-07 NOTE — Telephone Encounter (Signed)
Patient was returning your call in reference to lab results. Please return call when available.

## 2019-11-07 NOTE — Telephone Encounter (Signed)
Attempted to contact the patient left message for patient to call the office.

## 2019-11-13 DIAGNOSIS — D1801 Hemangioma of skin and subcutaneous tissue: Secondary | ICD-10-CM | POA: Diagnosis not present

## 2019-11-13 DIAGNOSIS — Z1283 Encounter for screening for malignant neoplasm of skin: Secondary | ICD-10-CM | POA: Diagnosis not present

## 2019-12-27 ENCOUNTER — Encounter: Payer: Self-pay | Admitting: Rheumatology

## 2019-12-27 ENCOUNTER — Encounter: Payer: Self-pay | Admitting: Internal Medicine

## 2019-12-28 ENCOUNTER — Telehealth: Payer: Self-pay

## 2019-12-28 DIAGNOSIS — M450 Ankylosing spondylitis of multiple sites in spine: Secondary | ICD-10-CM

## 2019-12-28 MED ORDER — HUMIRA (2 PEN) 40 MG/0.4ML ~~LOC~~ AJKT: 40.0000 mg | AUTO-INJECTOR | SUBCUTANEOUS | 0 refills | Status: DC

## 2019-12-28 NOTE — Telephone Encounter (Signed)
Refill request received via fax from [place] for Humira.   Last Visit: 11/02/2019 Next Visit: 04/03/2020 Labs: 11/02/2019 Total bilirubin remains elevated but is trending down. Creatinine borderline low. Rest of CMP WNL. CBC WNL.  TB Gold: 12/19/2018 negative   Sent MyChart message to patient to advise he is due to update TB gold.   Okay to refill per Dr. Estanislado Pandy.

## 2020-01-01 ENCOUNTER — Encounter: Payer: Self-pay | Admitting: Sports Medicine

## 2020-01-01 ENCOUNTER — Ambulatory Visit: Payer: Medicare Other | Admitting: Sports Medicine

## 2020-01-01 ENCOUNTER — Other Ambulatory Visit: Payer: Self-pay | Admitting: *Deleted

## 2020-01-01 ENCOUNTER — Other Ambulatory Visit: Payer: Self-pay

## 2020-01-01 VITALS — Temp 97.7°F

## 2020-01-01 DIAGNOSIS — B351 Tinea unguium: Secondary | ICD-10-CM | POA: Diagnosis not present

## 2020-01-01 DIAGNOSIS — M79675 Pain in left toe(s): Secondary | ICD-10-CM | POA: Diagnosis not present

## 2020-01-01 DIAGNOSIS — Z79899 Other long term (current) drug therapy: Secondary | ICD-10-CM

## 2020-01-01 DIAGNOSIS — M79674 Pain in right toe(s): Secondary | ICD-10-CM | POA: Diagnosis not present

## 2020-01-01 NOTE — Progress Notes (Signed)
Subjective: Antonio Ayers is a 69 y.o. male patient seen today in office with complaint of mildly painful thickened and elongated toenails; unable to trim. Has a history of AS on [place] Patient has no other pedal complaints at this time. No changes with medical history since last encounter.   Patient Active Problem List   Diagnosis Date Noted  . Aortic aneurysm ([place])   . Aortic valve regurgitation 01/09/2019  . Nephrolithiasis 01/09/2019  . BPH (benign prostatic hyperplasia) 01/09/2019  . Overweight (BMI 25.0-29.9) 01/09/2019  . Primary osteoarthritis of both hips 12/29/2018  . Primary osteoarthritis of both knees 12/29/2018  . Raised intraocular pressure of both eyes 12/19/2018  . Ankylosing spondylitis of multiple sites in spine ([place]) 12/19/2018  . Hiatal hernia with GERD 11/08/2018  . History of colonoscopy 01/07/2018  . Gall stone in bile duct with infection of gallbladder 12/14/2017  . Hernia of abdominal cavity 12/07/2016  . Dislocated elbow, left, initial encounter 10/07/2016  . Enlarged liver 12/08/2006  . Aortic anomaly 01/07/2005  . Fracture 02/17/2004  . 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 Adalimumab (HUMIRA PEN) 40 MG/0.4ML PNKT Inject 40 mg into the skin every 14 (fourteen) days. 3 each 0  . Cholecalciferol (D3-1000) 25 MCG (1000 UT) capsule Take 1,000 Units by mouth daily.     . COMBIGAN 0.2-0.5 % ophthalmic solution INT 1 GTT IN OU BID    . indapamide (LOZOL) 1.25 MG tablet TK 1 T PO QD    . latanoprost (XALATAN) 0.005 % ophthalmic solution INT 1 GTT IN OS QPM B DINNER    . LOTEMAX 0.5 % GEL INT 1 GTT IN OS D    . Polyethylene Glycol 3350 (MIRALAX PO) Take by mouth as needed.    . potassium citrate (UROCIT-K) 10 MEQ (1080 MG) SR tablet 3 (three) times daily.     No current facility-administered medications on file prior to visit.    No Known Allergies  Objective: Physical  Exam  General: Well developed, nourished, no acute distress, awake, alert and oriented x 3  Vascular: Dorsalis pedis artery 2/4 bilateral, Posterior tibial artery 1/4 bilateral, skin temperature warm to warm proximal to distal bilateral lower extremities, no varicosities, pedal hair present bilateral.  Neurological: Gross sensation present via light touch bilateral.   Dermatological: Skin is warm, dry, and supple bilateral, Nails 1-10 are tender, long, thick, and discolored with mild subungal debris, no webspace macerations present bilateral, no open lesions present bilateral, no callus/corns/hyperkeratotic tissue present bilateral. No signs of infection bilateral.  Musculoskeletal:  Asymptomatic hammertoe boney deformities noted bilateral. Muscular strength within normal limits without painon range of motion. No pain with calf compression bilateral.  Assessment and Plan:  Problem List Items Addressed This Visit    None    Visit Diagnoses    Pain due to onychomycosis of toenails of both feet    -  Primary     -Examined patient.  -Re-Discussed treatment options for painful mycotic nails. -Mechanically debrided and reduced mycotic nails with sterile nail nipper and dremel nail file without incident. -Patient to return in 3 months for follow up evaluation or sooner if symptoms worsen.  Landis Martins, DPM

## 2020-01-02 ENCOUNTER — Telehealth: Payer: Self-pay | Admitting: Rheumatology

## 2020-01-02 NOTE — Telephone Encounter (Signed)
Patient left a voicemail checking the status of his prescription refill of Humira.  Patient requested a return call.

## 2020-01-02 NOTE — Telephone Encounter (Signed)
Patient advised prescription was sent to the pharmacy on 12/28/2019. Patient advised to contact my [employer] to set up shipment and to call the office if he has any trouble.

## 2020-01-03 LAB — QUANTIFERON-TB GOLD PLUS
Mitogen-NIL: >10 IU/mL
NIL: 0.03 IU/mL
QuantiFERON-TB Gold Plus: NEGATIVE
TB1-NIL: 0 IU/mL
TB2-NIL: 0 IU/mL

## 2020-01-03 NOTE — Progress Notes (Signed)
TB gold negative

## 2020-01-10 ENCOUNTER — Encounter: Payer: Self-pay | Admitting: Rheumatology

## 2020-01-10 ENCOUNTER — Encounter: Payer: Self-pay | Admitting: Internal Medicine

## 2020-01-10 NOTE — Telephone Encounter (Signed)
ACR (American college of rheumatology) released guidelines for the timing considerations for immunomodulatory therapy and vaccination in April 2021 after his visit in January.    As of now they do not recommend holding Humira, but if his disease is well controlled and he hasn't had any recent flares he can hold humira around the time of receiving the booster vaccine.  Guidelines have not been released in regards to this topic yet.

## 2020-01-11 ENCOUNTER — Telehealth (INDEPENDENT_AMBULATORY_CARE_PROVIDER_SITE_OTHER): Payer: Medicare Other | Admitting: Internal Medicine

## 2020-01-11 DIAGNOSIS — Z7189 Other specified counseling: Secondary | ICD-10-CM

## 2020-01-11 NOTE — Progress Notes (Signed)
Virtual Visit via Video Note  I connected with Antonio Ayers on 01/11/20 at  3:45 PM EDT by a video enabled telemedicine application and verified that I am speaking with the correct person using two identifiers.  Location patient: home Location provider: work office Persons participating in the virtual visit: patient, provider  I discussed the limitations of evaluation and management by telemedicine and the availability of in person appointments. The patient expressed understanding and agreed to proceed.   HPI:  Antonio Ayers is a 69 y.o. male who is coming in today for the above mentioned reasons.  This visit was mainly scheduled because of his many concerns regarding the Covid vaccine.  He is on Humira.  He has heard that patients on Humira who have somewhat of a suppressed immune system might have a lesser response to the Covid vaccine.  He has many questions about this.  He wonders whether he should be revaccinated, he wonders whether he should have Covid antibodies tested.  He has reached out to his rheumatologist as well.    ROS: Constitutional: Denies fever, chills, diaphoresis, appetite change and fatigue.  HEENT: Denies photophobia, eye pain, redness, hearing loss, ear pain, congestion, sore throat, rhinorrhea, sneezing, mouth sores, trouble swallowing, neck pain, neck stiffness and tinnitus.   Respiratory: Denies SOB, DOE, cough, chest tightness,  and wheezing.   Cardiovascular: Denies chest pain, palpitations and leg swelling.  Gastrointestinal: Denies nausea, vomiting, abdominal pain, diarrhea, constipation, blood in stool and abdominal distention.  Genitourinary: Denies dysuria, urgency, frequency, hematuria, flank pain and difficulty urinating.  Endocrine: Denies: hot or cold intolerance, sweats, changes in hair or nails, polyuria, polydipsia. Musculoskeletal: Denies myalgias, back pain, joint swelling, arthralgias and gait problem.  Skin: Denies pallor, rash and  wound.  Neurological: Denies dizziness, seizures, syncope, weakness, light-headedness, numbness and headaches.  Hematological: Denies adenopathy. Easy bruising, personal or family bleeding history  Psychiatric/Behavioral: Denies suicidal ideation, mood changes, confusion, nervousness, sleep disturbance and agitation   Past Medical History:  Diagnosis Date  . Ankylosing spondylitis ([place])   . Aortic aneurysm ([facility])    mild to moderate aortic aneurysm at the sinus ov Valsalva at 75mm.  This scan was done 04/28/2018.  Marland Kitchen Aortic insufficiency   . Nephrolithiasis     Past Surgical History:  Procedure Laterality Date  . CATARACT EXTRACTION Left 2016  . CHOLECYSTECTOMY  01/2018  . [place], 2014  . HERNIA REPAIR  2018  . HIATAL HERNIA REPAIR    . KIDNEY STONE SURGERY  03/2018    Family History  Problem Relation Age of Onset  . Stroke Mother   . Stroke Father   . Heart failure Father   . Diabetes Sister   . Obesity Sister   . Hiatal hernia Sister     SOCIAL HX:   reports that he has never smoked. He has never used smokeless tobacco. He reports current alcohol use. He reports previous drug use.   Current Outpatient Medications:  .  Adalimumab (HUMIRA PEN) 40 MG/0.4ML PNKT, Inject 40 mg into the skin every 14 (fourteen) days., Disp: 3 each, Rfl: 0 .  Cholecalciferol (D3-1000) 25 MCG (1000 UT) capsule, Take 1,000 Units by mouth daily. , Disp: , Rfl:  .  COMBIGAN 0.2-0.5 % ophthalmic solution, INT 1 GTT IN OU BID, Disp: , Rfl:  .  indapamide (LOZOL) 1.25 MG tablet, TK 1 T PO QD, Disp: , Rfl:  .  latanoprost (XALATAN) 0.005 % ophthalmic solution, INT 1  GTT IN OS QPM B DINNER, Disp: , Rfl:  .  LOTEMAX 0.5 % GEL, INT 1 GTT IN OS D, Disp: , Rfl:  .  Polyethylene Glycol 3350 (MIRALAX PO), Take by mouth as needed., Disp: , Rfl:  .  potassium citrate (UROCIT-K) 10 MEQ (1080 MG) SR tablet, 3 (three) times daily., Disp: , Rfl:   EXAM:   VITALS per patient if  applicable: None reported  GENERAL: alert, oriented, appears well and in no acute distress  HEENT: atraumatic, conjunttiva clear, no obvious abnormalities on inspection of external nose and ears  NECK: normal movements of the head and neck  LUNGS: on inspection no signs of respiratory distress, breathing rate appears normal, no obvious gross increased work of breathing, gasping or wheezing  CV: no obvious cyanosis  MS: moves all visible extremities without noticeable abnormality  PSYCH/NEURO: pleasant and cooperative, no obvious depression or anxiety, speech and thought processing grossly intact  ASSESSMENT AND PLAN:   Counseling and coordination of care  -Have discussed with him that anecdotally we have noticed a lesser response to Covid vaccine in folks who have a suppressed immune system such as patients like himself on immunosuppressive medications, however we have no guidelines of efficacy regarding revaccination or testing of Covid antibodies. -He received a similar response from his rheumatologist. -We have discussed that new data is constantly emerging and that we will update him if we hear anything different. -In the meantime I have advised that he continue with his social distancing and safe mask practices.   I discussed the assessment and treatment plan with the patient. The patient was provided an opportunity to ask questions and all were answered. The patient agreed with the plan and demonstrated an understanding of the instructions.   The patient was advised to call back or seek an in-person evaluation if the symptoms worsen or if the condition fails to improve as anticipated.    Lelon Frohlich, MD  [facility]

## 2020-01-18 ENCOUNTER — Encounter: Payer: Self-pay | Admitting: Internal Medicine

## 2020-02-04 ENCOUNTER — Telehealth: Payer: Self-pay | Admitting: Internal Medicine

## 2020-02-04 NOTE — Progress Notes (Signed)
  Chronic Care Management   Outreach Note  02/04/2020 Name: Antonio Ayers MRN: [id number] DOB: 08-02-1951  Referred by: [name], [name], MD Reason for referral : No chief complaint on file.   An unsuccessful telephone outreach was attempted today. The patient was referred to the pharmacist for assistance with care management and care coordination.   Follow Up Plan:   [name]

## 2020-02-05 ENCOUNTER — Telehealth: Payer: Self-pay | Admitting: Internal Medicine

## 2020-02-05 NOTE — Progress Notes (Signed)
  Chronic Care Management   Note  02/05/2020 Name: Harless Molinari MRN: [id number] DOB: 03-01-1951  Aaron Bostwick is a 69 y.o. year old male who is a primary care patient of [name], [name], MD. I reached out to [name] by phone today in response to a referral sent by Mr. Hervey Wedig PCP, [name], [name], MD.   Mr. Dirosa was given information about Chronic Care Management services today including:  1. CCM service includes personalized support from designated clinical staff supervised by his physician, including individualized plan of care and coordination with other care providers 2. 24/7 contact phone numbers for assistance for urgent and routine care needs. 3. Service will only be billed when office clinical staff spend 20 minutes or more in a month to coordinate care. 4. Only one practitioner may furnish and bill the service in a calendar month. 5. The patient may stop CCM services at any time (effective at the end of the month) by phone call to the office staff.   Patient agreed to services and verbal consent obtained.   Follow up plan:   [place]

## 2020-02-15 ENCOUNTER — Encounter: Payer: Self-pay | Admitting: Internal Medicine

## 2020-02-15 DIAGNOSIS — Z1211 Encounter for screening for malignant neoplasm of colon: Secondary | ICD-10-CM

## 2020-02-18 NOTE — Telephone Encounter (Signed)
Patient called today wanting to talk to Antonio Ayers about his extreme constipation. I advised the patient that Antonio Ayers is not available today that she is off work and I suggested to transfer him to our nurse triage.  Waiting Nurse Triage notes.

## 2020-02-19 ENCOUNTER — Encounter: Payer: Self-pay | Admitting: Internal Medicine

## 2020-02-19 ENCOUNTER — Other Ambulatory Visit: Payer: Self-pay

## 2020-02-19 ENCOUNTER — Ambulatory Visit (INDEPENDENT_AMBULATORY_CARE_PROVIDER_SITE_OTHER): Payer: Medicare Other | Admitting: Internal Medicine

## 2020-02-19 VITALS — BP 110/80 | HR 86 | Temp 98.1°F | Wt 221.9 lb

## 2020-02-19 DIAGNOSIS — K5900 Constipation, unspecified: Secondary | ICD-10-CM | POA: Diagnosis not present

## 2020-02-19 NOTE — Telephone Encounter (Signed)
Patient here for an office visit 02/19/20

## 2020-02-19 NOTE — Patient Instructions (Signed)
-  Nice seeing you today!!  -Increase fluid consumption, add fiber supplement daily, use colace and miralax as discussed.   Constipation, Adult Constipation is when a person:  Poops (has a bowel movement) fewer times in a week than normal.  Has a hard time pooping.  Has poop that is dry, hard, or bigger than normal. Follow these instructions at home: Eating and drinking   Eat foods that have a lot of fiber, such as: ? Fresh fruits and vegetables. ? Whole grains. ? Beans.  Eat less of foods that are high in fat, low in fiber, or overly processed, such as: ? [place] fries. ? Hamburgers. ? Cookies. ? Candy. ? Soda.  Drink enough fluid to keep your pee (urine) clear or pale yellow. General instructions  Exercise regularly or as told by your doctor.  Go to the restroom when you feel like you need to poop. Do not hold it in.  Take over-the-counter and prescription medicines only as told by your doctor. These include any fiber supplements.  Do pelvic floor retraining exercises, such as: ? Doing deep breathing while relaxing your lower belly (abdomen). ? Relaxing your pelvic floor while pooping.  Watch your condition for any changes.  Keep all follow-up visits as told by your doctor. This is important. Contact a doctor if:  You have pain that gets worse.  You have a fever.  You have not pooped for 4 days.  You throw up (vomit).  You are not hungry.  You lose weight.  You are bleeding from the anus.  You have thin, pencil-like poop (stool). Get help right away if:  You have a fever, and your symptoms suddenly get worse.  You leak poop or have blood in your poop.  Your belly feels hard or bigger than normal (is bloated).  You have very bad belly pain.  You feel dizzy or you faint. This information is not intended to replace advice given to you by your health care provider. Make sure you discuss any questions you have with your health care  provider. Document Revised: 07/08/2017 Document Reviewed: 01/14/2016 Elsevier Patient Education  2020 [employer].

## 2020-02-19 NOTE — Progress Notes (Signed)
Established Patient Office Visit     This visit occurred during the SARS-CoV-2 public health emergency.  Safety protocols were in place, including screening questions prior to the visit, additional usage of staff PPE, and extensive cleaning of exam room while observing appropriate contact time as indicated for disinfecting solutions.    CC/Reason for Visit: Constipation  HPI: Antonio Ayers is a 69 y.o. male who is coming in today for the above mentioned reasons.  He is here to discuss what he perceives as constipation.  His last spontaneous bowel movement was on Thursday, which was 5 days ago, however on Saturday he did an enema which "worked wonders but I do not want to do it all the time".  This morning he feels like he passed "gas and water".  He does not have significant abdominal pain, nausea or vomiting.  He does not feel like he has to strain when he does have a bowel movement.   Past Medical/Surgical History: Past Medical History:  Diagnosis Date  . Ankylosing spondylitis ([place])   . Aortic aneurysm ([facility])    mild to moderate aortic aneurysm at the sinus ov Valsalva at 69mm.  This scan was done 04/28/2018.  Marland Kitchen Aortic insufficiency   . Nephrolithiasis     Past Surgical History:  Procedure Laterality Date  . CATARACT EXTRACTION Left 2016  . CHOLECYSTECTOMY  01/2018  . [place], 2014  . HERNIA REPAIR  2018  . HIATAL HERNIA REPAIR    . KIDNEY STONE SURGERY  03/2018    Social History:  reports that he has never smoked. He has never used smokeless tobacco. He reports current alcohol use. He reports previous drug use.  Allergies: No Known Allergies  Family History:  Family History  Problem Relation Age of Onset  . Stroke Mother   . Stroke Father   . Heart failure Father   . Diabetes Sister   . Obesity Sister   . Hiatal hernia Sister      Current Outpatient Medications:  .  Adalimumab (HUMIRA PEN) 40 MG/0.4ML PNKT, Inject 40 mg into the  skin every 14 (fourteen) days., Disp: 3 each, Rfl: 0 .  Cholecalciferol (D3-1000) 25 MCG (1000 UT) capsule, Take 1,000 Units by mouth daily. , Disp: , Rfl:  .  COMBIGAN 0.2-0.5 % ophthalmic solution, INT 1 GTT IN OU BID, Disp: , Rfl:  .  indapamide (LOZOL) 1.25 MG tablet, TK 1 T PO QD, Disp: , Rfl:  .  latanoprost (XALATAN) 0.005 % ophthalmic solution, INT 1 GTT IN OS QPM B DINNER, Disp: , Rfl:  .  LOTEMAX 0.5 % GEL, INT 1 GTT IN OS D, Disp: , Rfl:  .  Polyethylene Glycol 3350 (MIRALAX PO), Take by mouth as needed., Disp: , Rfl:  .  potassium citrate (UROCIT-K) 10 MEQ (1080 MG) SR tablet, 3 (three) times daily., Disp: , Rfl:   Review of Systems:  Constitutional: Denies fever, chills, diaphoresis, appetite change and fatigue.  HEENT: Denies photophobia, eye pain, redness, hearing loss, ear pain, congestion, sore throat, rhinorrhea, sneezing, mouth sores, trouble swallowing, neck pain, neck stiffness and tinnitus.   Respiratory: Denies SOB, DOE, cough, chest tightness,  and wheezing.   Cardiovascular: Denies chest pain, palpitations and leg swelling.  Gastrointestinal: Denies nausea, vomiting, abdominal pain, diarrhea, blood in stool and abdominal distention.  Genitourinary: Denies dysuria, urgency, frequency, hematuria, flank pain and difficulty urinating.  Endocrine: Denies: hot or cold intolerance, sweats, changes in hair or nails,  polyuria, polydipsia. Musculoskeletal: Denies myalgias, back pain, joint swelling, arthralgias and gait problem.  Skin: Denies pallor, rash and wound.  Neurological: Denies dizziness, seizures, syncope, weakness, light-headedness, numbness and headaches.  Hematological: Denies adenopathy. Easy bruising, personal or family bleeding history  Psychiatric/Behavioral: Denies suicidal ideation, mood changes, confusion, nervousness, sleep disturbance and agitation    Physical Exam: Vitals:   02/19/20 0732  BP: 110/80  Pulse: 86  Temp: 98.1 F (36.7 C)  TempSrc:  Temporal  SpO2: 95%  Weight: 221 lb 14.4 oz (100.7 kg)    Body mass index is 29.28 kg/m.   Constitutional: NAD, calm, comfortable Eyes: PERRL, lids and conjunctivae normal, wears corrective lenses ENMT: Mucous membranes are moist.  Respiratory: clear to auscultation bilaterally, no wheezing, no crackles. Normal respiratory effort. No accessory muscle use.  Cardiovascular: Regular rate and rhythm, no murmurs / rubs / gallops. No extremity edema.  Abdomen: no tenderness, no masses palpated. No hepatosplenomegaly. Bowel sounds positive.  Psychiatric: Normal judgment and insight. Alert and oriented x 3. Normal mood.    Impression and Plan:  Constipation, unspecified constipation type -We discussed increasing fluid intake, adding a daily fiber supplement. -We discussed guidelines of using MiraLAX and Colace and how he can play with those doses with a goal of 1 soft bowel movement at least every other day. -He will call us back if no improvement or any questions.   Patient Instructions  -Nice seeing you today!!  -Increase fluid consumption, add fiber supplement daily, use colace and miralax as discussed.   Constipation, Adult Constipation is when a person:  Poops (has a bowel movement) fewer times in a week than normal.  Has a hard time pooping.  Has poop that is dry, hard, or bigger than normal. Follow these instructions at home: Eating and drinking   Eat foods that have a lot of fiber, such as: ? Fresh fruits and vegetables. ? Whole grains. ? Beans.  Eat less of foods that are high in fat, low in fiber, or overly processed, such as: ? [place] fries. ? Hamburgers. ? Cookies. ? Candy. ? Soda.  Drink enough fluid to keep your pee (urine) clear or pale yellow. General instructions  Exercise regularly or as told by your doctor.  Go to the restroom when you feel like you need to poop. Do not hold it in.  Take over-the-counter and prescription medicines only as told  by your doctor. These include any fiber supplements.  Do pelvic floor retraining exercises, such as: ? Doing deep breathing while relaxing your lower belly (abdomen). ? Relaxing your pelvic floor while pooping.  Watch your condition for any changes.  Keep all follow-up visits as told by your doctor. This is important. Contact a doctor if:  You have pain that gets worse.  You have a fever.  You have not pooped for 4 days.  You throw up (vomit).  You are not hungry.  You lose weight.  You are bleeding from the anus.  You have thin, pencil-like poop (stool). Get help right away if:  You have a fever, and your symptoms suddenly get worse.  You leak poop or have blood in your poop.  Your belly feels hard or bigger than normal (is bloated).  You have very bad belly pain.  You feel dizzy or you faint. This information is not intended to replace advice given to you by your health care provider. Make sure you discuss any questions you have with your health care provider. Document Revised: 07/08/2017  Document Reviewed: 01/14/2016 Elsevier Patient Education  2020 [place], [place] [facility]

## 2020-02-29 ENCOUNTER — Encounter: Payer: Self-pay | Admitting: Internal Medicine

## 2020-03-10 DIAGNOSIS — H401123 Primary open-angle glaucoma, left eye, severe stage: Secondary | ICD-10-CM | POA: Diagnosis not present

## 2020-03-10 DIAGNOSIS — H1789 Other corneal scars and opacities: Secondary | ICD-10-CM | POA: Diagnosis not present

## 2020-03-17 ENCOUNTER — Ambulatory Visit (HOSPITAL_COMMUNITY): Payer: Medicare Other | Attending: Cardiology

## 2020-03-17 ENCOUNTER — Ambulatory Visit (INDEPENDENT_AMBULATORY_CARE_PROVIDER_SITE_OTHER)
Admission: RE | Admit: 2020-03-17 | Discharge: 2020-03-17 | Disposition: A | Discharge status: Home / Self Care | Payer: Self-pay | Source: Ambulatory Visit | Attending: Cardiology | Admitting: Cardiology

## 2020-03-17 ENCOUNTER — Encounter: Payer: Self-pay | Admitting: Cardiology

## 2020-03-17 ENCOUNTER — Other Ambulatory Visit: Payer: Self-pay

## 2020-03-17 DIAGNOSIS — I712 Thoracic aortic aneurysm, without rupture, unspecified: Secondary | ICD-10-CM

## 2020-03-17 DIAGNOSIS — I351 Nonrheumatic aortic (valve) insufficiency: Secondary | ICD-10-CM | POA: Insufficient documentation

## 2020-03-17 DIAGNOSIS — I2510 Atherosclerotic heart disease of native coronary artery without angina pectoris: Secondary | ICD-10-CM

## 2020-03-17 LAB — ECHOCARDIOGRAM COMPLETE
Area-P 1/2: 3.3 cm2
P 1/2 time: 764 msec
S' Lateral: 3.4 cm

## 2020-03-19 ENCOUNTER — Telehealth: Payer: Self-pay

## 2020-03-19 DIAGNOSIS — I712 Thoracic aortic aneurysm, without rupture, unspecified: Secondary | ICD-10-CM

## 2020-03-19 DIAGNOSIS — I2510 Atherosclerotic heart disease of native coronary artery without angina pectoris: Secondary | ICD-10-CM

## 2020-03-19 NOTE — Telephone Encounter (Signed)
Antonio Margarita, MD  [name], [name], MD; Antonieta Iba, RN Patient has evidence of coronary Ca. Please have him come in for FLP     The patient has been notified of the result and verbalized understanding.  All questions (if any) were answered. Antonieta Iba, RN 03/19/2020 12:34 PM

## 2020-03-19 NOTE — Telephone Encounter (Signed)
-----   Message from Sueanne Margarita, MD sent at 03/17/2020  2:39 PM EDT ----- Normal heart function with mildly thickened and stiff heart related to aging.  Trivial leakiness of AV and mildly dilated ascending aorta at 67mm.  Repeat echo in  1 year limited for dilated aorta

## 2020-03-20 NOTE — Progress Notes (Deleted)
Office Visit Note  Patient: Antonio Ayers             Date of Birth: 13-Feb-1951           MRN: [id number]             PCP: Antonio Ayers, Antonio Halsted, MD Referring: Antonio Ayers, Antonio Ayers* Visit Date: 04/03/2020 Occupation: @GUAROCC @  Subjective:  No chief complaint on file.   History of Present Illness: Antonio Ayers is a 69 y.o. male ***   Activities of Daily Living:  Patient reports morning stiffness for *** {minute/hour:19697}.   Patient {ACTIONS;DENIES/REPORTS:21021675::"Denies"} nocturnal pain.  Difficulty dressing/grooming: {ACTIONS;DENIES/REPORTS:21021675::"Denies"} Difficulty climbing stairs: {ACTIONS;DENIES/REPORTS:21021675::"Denies"} Difficulty getting out of chair: {ACTIONS;DENIES/REPORTS:21021675::"Denies"} Difficulty using hands for taps, buttons, cutlery, and/or writing: {ACTIONS;DENIES/REPORTS:21021675::"Denies"}  No Rheumatology ROS completed.   PMFS History:  Patient Active Problem List   Diagnosis Date Noted  . Aortic aneurysm ([place])   . Aortic valve regurgitation 01/09/2019  . Nephrolithiasis 01/09/2019  . BPH (benign prostatic hyperplasia) 01/09/2019  . Overweight (BMI 25.0-29.9) 01/09/2019  . Primary osteoarthritis of both hips 12/29/2018  . Primary osteoarthritis of both knees 12/29/2018  . Raised intraocular pressure of both eyes 12/19/2018  . Ankylosing spondylitis of multiple sites in spine ([place]) 12/19/2018  . Hiatal hernia with GERD 11/08/2018  . History of colonoscopy 01/07/2018  . Gall stone in bile duct with infection of gallbladder 12/14/2017  . Hernia of abdominal cavity 12/07/2016  . Dislocated elbow, left, initial encounter 10/07/2016  . Enlarged liver 12/08/2006  . Aortic anomaly 01/07/2005  . Fracture 02/17/2004  . 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 Ankylosing spondylitis ([place])   . Aortic aneurysm ([facility])    mildly dilated ascending aorta at 73mm and aortic root 39mm by echo 03/2020  .  Aortic insufficiency   . Nephrolithiasis     Family History  Problem Relation Age of Onset  . Stroke Mother   . Stroke Father   . Heart failure Father   . Diabetes Sister   . Obesity Sister   . Hiatal hernia Sister    Past Surgical History:  Procedure Laterality Date  . CATARACT EXTRACTION Left 2016  . CHOLECYSTECTOMY  01/2018  . [place], 2014  . HERNIA REPAIR  2018  . HIATAL HERNIA REPAIR    . KIDNEY STONE SURGERY  03/2018   Social History   Social History Narrative  . Not on file   Immunization History  Administered Date(s) Administered  . Fluad Quad(high Dose 65+) 04/30/2019  . PFIZER SARS-COV-2 Vaccination 09/01/2019, 09/22/2019  . Pneumococcal Conjugate-13 02/22/2019  . Tdap 06/25/2019  . Zoster Recombinat (Shingrix) 02/22/2019, 04/30/2019     Objective: Vital Signs: There were no vitals taken for this visit.   Physical Exam   Musculoskeletal Exam: ***  CDAI Exam: CDAI Score: -- Patient Global: --; Provider Global: -- Swollen: --; Tender: -- Joint Exam 04/03/2020   No joint exam has been documented for this visit   There is currently no information documented on the homunculus. Go to the Rheumatology activity and complete the homunculus joint exam.  Investigation: No additional findings.  Imaging: CT CARDIAC SCORING  Addendum Date: 03/17/2020   ADDENDUM REPORT: 03/17/2020 21:31 CLINICAL DATA:  Risk stratification EXAM: Coronary Calcium Score TECHNIQUE: The patient was scanned on a [employer]. Axial non-contrast 3 mm slices were carried out through the heart. The data set was analyzed on a dedicated work station and scored using the  Agatson method. FINDINGS: Non-cardiac: See separate report from Antonio Ayers Ayers. Ascending Aorta: Borderline dilated at 14mm. Scattered calcifications. Pericardium: Normal Coronary arteries: Normal Coronary origins IMPRESSION: Coronary calcium score of 454. This was 71st percentile for age  and sex matched control. Antonio Ayers Electronically Signed   By: Antonio Ayers   On: 03/17/2020 21:31   Result Date: 03/17/2020 EXAM: OVER-READ INTERPRETATION  CT CHEST The following report is an over-read performed by radiologist Dr. Abigail Ayers of Antonio Ayers, Antonio Ayers on 03/17/2020. This over-read does not include interpretation of cardiac or coronary anatomy or pathology. The calcium score interpretation by the cardiologist is attached. COMPARISON:  CTA chest 04/11/2019 FINDINGS: Vascular: Aortic atherosclerosis. Mediastinum/Nodes: No imaged thoracic adenopathy. Small hiatal hernia with surgical changes at the gastroesophageal junction. Lungs/Pleura: No pleural fluid. Right lower lobe scarring, adjacent prominent osteophytes medially. Upper Abdomen: Normal imaged portions of the liver, spleen. Musculoskeletal: Moderate bilateral gynecomastia. Lower thoracic spondylosis. IMPRESSION: 1.  No acute findings in the imaged extracardiac chest. 2.  Aortic Atherosclerosis (ICD10-I70.0). 3. Bilateral gynecomastia. Electronically Signed: By: Antonio Ayers M.D. On: 03/17/2020 11:43   ECHOCARDIOGRAM COMPLETE  Result Date: 03/17/2020    ECHOCARDIOGRAM REPORT   Patient Name:   Antonio Ayers Date of Exam: 03/17/2020 Medical Rec #:  [id number]         Height:       73.0 in Accession #:    [id number]        Weight:       221.9 lb Date of Birth:  Jan 23, 1951         BSA:          2.249 m Patient Age:    26 years          BP:           113/69 mmHg Patient Gender: M                 HR:           77 bpm. Exam Location:  Antonio Ayers Procedure: 2D Echo, Cardiac Doppler and Color Doppler Indications:    I35.1 Nonrheumatic aortic valve  History:        Patient has prior history of Echocardiogram examinations, most                 recent 02/23/2019. Thoracic aortic aneurysm without rupture.  Sonographer:    Antonio Ayers RCS Referring Phys: Antonio Ayers  1. Left ventricular ejection fraction, by estimation, is 60  to 65%. The left ventricle has normal function. The left ventricle has no regional wall motion abnormalities. There is mild left ventricular hypertrophy. Left ventricular diastolic parameters are consistent with Grade I diastolic dysfunction (impaired relaxation).  2. Right ventricular systolic function is normal. The right ventricular size is normal. There is normal pulmonary artery systolic pressure.  3. The mitral valve is normal in structure. No evidence of mitral valve regurgitation. No evidence of mitral stenosis.  4. The aortic valve is tricuspid. Aortic valve regurgitation is trivial. Mild aortic valve sclerosis is present, with no evidence of aortic valve stenosis.  5. Aortic dilatation noted. There is mild dilatation of the ascending aorta measuring 39 mm.  6. The inferior vena cava is normal in size with greater than 50% respiratory variability, suggesting right atrial pressure of 3 mmHg. FINDINGS  Left Ventricle: Left ventricular ejection fraction, by estimation, is 60 to 65%. The left ventricle has normal function. The left ventricle has no regional wall motion  abnormalities. The left ventricular internal cavity size was normal in size. There is  mild left ventricular hypertrophy. Left ventricular diastolic parameters are consistent with Grade I diastolic dysfunction (impaired relaxation). Right Ventricle: The right ventricular size is normal. No increase in right ventricular wall thickness. Right ventricular systolic function is normal. There is normal pulmonary artery systolic pressure. The tricuspid regurgitant velocity is 1.81 m/s, and  with an assumed right atrial pressure of 3 mmHg, the estimated right ventricular systolic pressure is 64.4 mmHg. Left Atrium: Left atrial size was normal in size. Right Atrium: Right atrial size was normal in size. Pericardium: There is no evidence of pericardial effusion. Mitral Valve: The mitral valve is normal in structure. Normal mobility of the mitral valve  leaflets. No evidence of mitral valve regurgitation. No evidence of mitral valve stenosis. Tricuspid Valve: The tricuspid valve is normal in structure. Tricuspid valve regurgitation is mild . No evidence of tricuspid stenosis. Aortic Valve: The aortic valve is tricuspid. Aortic valve regurgitation is trivial. Aortic regurgitation PHT measures 764 msec. Mild aortic valve sclerosis is present, with no evidence of aortic valve stenosis. Pulmonic Valve: The pulmonic valve was normal in structure. Pulmonic valve regurgitation is trivial. No evidence of pulmonic stenosis. Aorta: Aortic dilatation noted. There is mild dilatation of the ascending aorta measuring 39 mm. Venous: The inferior vena cava is normal in size with greater than 50% respiratory variability, suggesting right atrial pressure of 3 mmHg. IAS/Shunts: No atrial level shunt detected by color flow Doppler.  LEFT VENTRICLE PLAX 2D LVIDd:         4.80 cm  Diastology LVIDs:         3.40 cm  LV e' lateral:   4.57 cm/s LV PW:         1.20 cm  LV E/e' lateral: 11.4 LV IVS:        1.00 cm  LV e' medial:    5.11 cm/s LVOT diam:     2.40 cm  LV E/e' medial:  10.2 LV SV:         82 LV SV Index:   37 LVOT Area:     4.52 cm  RIGHT VENTRICLE RV Basal diam:  3.80 cm RV S prime:     11.90 cm/s TAPSE (M-mode): 1.9 cm RVSP:           16.1 mmHg LEFT ATRIUM             Index       RIGHT ATRIUM           Index LA diam:        2.90 cm 1.29 cm/m  RA Pressure: 3.00 mmHg LA Vol (A2C):   40.7 ml 18.08 ml/m RA Area:     16.10 cm LA Vol (A4C):   33.5 ml 14.91 ml/m RA Volume:   42.40 ml  18.85 ml/m LA Biplane Vol: 33.6 ml 14.94 ml/m  AORTIC VALVE LVOT Vmax:   97.90 cm/s LVOT Vmean:  61.200 cm/s LVOT VTI:    0.182 m AI PHT:      764 msec  AORTA Ao Root diam: 3.90 cm Ao Asc diam:  3.80 cm MITRAL VALVE               TRICUSPID VALVE MV Area (PHT):             TR Peak grad:   13.1 mmHg MV Decel Time:             TR Vmax:  181.00 cm/s MV E velocity: 52.30 cm/s  Estimated RAP:   3.00 mmHg MV A velocity: 72.80 cm/s  RVSP:           16.1 mmHg MV E/A ratio:  0.72                            SHUNTS                            Systemic VTI:  0.18 m                            Systemic Diam: 2.40 cm Candee Furbish MD Electronically signed by Candee Furbish MD Signature Date/Time: 03/17/2020/1:59:45 PM    Final     Recent Labs: Lab Results  Component Value Date   WBC 6.9 11/02/2019   HGB 16.0 11/02/2019   PLT 191 11/02/2019   NA 140 11/02/2019   K 3.9 11/02/2019   CL 102 11/02/2019   CO2 28 11/02/2019   GLUCOSE 102 (H) 11/02/2019   BUN 14 11/02/2019   CREATININE 0.65 (L) 11/02/2019   BILITOT 2.9 (H) 11/02/2019   ALKPHOS 68 06/22/2019   AST 21 11/02/2019   ALT 23 11/02/2019   PROT 6.6 11/02/2019   ALBUMIN 4.5 06/22/2019   CALCIUM 9.5 11/02/2019   GFRAA 115 11/02/2019   QFTBGOLDPLUS NEGATIVE 01/01/2020    Speciality Comments: No specialty comments available.  Procedures:  No procedures performed Allergies: Patient has no known allergies.   Assessment / Plan:     Visit Diagnoses: No diagnosis found.  Orders: No orders of the defined types were placed in this encounter.  No orders of the defined types were placed in this encounter.   Face-to-face time spent with patient was *** minutes. Greater than 50% of time was spent in counseling and coordination of care.  Follow-Up Instructions: No follow-ups on file.   Earnestine Mealing, CMA  Note - This record has been created using Editor, commissioning.  Chart creation errors have been sought, but may not always  have been located. Such creation errors do not reflect on  the standard of medical care.

## 2020-03-21 ENCOUNTER — Encounter: Payer: Self-pay | Admitting: Internal Medicine

## 2020-03-22 ENCOUNTER — Encounter: Payer: Self-pay | Admitting: Rheumatology

## 2020-03-24 ENCOUNTER — Encounter: Payer: Self-pay | Admitting: Rheumatology

## 2020-03-24 ENCOUNTER — Encounter: Payer: Self-pay | Admitting: Internal Medicine

## 2020-03-24 ENCOUNTER — Encounter: Payer: Self-pay | Admitting: *Deleted

## 2020-03-24 ENCOUNTER — Telehealth: Payer: Self-pay | Admitting: *Deleted

## 2020-03-24 ENCOUNTER — Other Ambulatory Visit: Payer: Medicare Other

## 2020-03-24 DIAGNOSIS — Z79899 Other long term (current) drug therapy: Secondary | ICD-10-CM

## 2020-03-24 NOTE — Telephone Encounter (Signed)
Error

## 2020-03-25 ENCOUNTER — Other Ambulatory Visit: Payer: Self-pay

## 2020-03-25 ENCOUNTER — Other Ambulatory Visit: Payer: Medicare Other

## 2020-03-25 ENCOUNTER — Telehealth: Payer: Medicare Other

## 2020-03-25 DIAGNOSIS — I2510 Atherosclerotic heart disease of native coronary artery without angina pectoris: Secondary | ICD-10-CM | POA: Diagnosis not present

## 2020-03-25 DIAGNOSIS — I712 Thoracic aortic aneurysm, without rupture, unspecified: Secondary | ICD-10-CM

## 2020-03-25 LAB — LIPID PANEL
Chol/HDL Ratio: 2.3 ratio (ref 0.0–5.0)
Cholesterol, Total: 135 mg/dL (ref 100–199)
HDL: 59 mg/dL (ref >39)
LDL Chol Calc (NIH): 65 mg/dL (ref 0–99)
Triglycerides: 48 mg/dL (ref 0–149)
VLDL Cholesterol Cal: 11 mg/dL (ref 5–40)

## 2020-03-26 LAB — CBC WITH DIFFERENTIAL/PLATELET
Basophils Absolute: 0 10*3/uL (ref 0.0–0.2)
Basos: 0 %
EOS (ABSOLUTE): 0.1 10*3/uL (ref 0.0–0.4)
Eos: 1 %
Hematocrit: 43.4 % (ref 37.5–51.0)
Hemoglobin: 16 g/dL (ref 13.0–17.7)
Immature Grans (Abs): 0 10*3/uL (ref 0.0–0.1)
Immature Granulocytes: 1 %
Lymphocytes Absolute: 1.2 10*3/uL (ref 0.7–3.1)
Lymphs: 16 %
MCH: 33.8 pg — ABNORMAL HIGH (ref 26.6–33.0)
MCHC: 36.9 g/dL — ABNORMAL HIGH (ref 31.5–35.7)
MCV: 92 fL (ref 79–97)
Monocytes Absolute: 0.8 10*3/uL (ref 0.1–0.9)
Monocytes: 12 %
Neutrophils Absolute: 5 10*3/uL (ref 1.4–7.0)
Neutrophils: 70 %
Platelets: 202 10*3/uL (ref 150–450)
RBC: 4.73 x10E6/uL (ref 4.14–5.80)
RDW: 12.8 % (ref 11.6–15.4)
WBC: 7.1 10*3/uL (ref 3.4–10.8)

## 2020-03-26 LAB — CMP14+EGFR
ALT: 18 IU/L (ref 0–44)
AST: 16 IU/L (ref 0–40)
Albumin/Globulin Ratio: 2.1 (ref 1.2–2.2)
Albumin: 4.5 g/dL (ref 3.8–4.8)
Alkaline Phosphatase: 85 IU/L (ref 48–121)
BUN/Creatinine Ratio: 18 (ref 10–24)
BUN: 12 mg/dL (ref 8–27)
Bilirubin Total: 2.7 mg/dL — ABNORMAL HIGH (ref 0.0–1.2)
CO2: 23 mmol/L (ref 20–29)
Calcium: 9 mg/dL (ref 8.6–10.2)
Chloride: 97 mmol/L (ref 96–106)
Creatinine, Ser: 0.68 mg/dL — ABNORMAL LOW (ref 0.76–1.27)
GFR calc Af Amer: 112 mL/min/{1.73_m2} (ref >59)
GFR calc non Af Amer: 97 mL/min/{1.73_m2} (ref >59)
Globulin, Total: 2.1 g/dL (ref 1.5–4.5)
Glucose: 114 mg/dL — ABNORMAL HIGH (ref 65–99)
Potassium: 3.8 mmol/L (ref 3.5–5.2)
Sodium: 134 mmol/L (ref 134–144)
Total Protein: 6.6 g/dL (ref 6.0–8.5)

## 2020-03-26 NOTE — Telephone Encounter (Signed)
I will discuss results at the follow-up visit.

## 2020-03-26 NOTE — Progress Notes (Signed)
Office Visit Note  Patient: Antonio Ayers             Date of Birth: [birth year]           MRN: [id number]             PCP: [name], [name], MD Referring: [name], [place]* Visit Date: 04/01/2020 Occupation: @GUAROCC @  Subjective:  Joint stiffness   History of Present Illness: Antonio Ayers is a 69 y.o. male with history of ankylosing spondylitis.  He is currently on Humira 40 mg sq injections once every 14 days.  He denies any recent flares.  He experiences generalized joint stiffness every morning and if sitting for prolonged periods of time.  He has stiffness in his lower back and hip joints.  He sprained his right ankle joint last week and continues to have some joint swelling but his discomfort is improving.  He denies any other new concerns at this time.  He has received both covid-19 vaccinations and received the booster on 03/30/20. He denies any recent infections.   Activities of Daily Living:  Patient reports morning stiffness for 10 minutes.   Patient Denies nocturnal pain.  Difficulty dressing/grooming: Reports Difficulty climbing stairs: Reports Difficulty getting out of chair: Reports Difficulty using hands for taps, buttons, cutlery, and/or writing: Denies  Review of Systems  Constitutional: Positive for fatigue.  HENT: Positive for mouth dryness.   Eyes: Positive for dryness.  Respiratory: Negative for shortness of breath.   Cardiovascular: Positive for swelling in legs/feet.  Gastrointestinal: Positive for constipation and diarrhea.  Endocrine: Positive for increased urination.  Genitourinary: Negative for difficulty urinating.  Musculoskeletal: Positive for arthralgias, gait problem, joint pain, joint swelling and morning stiffness.  Skin: Positive for rash.  Allergic/Immunologic: Negative for susceptible to infections.  Neurological: Negative for numbness.  Hematological: Negative for bruising/bleeding tendency.    Psychiatric/Behavioral: Positive for sleep disturbance.    PMFS History:  Patient Active Problem List   Diagnosis Date Noted   Aortic aneurysm ([place])    Aortic valve regurgitation 01/09/2019   Nephrolithiasis 01/09/2019   BPH (benign prostatic hyperplasia) 01/09/2019   Overweight (BMI 25.0-29.9) 01/09/2019   Primary osteoarthritis of both hips 12/29/2018   Primary osteoarthritis of both knees 12/29/2018   Raised intraocular pressure of both eyes 12/19/2018   Ankylosing spondylitis of multiple sites in spine ([place]) 12/19/2018   Hiatal hernia with GERD 11/08/2018   History of colonoscopy 01/07/2018   Gall stone in bile duct with infection of gallbladder 12/14/2017   Hernia of abdominal cavity 12/07/2016   Dislocated elbow, left, initial encounter 10/07/2016   Enlarged liver 12/08/2006   Aortic anomaly 01/07/2005   Fracture 02/17/2004   Keratoconus 12/07/1977    Past Medical History:  Diagnosis Date   Ankylosing spondylitis ([place])    Aortic aneurysm ([facility])    mildly dilated ascending aorta at 24mm and aortic root 76mm by echo 03/2020   Aortic insufficiency    Nephrolithiasis     Family History  Problem Relation Age of Onset   Stroke Mother    Stroke Father    Heart failure Father    Diabetes Sister    Obesity Sister    Hiatal hernia Sister    Past Surgical History:  Procedure Laterality Date   CATARACT EXTRACTION Left 2016   CHOLECYSTECTOMY  01/2018   CORNEAL TRANSPLANT  1989, 1992, 2014   HERNIA REPAIR  2018   Cable SURGERY  03/2018  Social History   Social History Narrative   Not on file   Immunization History  Administered Date(s) Administered   Fluad Quad(high Dose 65+) 04/30/2019   PFIZER SARS-COV-2 Vaccination 09/01/2019, 09/22/2019, 03/30/2020   Pneumococcal Conjugate-13 02/22/2019   Tdap 06/25/2019   Zoster Recombinat (Shingrix) 02/22/2019, 04/30/2019     Objective: Vital  Signs: BP 103/70 (BP Location: Left Arm, Patient Position: Sitting, Cuff Size: Small)    Pulse 76    Resp 16    Ht 6\' 1"  (1.854 m)    Wt 218 lb 3.2 oz (99 kg)    BMI 28.79 kg/m    Physical Exam Vitals and nursing note reviewed.  Constitutional:      Appearance: He is well-developed.  HENT:     Head: Normocephalic and atraumatic.  Eyes:     Conjunctiva/sclera: Conjunctivae normal.     Pupils: Pupils are equal, round, and reactive to light.  Pulmonary:     Effort: Pulmonary effort is normal.  Abdominal:     Palpations: Abdomen is soft.  Musculoskeletal:     Cervical back: Normal range of motion and neck supple.  Skin:    General: Skin is warm and dry.     Capillary Refill: Capillary refill takes less than 2 seconds.  Neurological:     Mental Status: He is alert and oriented to person, place, and time.  Psychiatric:        Behavior: Behavior normal.      Musculoskeletal Exam: C-spine very limited range of motion with crepitus.  Thoracic kyphosis noted.  Limited range of motion of thoracic and lumbar spine.  Shoulder joint abduction to about 100 degrees bilaterally.  Elbow joints, wrist joints, MCPs, PIPs, DIPs have good range of motion with no synovitis.  Hip joints have limited range of motion with some discomfort.  Limited extension of both knee joints.  No warmth or effusion of knee joints noted.  He has some tenderness and inflammation of the right ankle joint.  CDAI Exam: CDAI Score: -- Patient Global: --; Provider Global: -- Swollen: --; Tender: -- Joint Exam 04/01/2020   No joint exam has been documented for this visit   There is currently no information documented on the homunculus. Go to the Rheumatology activity and complete the homunculus joint exam.  Investigation: No additional findings.  Imaging: CT CARDIAC SCORING  Addendum Date: 03/17/2020   ADDENDUM REPORT: 03/17/2020 21:31 CLINICAL DATA:  Risk stratification EXAM: Coronary Calcium Score TECHNIQUE: The  patient was scanned on a [employer]. Axial non-contrast 3 mm slices were carried out through the heart. The data set was analyzed on a dedicated work station and scored using the [place]. FINDINGS: Non-cardiac: See separate report from [facility]. Ascending Aorta: Borderline dilated at 54mm. Scattered calcifications. Pericardium: Normal Coronary arteries: Normal Coronary origins IMPRESSION: Coronary calcium score of 454. This was 71st percentile for age and sex matched control. [name] Electronically Signed   By: [name]   On: 03/17/2020 21:31   Result Date: 03/17/2020 EXAM: OVER-READ INTERPRETATION  CT CHEST The following report is an over-read performed by radiologist Dr. Abigail Miyamoto of [facility], [place] on 03/17/2020. This over-read does not include interpretation of cardiac or coronary anatomy or pathology. The calcium score interpretation by the cardiologist is attached. COMPARISON:  CTA chest 04/11/2019 FINDINGS: Vascular: Aortic atherosclerosis. Mediastinum/Nodes: No imaged thoracic adenopathy. Small hiatal hernia with surgical changes at the gastroesophageal junction. Lungs/Pleura: No pleural fluid. Right lower lobe scarring, adjacent prominent osteophytes  medially. Upper Abdomen: Normal imaged portions of the liver, spleen. Musculoskeletal: Moderate bilateral gynecomastia. Lower thoracic spondylosis. IMPRESSION: 1.  No acute findings in the imaged extracardiac chest. 2.  Aortic Atherosclerosis (ICD10-I70.0). 3. Bilateral gynecomastia. Electronically Signed: By: Abigail Miyamoto M.D. On: 03/17/2020 11:43   ECHOCARDIOGRAM COMPLETE  Result Date: 03/17/2020    ECHOCARDIOGRAM REPORT   Patient Name:   [facility] Date of Exam: 03/17/2020 Medical Rec #:  [id number]         Height:       73.0 in Accession #:    [id number]        Weight:       221.9 lb Date of Birth:  [birth year]         BSA:          2.249 m Patient Age:    26 years          BP:           113/69 mmHg  Patient Gender: M                 HR:           77 bpm. Exam Location:  [facility] Procedure: 2D Echo, Cardiac Doppler and Color Doppler Indications:    I35.1 Nonrheumatic aortic valve  History:        Patient has prior history of Echocardiogram examinations, most                 recent 02/23/2019. Thoracic aortic aneurysm without rupture.  Sonographer:    Marygrace Drought RCS Referring Phys: [place]  1. Left ventricular ejection fraction, by estimation, is 60 to 65%. The left ventricle has normal function. The left ventricle has no regional wall motion abnormalities. There is mild left ventricular hypertrophy. Left ventricular diastolic parameters are consistent with Grade I diastolic dysfunction (impaired relaxation).  2. Right ventricular systolic function is normal. The right ventricular size is normal. There is normal pulmonary artery systolic pressure.  3. The mitral valve is normal in structure. No evidence of mitral valve regurgitation. No evidence of mitral stenosis.  4. The aortic valve is tricuspid. Aortic valve regurgitation is trivial. Mild aortic valve sclerosis is present, with no evidence of aortic valve stenosis.  5. Aortic dilatation noted. There is mild dilatation of the ascending aorta measuring 39 mm.  6. The inferior vena cava is normal in size with greater than 50% respiratory variability, suggesting right atrial pressure of 3 mmHg. FINDINGS  Left Ventricle: Left ventricular ejection fraction, by estimation, is 60 to 65%. The left ventricle has normal function. The left ventricle has no regional wall motion abnormalities. The left ventricular internal cavity size was normal in size. There is  mild left ventricular hypertrophy. Left ventricular diastolic parameters are consistent with Grade I diastolic dysfunction (impaired relaxation). Right Ventricle: The right ventricular size is normal. No increase in right ventricular wall thickness. Right ventricular systolic  function is normal. There is normal pulmonary artery systolic pressure. The tricuspid regurgitant velocity is 1.81 m/s, and  with an assumed right atrial pressure of 3 mmHg, the estimated right ventricular systolic pressure is 41.6 mmHg. Left Atrium: Left atrial size was normal in size. Right Atrium: Right atrial size was normal in size. Pericardium: There is no evidence of pericardial effusion. Mitral Valve: The mitral valve is normal in structure. Normal mobility of the mitral valve leaflets. No evidence of mitral valve regurgitation. No evidence of mitral valve stenosis. Tricuspid Valve:  The tricuspid valve is normal in structure. Tricuspid valve regurgitation is mild . No evidence of tricuspid stenosis. Aortic Valve: The aortic valve is tricuspid. Aortic valve regurgitation is trivial. Aortic regurgitation PHT measures 764 msec. Mild aortic valve sclerosis is present, with no evidence of aortic valve stenosis. Pulmonic Valve: The pulmonic valve was normal in structure. Pulmonic valve regurgitation is trivial. No evidence of pulmonic stenosis. Aorta: Aortic dilatation noted. There is mild dilatation of the ascending aorta measuring 39 mm. Venous: The inferior vena cava is normal in size with greater than 50% respiratory variability, suggesting right atrial pressure of 3 mmHg. IAS/Shunts: No atrial level shunt detected by color flow Doppler.  LEFT VENTRICLE PLAX 2D LVIDd:         4.80 cm  Diastology LVIDs:         3.40 cm  LV e' lateral:   4.57 cm/s LV PW:         1.20 cm  LV E/e' lateral: 11.4 LV IVS:        1.00 cm  LV e' medial:    5.11 cm/s LVOT diam:     2.40 cm  LV E/e' medial:  10.2 LV SV:         82 LV SV Index:   37 LVOT Area:     4.52 cm  RIGHT VENTRICLE RV Basal diam:  3.80 cm RV S prime:     11.90 cm/s TAPSE (M-mode): 1.9 cm RVSP:           16.1 mmHg LEFT ATRIUM             Index       RIGHT ATRIUM           Index LA diam:        2.90 cm 1.29 cm/m  RA Pressure: 3.00 mmHg LA Vol (A2C):   40.7 ml 18.08  ml/m RA Area:     16.10 cm LA Vol (A4C):   33.5 ml 14.91 ml/m RA Volume:   42.40 ml  18.85 ml/m LA Biplane Vol: 33.6 ml 14.94 ml/m  AORTIC VALVE LVOT Vmax:   97.90 cm/s LVOT Vmean:  61.200 cm/s LVOT VTI:    0.182 m AI PHT:      764 msec  AORTA Ao Root diam: 3.90 cm Ao Asc diam:  3.80 cm MITRAL VALVE               TRICUSPID VALVE MV Area (PHT):             TR Peak grad:   13.1 mmHg MV Decel Time:             TR Vmax:        181.00 cm/s MV E velocity: 52.30 cm/s  Estimated RAP:  3.00 mmHg MV A velocity: 72.80 cm/s  RVSP:           16.1 mmHg MV E/A ratio:  0.72                            SHUNTS                            Systemic VTI:  0.18 m                            Systemic Diam: 2.40 cm Candee Furbish MD Electronically signed by Elta Guadeloupe  Skains MD Signature Date/Time: 03/17/2020/1:59:45 PM    Final     Recent Labs: Lab Results  Component Value Date   WBC 7.1 03/25/2020   HGB 16.0 03/25/2020   PLT 202 03/25/2020   NA 134 03/25/2020   K 3.8 03/25/2020   CL 97 03/25/2020   CO2 23 03/25/2020   GLUCOSE 114 (H) 03/25/2020   BUN 12 03/25/2020   CREATININE 0.68 (L) 03/25/2020   BILITOT 2.7 (H) 03/25/2020   ALKPHOS 85 03/25/2020   AST 16 03/25/2020   ALT 18 03/25/2020   PROT 6.6 03/25/2020   ALBUMIN 4.5 03/25/2020   CALCIUM 9.0 03/25/2020   GFRAA 112 03/25/2020   QFTBGOLDPLUS NEGATIVE 01/01/2020    Speciality Comments: No specialty comments available.  Procedures:  No procedures performed Allergies: Patient has no known allergies.   Assessment / Plan:     Visit Diagnoses: Ankylosing spondylitis of multiple sites in spine [facility]) - Diagnosed in [place].  Last seen by rheumatologist June 07, 2018: He has not had any signs or symptoms of a flare recently.  He is clinically doing well on Humira 40 mg subcutaneous injections every 14 days.  He experiences stiffness in his neck, SI joints, and both hip joints first thing in the morning and after sitting for prolonged periods of time.  He has  limited range of motion of both hip joints on exam.  Limited extension of both knees noted.  He will continue on Humira 40 mg IV days injections every 14 days.  He was advised to notify us if he develops increased joint pain or inflammation.  He will follow-up in the office in 5 months.   High risk medication use - Humira 40 mg sq injections every 14 days.  CBC and CMP were drawn on 03/25/2020.  He will be due to update lab work in November and every 3 months to monitor for drug toxicity.  Standing orders for CBC and CMP are in place.  TB gold was negative on 01/01/2020 and will continue to be monitored yearly. He was advised to hold Humira if he develops signs or symptoms of an infection and to resume once the infection has completely cleared.  He has received both COVID-19 vaccinations and received the booster dose on 03/30/2020.  He plans on holding Humira for several weeks to try to increase the magnitude and duration of his immunity.  He was advised to notify us or his PCP if he develops a COVID-19 infection in order to receive the antibody infusion.  He voiced understanding.  He was encouraged to continue to wear a mask and social distance  Primary osteoarthritis of both hips: He has limited range of motion of both hip joints.  He experiences stiffness in both hips especially after sitting for long periods of time.  Primary osteoarthritis of both knees: He has limited extension of both knee joints.  No warmth or effusion was noted.  Abnormal SPEP  Raised intraocular pressure of both eyes  Other fatigue   Orders: No orders of the defined types were placed in this encounter.  No orders of the defined types were placed in this encounter.    Follow-Up Instructions: Return in about 5 months (around 09/01/2020) for Ankylosing spondylitis.   Bo Merino, MD   Scribed by-  Hazel Sams PA-C  Note - This record has been created using Dragon software.  Chart creation errors have been  sought, but may not always  have been located. Such creation errors do  not reflect on  the standard of medical care.

## 2020-03-28 ENCOUNTER — Encounter: Payer: Self-pay | Admitting: Internal Medicine

## 2020-04-01 ENCOUNTER — Other Ambulatory Visit: Payer: Self-pay

## 2020-04-01 ENCOUNTER — Encounter: Payer: Self-pay | Admitting: Rheumatology

## 2020-04-01 ENCOUNTER — Ambulatory Visit: Payer: Medicare Other | Admitting: Rheumatology

## 2020-04-01 VITALS — BP 103/70 | HR 76 | Resp 16 | Ht 73.0 in | Wt 218.2 lb

## 2020-04-01 DIAGNOSIS — R778 Other specified abnormalities of plasma proteins: Secondary | ICD-10-CM | POA: Diagnosis not present

## 2020-04-01 DIAGNOSIS — M450 Ankylosing spondylitis of multiple sites in spine: Secondary | ICD-10-CM | POA: Diagnosis not present

## 2020-04-01 DIAGNOSIS — Z79899 Other long term (current) drug therapy: Secondary | ICD-10-CM | POA: Diagnosis not present

## 2020-04-01 DIAGNOSIS — M160 Bilateral primary osteoarthritis of hip: Secondary | ICD-10-CM | POA: Diagnosis not present

## 2020-04-01 DIAGNOSIS — M170 Bilateral primary osteoarthritis of knee: Secondary | ICD-10-CM | POA: Diagnosis not present

## 2020-04-01 DIAGNOSIS — H40053 Ocular hypertension, bilateral: Secondary | ICD-10-CM

## 2020-04-01 DIAGNOSIS — R5383 Other fatigue: Secondary | ICD-10-CM

## 2020-04-01 MED ORDER — HUMIRA (2 PEN) 40 MG/0.4ML ~~LOC~~ AJKT: 40.0000 mg | AUTO-INJECTOR | SUBCUTANEOUS | 0 refills | Status: DC

## 2020-04-01 NOTE — Patient Instructions (Addendum)
Standing Labs We placed an order today for your standing lab work.   Please have your standing labs drawn in November and every 3 months  If possible, please have your labs drawn 2 weeks prior to your appointment so that the provider can discuss your results at your appointment.  We have open lab daily Monday through Thursday from 8:30-12:30 PM and 1:30-4:30 PM and Friday from 8:30-12:30 PM and 1:30-4:00 PM at the office of Dr. Bo Merino, [facility].   Please be advised, patients with office appointments requiring lab work will take precedents over walk-in lab work.  If possible, please come for your lab work on Monday and Friday afternoons, as you may experience shorter wait times. The office is located at [address] [place], [place], [place] [address] No appointment is necessary.   Labs are drawn by [facility]. Please bring your co-pay at the time of your lab draw.  You may receive a bill from [facility] for your lab work.  If you wish to have your labs drawn at another location, please call the office 24 hours in advance to send orders.  If you have any questions regarding directions or hours of operation,  please call [phone].   As a reminder, please drink plenty of water prior to coming for your lab work. Thanks!    COVID-19 vaccine recommendations:   COVID-19 vaccine is recommended for everyone (unless you are allergic to a vaccine component), even if you are on a medication that suppresses your immune system.   If you are on Methotrexate, Cellcept (mycophenolate), Rinvoq, Morrie Sheldon, and Olumiant- hold the medication for 1 week after each vaccine. Hold Methotrexate for 2 weeks after the single dose COVID-19 vaccine.   If you are on Orencia subcutaneous injection - hold medication one week prior to and one week after the first COVID-19 vaccine dose (only).   If you are on Orencia IV infusions- time vaccination administration so that the first COVID-19  vaccination will occur four weeks after the infusion and postpone the subsequent infusion by one week.   If you are on Cyclophosphamide or Rituxan infusions please contact your doctor prior to receiving the COVID-19 vaccine.   Do not take Tylenol or any anti-inflammatory medications (NSAIDs) 24 hours prior to the COVID-19 vaccination.   There is no direct evidence about the efficacy of the COVID-19 vaccine in individuals who are on medications that suppress the immune system.   Even if you are fully vaccinated, and you are on any medications that suppress your immune system, please continue to wear a mask, maintain at least six feet social distance and practice hand hygiene.   If you develop a COVID-19 infection, please contact your PCP or our office to determine if you need antibody infusion.  The booster vaccine is now available for immunocompromised patients. It is advised that if you had Pfizer vaccine you should get [employer] booster.  If you had a Moderna vaccine then you should get a Moderna booster. Johnson and Wynetta Emery does not have a booster vaccine at this time.  Please see the following web sites for updated information.   [email or web address]://[email or web address]/[email or web address]/0/Files/COVID-19-Vaccination-Patient-Resources.[email or web address]  https://[email or web address].org/About-Us/Newsroom/Press-Releases/ID/1159

## 2020-04-01 NOTE — Telephone Encounter (Signed)
Last Visit: 04/01/2020 Next Visit: 09/03/2020 Labs: 03/25/2020 glucose 114, creatinine 0.68, bilirubin total 2.7, MCH 33.8, MCHC 36.9. TB Gold: 01/01/2020 negative   Current Dose per office note on 04/01/2020: Humira 40 mg sq injections every 14 days.  [id number] spondylitis of multiple sites in spine  Okay to refill humira?

## 2020-04-03 ENCOUNTER — Ambulatory Visit: Payer: Medicare Other | Admitting: Sports Medicine

## 2020-04-03 ENCOUNTER — Ambulatory Visit: Payer: Medicare Other | Admitting: Rheumatology

## 2020-04-10 ENCOUNTER — Other Ambulatory Visit: Payer: Self-pay

## 2020-04-10 ENCOUNTER — Encounter: Payer: Self-pay | Admitting: Sports Medicine

## 2020-04-10 ENCOUNTER — Ambulatory Visit: Payer: Medicare Other | Admitting: Sports Medicine

## 2020-04-10 DIAGNOSIS — M79674 Pain in right toe(s): Secondary | ICD-10-CM

## 2020-04-10 DIAGNOSIS — B351 Tinea unguium: Secondary | ICD-10-CM | POA: Diagnosis not present

## 2020-04-10 DIAGNOSIS — M79675 Pain in left toe(s): Secondary | ICD-10-CM

## 2020-04-10 NOTE — Progress Notes (Signed)
Subjective: Antonio Ayers is a 69 y.o. male patient seen today in office with complaint of mildly painful thickened and elongated toenails; unable to trim. Has a history of AS on [place] like previous. Patient has no other pedal complaints at this time. No changes with medical history since last encounter.   Patient Active Problem List   Diagnosis Date Noted   Aortic aneurysm ([place])    Aortic valve regurgitation 01/09/2019   Nephrolithiasis 01/09/2019   BPH (benign prostatic hyperplasia) 01/09/2019   Overweight (BMI 25.0-29.9) 01/09/2019   Primary osteoarthritis of both hips 12/29/2018   Primary osteoarthritis of both knees 12/29/2018   Raised intraocular pressure of both eyes 12/19/2018   Ankylosing spondylitis of multiple sites in spine ([place]) 12/19/2018   Hiatal hernia with GERD 11/08/2018   History of colonoscopy 01/07/2018   Gall stone in bile duct with infection of gallbladder 12/14/2017   Hernia of abdominal cavity 12/07/2016   Dislocated elbow, left, initial encounter 10/07/2016   Enlarged liver 12/08/2006   Aortic anomaly 01/07/2005   Fracture 02/17/2004   Keratoconus 12/07/1977    Current Outpatient Medications on File Prior to Visit  Medication Sig Dispense Refill   Adalimumab (HUMIRA PEN) 40 MG/0.4ML PNKT Inject 40 mg into the skin every 14 (fourteen) days. 3 each 0   Cholecalciferol (D3-1000) 25 MCG (1000 UT) capsule Take 1,000 Units by mouth daily.      COMBIGAN 0.2-0.5 % ophthalmic solution INT 1 GTT IN OU BID     indapamide (LOZOL) 1.25 MG tablet TK 1 T PO QD     latanoprost (XALATAN) 0.005 % ophthalmic solution INT 1 GTT IN OS QPM B DINNER     LOTEMAX 0.5 % GEL INT 1 GTT IN OS D     Polyethylene Glycol 3350 (MIRALAX PO) Take by mouth as needed.     potassium citrate (UROCIT-K) 10 MEQ (1080 MG) SR tablet 3 (three) times daily.     No current facility-administered medications on file prior to visit.    No Known  Allergies  Objective: Physical Exam  General: Well developed, nourished, no acute distress, awake, alert and oriented x 3  Vascular: Dorsalis pedis artery 2/4 bilateral, Posterior tibial artery 1/4 bilateral, skin temperature warm to warm proximal to distal bilateral lower extremities, no varicosities, pedal hair present bilateral.  Neurological: Gross sensation present via light touch bilateral.   Dermatological: Skin is warm, dry, and supple bilateral, Nails 1-10 are tender, long, thick, and discolored with mild subungal debris, no webspace macerations present bilateral, no open lesions present bilateral, no callus/corns/hyperkeratotic tissue present bilateral. No signs of infection bilateral.  Musculoskeletal:  Asymptomatic hammertoe boney deformities noted bilateral. Muscular strength within normal limits without painon range of motion. No pain with calf compression bilateral.  Assessment and Plan:  Problem List Items Addressed This Visit    None    Visit Diagnoses    Pain due to onychomycosis of toenails of both feet    -  Primary     -Examined patient.  -Re-Discussed treatment options for painful mycotic nails. -Mechanically debrided and reduced mycotic nails with sterile nail nipper and dremel nail file without incident. -Patient to return in 3 months for follow up evaluation or sooner if symptoms worsen.  Landis Martins, DPM

## 2020-04-12 DIAGNOSIS — T1502XA Foreign body in cornea, left eye, initial encounter: Secondary | ICD-10-CM | POA: Diagnosis not present

## 2020-04-12 DIAGNOSIS — S0502XA Injury of conjunctiva and corneal abrasion without foreign body, left eye, initial encounter: Secondary | ICD-10-CM | POA: Diagnosis not present

## 2020-04-15 DIAGNOSIS — S0502XD Injury of conjunctiva and corneal abrasion without foreign body, left eye, subsequent encounter: Secondary | ICD-10-CM | POA: Diagnosis not present

## 2020-04-16 ENCOUNTER — Other Ambulatory Visit: Payer: Self-pay | Admitting: Internal Medicine

## 2020-04-16 DIAGNOSIS — K449 Diaphragmatic hernia without obstruction or gangrene: Secondary | ICD-10-CM

## 2020-04-16 DIAGNOSIS — M170 Bilateral primary osteoarthritis of knee: Secondary | ICD-10-CM

## 2020-04-16 DIAGNOSIS — I712 Thoracic aortic aneurysm, without rupture, unspecified: Secondary | ICD-10-CM

## 2020-04-16 DIAGNOSIS — M450 Ankylosing spondylitis of multiple sites in spine: Secondary | ICD-10-CM

## 2020-04-16 DIAGNOSIS — I351 Nonrheumatic aortic (valve) insufficiency: Secondary | ICD-10-CM

## 2020-04-16 DIAGNOSIS — N200 Calculus of kidney: Secondary | ICD-10-CM

## 2020-04-21 ENCOUNTER — Telehealth: Payer: Self-pay | Admitting: Pharmacist

## 2020-04-21 DIAGNOSIS — S0502XD Injury of conjunctiva and corneal abrasion without foreign body, left eye, subsequent encounter: Secondary | ICD-10-CM | POA: Diagnosis not present

## 2020-04-22 ENCOUNTER — Telehealth: Payer: Medicare Other

## 2020-04-22 NOTE — Progress Notes (Signed)
Patient unable to make appointment on 0914/2021 at 11:00 AM. His appointment was changed to 05/06/20 at 1:00 pm. Watt Climes CPP was informed, and appointment was changed.

## 2020-04-23 ENCOUNTER — Ambulatory Visit: Payer: Medicare Other | Admitting: Gastroenterology

## 2020-04-23 DIAGNOSIS — Z87442 Personal history of urinary calculi: Secondary | ICD-10-CM | POA: Diagnosis not present

## 2020-04-23 DIAGNOSIS — R82994 Hypercalciuria: Secondary | ICD-10-CM | POA: Diagnosis not present

## 2020-04-25 ENCOUNTER — Encounter: Payer: Self-pay | Admitting: Internal Medicine

## 2020-04-25 ENCOUNTER — Encounter: Payer: Self-pay | Admitting: Rheumatology

## 2020-04-28 NOTE — Telephone Encounter (Signed)
ACR does not recommend any modification of TNFi dosing or vaccination timing.   We still do not know the magnitude or duration of immunity in patients who are immunosuppressed, so if he is clinically doing well a temporary interruption of humira is preferred.    Ok to resume humira on 04/28/20.   We recommend continuing to wear a mask and social distancing.

## 2020-04-29 ENCOUNTER — Telehealth: Payer: Self-pay | Admitting: Pharmacist

## 2020-04-29 NOTE — Chronic Care Management (AMB) (Signed)
    Chronic Care Management Pharmacy Assistant   Name: Antonio Ayers  MRN: [id number] DOB: [birth year]  Reason for Encounter: Medication Review/Initial Questions for Pharmacist visit on 05/06/2020.  Patient Questions: 1. Have you seen any other providers since your last visit? yes 2. Any changes in your medications or health? No 3. Any side effects from any medications? No 4. Do you have any symptoms or problems not managed by your medications? No 5. Any concerns about your health right now? He states he is aware of all his conditions. 6. Has your provider asked that you check blood pressure, blood sugar, or follow a special diet at home?  He states he sometimes checks it at home but not on regular basis. 7. Do you get any type of exercise regularly? He walks 2-3 times a week for about 15 -30in 8. Can you think of a goal you would like to reach for your health? He would like to lose about 15 lbs. and have his vision checked. 9. Do you have any problems getting your medications? No 10. Is there anything that you would like to discuss during the appointment?  No  PCP : [name], [name], MD  Allergies:  No Known Allergies  Medications: Outpatient Encounter Medications as of 04/29/2020  Medication Sig  . Adalimumab (HUMIRA PEN) 40 MG/0.4ML PNKT Inject 40 mg into the skin every 14 (fourteen) days.  . Cholecalciferol (D3-1000) 25 MCG (1000 UT) capsule Take 1,000 Units by mouth daily.   . COMBIGAN 0.2-0.5 % ophthalmic solution INT 1 GTT IN OU BID  . indapamide (LOZOL) 1.25 MG tablet TK 1 T PO QD  . latanoprost (XALATAN) 0.005 % ophthalmic solution INT 1 GTT IN OS QPM B DINNER  . LOTEMAX 0.5 % GEL INT 1 GTT IN OS D  . Polyethylene Glycol 3350 (MIRALAX PO) Take by mouth as needed.  . potassium citrate (UROCIT-K) 10 MEQ (1080 MG) SR tablet 3 (three) times daily.   No facility-administered encounter medications on file as of 04/29/2020.    Current Diagnosis: Patient Active  Problem List   Diagnosis Date Noted  . Aortic aneurysm ([place])   . Aortic valve regurgitation 01/09/2019  . Nephrolithiasis 01/09/2019  . BPH (benign prostatic hyperplasia) 01/09/2019  . Overweight (BMI 25.0-29.9) 01/09/2019  . Primary osteoarthritis of both hips 12/29/2018  . Primary osteoarthritis of both knees 12/29/2018  . Raised intraocular pressure of both eyes 12/19/2018  . Ankylosing spondylitis of multiple sites in spine ([place]) 12/19/2018  . Hiatal hernia with GERD 11/08/2018  . History of colonoscopy 01/07/2018  . Gall stone in bile duct with infection of gallbladder 12/14/2017  . Hernia of abdominal cavity 12/07/2016  . Dislocated elbow, left, initial encounter 10/07/2016  . Enlarged liver 12/08/2006  . Aortic anomaly 01/07/2005  . Fracture 02/17/2004  . Keratoconus 12/07/1977    Goals Addressed   None     Follow-Up:  Pharmacist Review  Maia Breslow, [place] Assistant [phone]

## 2020-04-30 ENCOUNTER — Encounter: Payer: Self-pay | Admitting: Rheumatology

## 2020-04-30 NOTE — Telephone Encounter (Signed)
Will print and complete provider application page. Patient completed his enrollment portion online.

## 2020-05-01 NOTE — Telephone Encounter (Signed)
Provider portion filled out and pending provider signature.

## 2020-05-02 ENCOUNTER — Ambulatory Visit (INDEPENDENT_AMBULATORY_CARE_PROVIDER_SITE_OTHER): Payer: Medicare Other

## 2020-05-02 ENCOUNTER — Ambulatory Visit: Payer: Medicare Other

## 2020-05-02 ENCOUNTER — Other Ambulatory Visit: Payer: Self-pay

## 2020-05-02 DIAGNOSIS — Z23 Encounter for immunization: Secondary | ICD-10-CM

## 2020-05-05 NOTE — Telephone Encounter (Signed)
Received a fax from  [place] regarding an approval for [place] from 05/02/2020 to 08/08/2021.    Mariella Saa, PharmD, [place], CPP Clinical Specialty Pharmacist (Rheumatology and Pulmonology)  05/05/2020 9:53 AM

## 2020-05-05 NOTE — Chronic Care Management (AMB) (Signed)
Chronic Care Management Pharmacy  Name: Antonio Ayers  MRN: [id number] DOB: 06-16-1951  Initial Planning Appointment: completed 04/29/2020  Initial Questions: 1. Have you seen any other providers since your last visit? n/a 2. Any changes in your medicines or health? No   Chief Complaint/ HPI  Antonio Ayers,  69 y.o. , male presents for their Initial CCM visit with the clinical pharmacist via telephone due to COVID-19 Pandemic.  PCP : [name], [name], MD  Their chronic conditions include: Ankylosing spondylitis, glaucoma, kidney stones, GERD with hiatal hernia, constipation  Office Visits: -02/19/20 Lelon Frohlich, MD: Patient presented for constipation concern. Previous bowel movement was 5 days ago. Recommended increasing fluid consumption, add fiber supplement daily, and use colace and Miralax PRN.  -01/11/20  Lelon Frohlich, MD: Patient presented for video visit to discuss COVID vaccine and having less of a response while on Humira therapy.   Consult Visit: -04/10/20 Landis Martins, DPM (podiatry): Patient presented for toenail debridement. Debridement performed and nails trimmed. Follow up in 3 months.  -04/01/20 Bo Merino, MD (rheumatology): Patient presented for ankylosing spondylitis follow up. Patient continues on Humira 40 mg sq every 14 days. Patient denies any recent flares but does report generalized joint stiffness every morning and if sitting for prolonged periods of time. Follow up in 5 months.  -01/01/20 Landis Martins, DPM (podiatry): Patient presented for toenail debridement. Debridement performed and nails trimmed. Follow up in 3 months.  -11/13/19 Cleotilde Neer: Patient presented for hemangioma of skin follow up. Unable to access notes.  -11/05/19 Jola Schmidt (ophthalmology): Patient presented for open-angle glaucoma follow up. Unable to access notes.  Medications: Outpatient Encounter Medications as of 05/06/2020    Medication Sig  . Adalimumab (HUMIRA PEN) 40 MG/0.4ML PNKT Inject 40 mg into the skin every 14 (fourteen) days.  . Cholecalciferol (D3-1000) 25 MCG (1000 UT) capsule Take 1,000 Units by mouth daily.   . COMBIGAN 0.2-0.5 % ophthalmic solution INT 1 GTT IN OU BID  . docusate sodium (COLACE) 100 MG capsule Take 100 mg by mouth daily as needed for mild constipation.  . indapamide (LOZOL) 1.25 MG tablet TK 1 T PO QD  . latanoprost (XALATAN) 0.005 % ophthalmic solution INT 1 GTT IN OS QPM B DINNER  . LOTEMAX 0.5 % GEL INT 1 GTT IN OS D  . Polyethylene Glycol 3350 (MIRALAX PO) Take by mouth as needed.  . potassium citrate (UROCIT-K) 10 MEQ (1080 MG) SR tablet 10 mEq 3 (three) times daily.    No facility-administered encounter medications on file as of 05/06/2020.   Patient is retired and lives in a Teacher, [employer]. He was born in [place] and then moved away for school to [place] and didn't move back until about 2 years ago. He came back to help his sister and her husband. He does have some friends here but more friends back in [place]. He is thinking about moving back or moving to a house with less stairs.  Patient enjoys going for walks, weather dependent, but can't walk for a very long time due to his ankylosing spondylitis. He also has a lot of eye problems and has had a cornea transplant in the past. He is currently able to drive but he isn't sure for how much longer.  He does all the cooking and cleaning but due to his vision he may look for help soon. He doesn't eat out much but does cooking or microwaving frozen dinners (heart healthy). He reports he saw  dietician in [place] who gave him a lot of ideas for eating more healthy.  Patient doesn't sleep throughout the night as he gets up to go to the bathroom a few times during the night. He has had some sleep walking/hallucinations at night. He gets a total of 6-8 hours of sleep per night.   Current Diagnosis/Assessment:  Goals Addressed             This Visit's Progress   . Pharmacy Care Plan       CARE PLAN ENTRY (see longitudinal plan of care for additional care plan information)  Current Barriers:  . Chronic Disease Management support, education, and care coordination needs related to GERD and ankylosing spondylitis, constipation, kidney stones, glaucoma   Ankylosing spondylitis . Pharmacist Clinical Goal(s): o Over the next 180 days, patient will work with PharmD and providers to manage symptoms of ankylosing spondylitis . Current regimen:  o Humira 40 mg inject into the skin every 14 days . Interventions: o We discussed timing of medication in reference to COVID vaccine . Patient self care activities - Over the next 180 days, patient will: o Continue taking current medications  GERD with hiatal hernia . Pharmacist Clinical Goal(s): o Over the next 180 days, patient will work with PharmD and providers to manage symptoms of heartburn . Current regimen:  o No medications . Interventions: o We discussed ways to avoid symptoms of heartburn such as avoiding eating 2 hours before bedtime, elevating head of bed, and avoiding spicy/fatty foods that may trigger symptoms . Patient self care activities - Over the next 180 days, patient will: o Discuss if medicines are needed at next office visit  Glaucoma . Pharmacist Clinical Goal(s): o Over the next 180 days, patient will work with PharmD and providers to reduce eye pressure with medications . Current regimen:  . Latanoprost 0.005% 1 drop in left before bedtime . Lotemax 0.5% gel - apply to left eye . Combigan 0.2-0.5% 1 drop in both eyes twice daily  . Interventions: o We discussed patient assistance information for Combigan in order to get it free from the manufacturer . Patient self care activities - Over the next 180 days, patient will: o Continue current medications o Apply for patient assistance for Combigan and coordinate completion with eye doctor  Kidney  stones . Pharmacist Clinical Goal(s) o Over the next 180 days, patient will work with PharmD and providers to avoid developing kidney stones . Current regimen:  o Indapamide 1.25 mg 1 tablet daily . Interventions: o We discussed monitoring blood pressure while taking a diuretic  o We discussed this medicine is the reason for needing potassium supplement . Patient self care activities - Over the next 180 days, patient will: o Continue current medications o Check blood pressure at home a few times a month  Constipation . Pharmacist Clinical Goal(s) o Over the next 180 days, patient will work with PharmD and providers to manage symptoms of constipation . Current regimen:  Marland Kitchen Miralax 1 tbsp a day . Colace 100 mg 1 tablet as needed . Interventions: o We discussed increasing fiber intake with foods that are high in soluble fiber (oatmeal, green leafy vegetables, fruit). o We discussed the importance of drinking plenty of water to help prevent constipation . Patient self care activities - Over the next 180 days, patient will: o Continue current medications  Medication management . Pharmacist Clinical Goal(s): o Over the next 180 days, patient will work with PharmD and providers to maintain  optimal medication adherence . Current pharmacy: *[facility] . Interventions o Comprehensive medication review performed. o Continue current medication management strategy . Patient self care activities - Over the next 180 days, patient will: o Take medications as prescribed o Report any questions or concerns to PharmD and/or provider(s)  Initial goal documentation       Ankylosing spondylitis   Patient has failed these meds in past: none Patient is currently controlled on the following medications:  . Humira 40 mg inject into the skin every 14 days  We discussed:  Patient paused therapy due to COVID-19 vaccine booster but he has restarted it after 4 weeks off  Plan Getting Hum[facility] from  [facility]. Continue current medications   Kidney stones   Patient is currently controlled on the following medications:  . Indapamide 1.25 mg 1 tablet daily  We discussed: the importance of continued blood pressure monitoring while taking this medication -He does report some dizziness/lightheadedness and will check his BP at this time (98/60s) but otherwise it is around 110/70s.  Plan Continue current medications  Open angle glaucoma   Patient has failed these meds in past: timolol (switched to Combigan) Patient is currently controlled on the following medications:  . Latanoprost 0.005% 1 drop in left before bedtime . Lotemax 0.5% gel - apply to left eye . Combigan 0.2-0.5% 1 drop in both eyes twice daily   We discussed:  quantifications for patient assistance for Combigan (only medicine available as brand)  Plan Send patient assistance application for Combigan through [facility]. Continue current medications   GERD with hiatal hernia   Patient has failed these meds in past: omeprazole Patient is currently controlled on the following medications:  . No medications  We discussed:  -Patient does admit to some belching or gas but no other symptoms -Non pharmacologic treatment (avoid eating 2 hours before bed, elevating head of bed, avoiding spicy/fatty foods)  Plan Plan to discuss with GI. Continue current medications   Constipation   Patient is currently controlled on the following medications:  Marland Kitchen Miralax 1 tbsp a day . Colace 100 mg 1 tablet PRN  We discussed:   -Fiber: foods that are high in soluble fiber (oatmeal, green leafy vegetables, some fruit); patient reports he tries to eat vegetables when he can  -Fluids: patient does drink lots of water and encouraged him to continue to do so  Plan  Continue current medications   Miscellaneous   CMP Latest Ref Rng & Units 03/25/2020 11/02/2019 06/22/2019  Glucose 65 - 99 mg/dL 114(H) 102(H) 110(H)  BUN 8 - 27  mg/dL 12 14 13   Creatinine 0.76 - 1.27 mg/dL 0.68(L) 0.65(L) 0.69  Sodium 134 - 144 mmol/L 134 140 139  Potassium 3.5 - 5.2 mmol/L 3.8 3.9 4.2  Chloride 96 - 106 mmol/L 97 102 101  CO2 20 - 29 mmol/L 23 28 29   Calcium 8.6 - 10.2 mg/dL 9.0 9.5 9.2  Total Protein 6.0 - 8.5 g/dL 6.6 6.6 6.9  Total Bilirubin 0.0 - 1.2 mg/dL 2.7(H) 2.9(H) 3.5(H)  Alkaline Phos 48 - 121 IU/L 85 - 68  AST 0 - 40 IU/L 16 21 17   ALT 0 - 44 IU/L 18 23 19     Last vitamin D Lab Results  Component Value Date   VD25OH 41.37 06/22/2019   Patient is currently controlled on the following medications:  Marland Kitchen Vitamin D 1000 unit 1 capsule daily  . Potassium 10 mEq 1 tablet three times daily   We discussed: he  is taking the potassium due to using indapamide  Plan  Continue current medications   Vaccines   Reviewed and discussed patient's vaccination history.    Immunization History  Administered Date(s) Administered  . Fluad Quad(high Dose 65+) 04/30/2019, 05/02/2020  . PFIZER SARS-COV-2 Vaccination 09/01/2019, 09/22/2019, 03/30/2020  . Pneumococcal Conjugate-13 02/22/2019  . Tdap 06/25/2019  . Zoster Recombinat (Shingrix) 02/22/2019, 04/30/2019    Plan Recommended patient receive Pneumovax 23 vaccine in office/at pharmacy.   Medication Management   Pt uses [facility] pharmacy for all medications Uses pill box? No - only has 3 medicines Pt endorses 100% compliance  We discussed: Current pharmacy is preferred with insurance plan and patient is satisfied with pharmacy services  Plan  Continue current medication management strategy   Follow up: 6 month phone visit   [name], PharmD Clinical Pharmacist [place] [facility] [place] [phone]

## 2020-05-06 ENCOUNTER — Ambulatory Visit: Payer: Medicare Other

## 2020-05-06 ENCOUNTER — Telehealth: Payer: Self-pay | Admitting: Internal Medicine

## 2020-05-06 DIAGNOSIS — K449 Diaphragmatic hernia without obstruction or gangrene: Secondary | ICD-10-CM

## 2020-05-06 DIAGNOSIS — M450 Ankylosing spondylitis of multiple sites in spine: Secondary | ICD-10-CM

## 2020-05-06 NOTE — Telephone Encounter (Signed)
pt would like to have the pneumonia shot

## 2020-05-07 ENCOUNTER — Ambulatory Visit (INDEPENDENT_AMBULATORY_CARE_PROVIDER_SITE_OTHER): Payer: Medicare Other | Admitting: Gastroenterology

## 2020-05-07 ENCOUNTER — Encounter: Payer: Self-pay | Admitting: Gastroenterology

## 2020-05-07 VITALS — BP 118/62 | HR 86 | Ht 73.0 in | Wt 218.0 lb

## 2020-05-07 DIAGNOSIS — R17 Unspecified jaundice: Secondary | ICD-10-CM

## 2020-05-07 DIAGNOSIS — Z86010 Personal history of colonic polyps: Secondary | ICD-10-CM | POA: Diagnosis not present

## 2020-05-07 DIAGNOSIS — K2100 Gastro-esophageal reflux disease with esophagitis, without bleeding: Secondary | ICD-10-CM | POA: Diagnosis not present

## 2020-05-07 DIAGNOSIS — K5909 Other constipation: Secondary | ICD-10-CM

## 2020-05-07 DIAGNOSIS — K760 Fatty (change of) liver, not elsewhere classified: Secondary | ICD-10-CM | POA: Diagnosis not present

## 2020-05-07 NOTE — Patient Instructions (Signed)
If you are age 71 or older, your body mass index should be between 23-30. Your Body mass index is 28.76 kg/m. If this is out of the aforementioned range listed, please consider follow up with your Primary Care Provider.  If you are age 14 or younger, your body mass index should be between 19-25. Your Body mass index is 28.76 kg/m. If this is out of the aformentioned range listed, please consider follow up with your Primary Care Provider.   You will be due for a recall colonoscopy in July 2023. We will send you a reminder in the mail when it gets closer to that time.   It was a pleasure to see you today!  Dr. Loletha Carrow

## 2020-05-07 NOTE — Progress Notes (Signed)
[facility] Consult Note:  History: Antonio Ayers 05/07/2020  Referring provider: [name], [name], MD  Reason for consult/chief complaint: Colonoscopy (discuss scheduling -last colon 2016), Abdominal Pain (chronic constipation- takes Miralax and colace as needed. ), Gastroesophageal Reflux (not taking any meds- has tried OTC prilosec and Zantac ), and Gas (occ gas )   Subjective  HPI:  This is a very pleasant 69 year old man who is referred to establish care for chronic digestive symptoms.  Extensive records reviewed that the patient brought from previous care with the GI division of [facility] in [place]. He was seen by them in 2016 for chronic constipation with bloating gas, pellet-like stools and incomplete evacuation.  There is also erosive esophagitis and a hiatal hernia with subsequent fundoplication in December 2016.  After that he was having gas and bloating.  IBgard, align, Gas-X all ineffective for gas and bloating. Had tried MiraLAX with some improvement. Last colonoscopy report from 02/24/2015 indicates 2 diminutive hepatic flexure polyps removed (good prep) (subsequent office note indicates the polyps were tubular adenomas) Upper endoscopy at the same time with a large hiatal hernia and reflux esophagitis There were additional records related to his fundoplication surgery. (Approximately 50 pages of records)  Coralyn Mark reports intermittent symptoms of heartburn that he treats with as needed use of Prilosec or H2 blocker.  He denies dysphagia unless he drinks a glass of water very quickly.  He tends toward intermittent bloating and belching.  He also has irregular bowel habits, more so constipation and diarrhea.  It seems stable on as needed use of MiraLAX and stool softener.  He mostly wanted to talk about previous colonoscopy and polyp results with need for follow-up exam.  ROS:  Review of Systems  Constitutional: Negative for  appetite change and unexpected weight change.  HENT: Negative for mouth sores and voice change.   Eyes: Negative for pain and redness.  Respiratory: Negative for cough and shortness of breath.   Cardiovascular: Negative for chest pain and palpitations.  Genitourinary: Negative for dysuria and hematuria.  Musculoskeletal: Positive for back pain. Negative for arthralgias and myalgias.  Skin: Negative for pallor and rash.  Neurological: Negative for weakness and headaches.  Hematological: Negative for adenopathy.   Chronic neck hip and back pain from ankylosing spondylitis  Past Medical History: Past Medical History:  Diagnosis Date  . Abdominal hernia   . Ankylosing spondylitis ([facility])   . Aortic aneurysm ([facility])    mildly dilated ascending aorta at 10mm and aortic root 31mm by echo 03/2020  . Aortic insufficiency   . Chronic diarrhea   . Colon polyp   . GERD (gastroesophageal reflux disease)   . Nephrolithiasis      Past Surgical History: Past Surgical History:  Procedure Laterality Date  . CATARACT EXTRACTION Left 2016  . CHOLECYSTECTOMY  01/2018  . COLONOSCOPY     02/2015  . [place], 2014  . HERNIA REPAIR  2018  . HIATAL HERNIA REPAIR    . KIDNEY STONE SURGERY  03/2018  . PROSTATE BIOPSY     x3  . UPPER GASTROINTESTINAL ENDOSCOPY     He describes an ERCP around May 2019 for bile duct stone while living in [place]. Cholecystectomy occurred shortly afterward.  Family History: Family History  Problem Relation Age of Onset  . Stroke Mother   . Stroke Father   . Heart failure Father   . Diabetes Sister   . Obesity Sister   . Hiatal  hernia Sister     Social History: Social History   Socioeconomic History  . Marital status: Single    Spouse name: Not on file  . Number of children: Not on file  . Years of education: Not on file  . Highest education level: Not on file  Occupational History  . Occupation: retired   Tobacco Use  . Smoking  status: Never Smoker  . Smokeless tobacco: Never Used  Vaping Use  . Vaping Use: Never used  Substance and Sexual Activity  . Alcohol use: Yes    Comment: occ  . Drug use: Not Currently  . Sexual activity: Not on file  Other Topics Concern  . Not on file  Social History Narrative  . Not on file   Social Determinants of Health   Financial Resource Strain:   . Difficulty of Paying Living Expenses: Not on file  Food Insecurity:   . Worried About Charity fundraiser in the Last Year: Not on file  . Ran Out of Food in the Last Year: Not on file  Transportation Needs:   . Lack of Transportation (Medical): Not on file  . Lack of Transportation (Non-Medical): Not on file  Physical Activity:   . Days of Exercise per Week: Not on file  . Minutes of Exercise per Session: Not on file  Stress:   . Feeling of Stress : Not on file  Social Connections:   . Frequency of Communication with Friends and Family: Not on file  . Frequency of Social Gatherings with Friends and Family: Not on file  . Attends Religious Services: Not on file  . Active Member of Clubs or Organizations: Not on file  . Attends Archivist Meetings: Not on file  . Marital Status: Not on file    Allergies: No Known Allergies  Outpatient Meds: Current Outpatient Medications  Medication Sig Dispense Refill  . Adalimumab (HUMIRA PEN) 40 MG/0.4ML PNKT Inject 40 mg into the skin every 14 (fourteen) days. 3 each 0  . Cholecalciferol (D3-1000) 25 MCG (1000 UT) capsule Take 1,000 Units by mouth daily.     . COMBIGAN 0.2-0.5 % ophthalmic solution INT 1 GTT IN OU BID    . docusate sodium (COLACE) 100 MG capsule Take 100 mg by mouth daily as needed for mild constipation.    . indapamide (LOZOL) 1.25 MG tablet TK 1 T PO QD    . latanoprost (XALATAN) 0.005 % ophthalmic solution INT 1 GTT IN OS QPM B DINNER    . LOTEMAX 0.5 % GEL INT 1 GTT IN OS D    . Polyethylene Glycol 3350 (MIRALAX PO) Take by mouth as needed.      . potassium citrate (UROCIT-K) 10 MEQ (1080 MG) SR tablet 10 mEq 3 (three) times daily.      No current facility-administered medications for this visit.      ___________________________________________________________________ Objective   Exam:  BP 118/62 (BP Location: Left Arm, Patient Position: Sitting, Cuff Size: Large)   Pulse 86   Ht 6\' 1"  (1.854 m)   Wt 218 lb (98.9 kg)   SpO2 95%   BMI 28.76 kg/m    General: Pleasant and conversational, antalgic gait.  Able to get on exam table slowly but without assistance  Eyes: sclera anicteric, no redness  ENT: oral mucosa moist without lesions, no cervical or supraclavicular lymphadenopathy  CV: RRR without murmur, S1/S2, no JVD, no peripheral edema  Resp: clear to auscultation bilaterally, normal RR and effort  noted  GI: soft, no tenderness, with active bowel sounds. No guarding or palpable organomegaly noted.  Skin; warm and dry, no rash or jaundice noted  Neuro: awake, alert and oriented x 3. Normal gross motor function and fluent speech  Labs:  CBC Latest Ref Rng & Units 03/25/2020 11/02/2019 06/22/2019  WBC 3.4 - 10.8 x10E3/uL 7.1 6.9 7.9  Hemoglobin 13.0 - 17.7 g/dL 16.0 16.0 16.3  Hematocrit 37.5 - 51.0 % 43.4 45.4 45.6  Platelets 150 - 450 x10E3/uL 202 191 193.0   CMP Latest Ref Rng & Units 03/25/2020 11/02/2019 06/22/2019  Glucose 65 - 99 mg/dL 114(H) 102(H) 110(H)  BUN 8 - 27 mg/dL 12 14 13   Creatinine 0.76 - 1.27 mg/dL 0.68(L) 0.65(L) 0.69  Sodium 134 - 144 mmol/L 134 140 139  Potassium 3.5 - 5.2 mmol/L 3.8 3.9 4.2  Chloride 96 - 106 mmol/L 97 102 101  CO2 20 - 29 mmol/L 23 28 29   Calcium 8.6 - 10.2 mg/dL 9.0 9.5 9.2  Total Protein 6.0 - 8.5 g/dL 6.6 6.6 6.9  Total Bilirubin 0.0 - 1.2 mg/dL 2.7(H) 2.9(H) 3.5(H)  Alkaline Phos 48 - 121 IU/L 85 - 68  AST 0 - 40 IU/L 16 21 17   ALT 0 - 44 IU/L 18 23 19      Radiologic Studies:  Ultrasound with elastography October 2015 indicated increased liver  echogenicity with smooth liver edge, normal portal flow, incidental gallstones  At that time liver labs were normal except bilirubin 1.6 Assessment: Encounter Diagnoses  Name Primary?  . Gastroesophageal reflux disease with esophagitis without hemorrhage Yes  . Personal history of colonic polyps   . Chronic constipation   . Fatty liver   . Elevated bilirubin     Chronic stable GERD symptoms, status post fundoplication.  No specific endoscopic or radiographic surveillance needed.  Follow symptoms and further testing as warranted, particularly if develops dysphagia. Metabolic fatty liver disease with previous work-up and low fibrosis scale. His elevated bilirubin is most likely Gilbert's  Based on current updated guidelines, next surveillance colonoscopy had a 7-year interval.  Plan:  Recall colonoscopy about July 2023. Continue current treatment and see me as needed in the meantime.  Thank you for the courtesy of this consult.  Please call me with any questions or concerns.  Nelida Meuse III  CC: Referring provider noted above

## 2020-05-08 NOTE — Telephone Encounter (Signed)
Patient is aware that it is too soon for a pneumonia vaccine

## 2020-05-08 NOTE — Patient Instructions (Addendum)
Hello Antonio Ayers!  It was so lovely to get to speak with you over the phone this week. I admire your good attitude and hope to encourage you to keep doing a good job with taking care of yourself. As discussed, it is important to check your blood pressure periodically while taking a medicine that can lower your blood pressure (indapamide) and to reach out to get your Pneumonia vaccine (pneumovax 23). As promised, here is the link for the application for getting the Combigan eye drops free from the manufacturer:  PaidProducts.be.pdf   Please call me if you have questions as you are filling it out or any questions about what we discussed. I couldn't get it to attach so I hope sending the link will work, but let me know if it doesn't.  Hope this helps and I look forward to our future touch bases!  Best, [name]  [name], PharmD Clinical Pharmacist [facility]     Visit Information  Goals Addressed            This Visit's Progress   . Pharmacy Care Plan       CARE PLAN ENTRY (see longitudinal plan of care for additional care plan information)  Current Barriers:  . Chronic Disease Management support, education, and care coordination needs related to GERD and ankylosing spondylitis, constipation, kidney stones, glaucoma   Ankylosing spondylitis . Pharmacist Clinical Goal(s): o Over the next 180 days, patient will work with PharmD and providers to manage symptoms of ankylosing spondylitis . Current regimen:  o Humira 40 mg inject into the skin every 14 days . Interventions: o We discussed timing of medication in reference to COVID vaccine . Patient self care activities - Over the next 180 days, patient will: o Continue taking current medications  GERD with hiatal hernia . Pharmacist Clinical Goal(s): o Over the next 180 days, patient will work with  PharmD and providers to manage symptoms of heartburn . Current regimen:  o No medications . Interventions: o We discussed ways to avoid symptoms of heartburn such as avoiding eating 2 hours before bedtime, elevating head of bed, and avoiding spicy/fatty foods that may trigger symptoms . Patient self care activities - Over the next 180 days, patient will: o Discuss if medicines are needed at next office visit  Glaucoma . Pharmacist Clinical Goal(s): o Over the next 180 days, patient will work with PharmD and providers to reduce eye pressure with medications . Current regimen:  . Latanoprost 0.005% 1 drop in left before bedtime . Lotemax 0.5% gel - apply to left eye . Combigan 0.2-0.5% 1 drop in both eyes twice daily  . Interventions: o We discussed patient assistance information for Combigan in order to get it free from the manufacturer . Patient self care activities - Over the next 180 days, patient will: o Continue current medications o Apply for patient assistance for Combigan and coordinate completion with eye doctor  Kidney stones . Pharmacist Clinical Goal(s) o Over the next 180 days, patient will work with PharmD and providers to avoid developing kidney stones . Current regimen:  o Indapamide 1.25 mg 1 tablet daily . Interventions: o We discussed monitoring blood pressure while taking a diuretic  o We discussed this medicine is the reason for needing potassium supplement . Patient self care activities - Over the next 180 days, patient will: o Continue current medications o Check blood pressure at home a few times a month  Constipation . Pharmacist Clinical Goal(s) o  Over the next 180 days, patient will work with PharmD and providers to manage symptoms of constipation . Current regimen:  Marland Kitchen Miralax 1 tbsp a day . Colace 100 mg 1 tablet as needed . Interventions: o We discussed increasing fiber intake with foods that are high in soluble fiber (oatmeal, green leafy  vegetables, fruit). o We discussed the importance of drinking plenty of water to help prevent constipation . Patient self care activities - Over the next 180 days, patient will: o Continue current medications  Medication management . Pharmacist Clinical Goal(s): o Over the next 180 days, patient will work with PharmD and providers to maintain optimal medication adherence . Current pharmacy: *[facility] . Interventions o Comprehensive medication review performed. o Continue current medication management strategy . Patient self care activities - Over the next 180 days, patient will: o Take medications as prescribed o Report any questions or concerns to PharmD and/or provider(s)  Initial goal documentation        [name] was given information about Chronic Care Management services today including:  1. CCM service includes personalized support from designated clinical staff supervised by his physician, including individualized plan of care and coordination with other care providers 2. 24/7 contact phone numbers for assistance for urgent and routine care needs. 3. Standard insurance, coinsurance, copays and deductibles apply for chronic care management only during months in which we provide at least 20 minutes of these services. Most insurances cover these services at 100%, however patients may be responsible for any copay, coinsurance and/or deductible if applicable. This service may help you avoid the need for more expensive face-to-face services. 4. Only one practitioner may furnish and bill the service in a calendar month. 5. The patient may stop CCM services at any time (effective at the end of the month) by phone call to the office staff.  Patient agreed to services and verbal consent obtained.   The patient verbalized understanding of instructions provided today and agreed to receive a mailed copy of patient instruction and/or educational materials. Telephone follow up  appointment with pharmacy team member scheduled for: 6 months    Constipation, Adult Constipation is when a person has fewer bowel movements in a week than normal, has difficulty having a bowel movement, or has stools that are dry, hard, or larger than normal. Constipation may be caused by an underlying condition. It may become worse with age if a person takes certain medicines and does not take in enough fluids. Follow these instructions at home: Eating and drinking   Eat foods that have a lot of fiber, such as fresh fruits and vegetables, whole grains, and beans.  Limit foods that are high in fat, low in fiber, or overly processed, such as french fries, hamburgers, cookies, candies, and soda.  Drink enough fluid to keep your urine clear or pale yellow. General instructions  Exercise regularly or as told by your health care provider.  Go to the restroom when you have the urge to go. Do not hold it in.  Take over-the-counter and prescription medicines only as told by your health care provider. These include any fiber supplements.  Practice pelvic floor retraining exercises, such as deep breathing while relaxing the lower abdomen and pelvic floor relaxation during bowel movements.  Watch your condition for any changes.  Keep all follow-up visits as told by your health care provider. This is important. Contact a health care provider if:  You have pain that gets worse.  You have a fever.  You do not have a bowel movement after 4 days.  You vomit.  You are not hungry.  You lose weight.  You are bleeding from the anus.  You have thin, pencil-like stools. Get help right away if:  You have a fever and your symptoms suddenly get worse.  You leak stool or have blood in your stool.  Your abdomen is bloated.  You have severe pain in your abdomen.  You feel dizzy or you faint. This information is not intended to replace advice given to you by your health care provider.  Make sure you discuss any questions you have with your health care provider. Document Revised: 07/08/2017 Document Reviewed: 01/14/2016 Elsevier Patient Education  2020 [employer] application

## 2020-05-12 ENCOUNTER — Encounter: Payer: Self-pay | Admitting: Internal Medicine

## 2020-05-12 ENCOUNTER — Encounter: Payer: Self-pay | Admitting: Rheumatology

## 2020-06-24 ENCOUNTER — Other Ambulatory Visit: Payer: Self-pay

## 2020-06-24 ENCOUNTER — Ambulatory Visit (INDEPENDENT_AMBULATORY_CARE_PROVIDER_SITE_OTHER): Payer: Medicare Other | Admitting: Internal Medicine

## 2020-06-24 ENCOUNTER — Encounter: Payer: Self-pay | Admitting: Internal Medicine

## 2020-06-24 VITALS — BP 110/70 | HR 82 | Temp 98.3°F | Ht 73.0 in | Wt 227.2 lb

## 2020-06-24 DIAGNOSIS — M450 Ankylosing spondylitis of multiple sites in spine: Secondary | ICD-10-CM | POA: Diagnosis not present

## 2020-06-24 DIAGNOSIS — I712 Thoracic aortic aneurysm, without rupture, unspecified: Secondary | ICD-10-CM

## 2020-06-24 DIAGNOSIS — Z23 Encounter for immunization: Secondary | ICD-10-CM | POA: Diagnosis not present

## 2020-06-24 DIAGNOSIS — Z0000 Encounter for general adult medical examination without abnormal findings: Secondary | ICD-10-CM | POA: Diagnosis not present

## 2020-06-24 DIAGNOSIS — H40053 Ocular hypertension, bilateral: Secondary | ICD-10-CM

## 2020-06-24 DIAGNOSIS — N400 Enlarged prostate without lower urinary tract symptoms: Secondary | ICD-10-CM | POA: Diagnosis not present

## 2020-06-24 NOTE — Addendum Note (Signed)
Addended by: Westley Hummer B on: 06/24/2020 04:04 PM   Modules accepted: Orders

## 2020-06-24 NOTE — Progress Notes (Signed)
Established Patient Office Visit     This visit occurred during the SARS-CoV-2 public health emergency.  Safety protocols were in place, including screening questions prior to the visit, additional usage of staff PPE, and extensive cleaning of exam room while observing appropriate contact time as indicated for disinfecting solutions.    CC/Reason for Visit: Annual preventive exam and subsequent Medicare wellness visit  HPI: Antonio Ayers is a 69 y.o. male who is coming in today for the above mentioned reasons. Past Medical History is significant for: Ankylosing spondylitis followed by rheumatology currently on Humira every other week, BPH followed by urology, history of glaucoma and some deteriorating vision followed by ophthalmology, he has had bilateral corneal transplants, he also has a history of a thoracic aortic aneurysm followed by cardiology with yearly CT scans.  He has a prior history of cholelithiasis and choledocholithiasis and chronic constipation with prior hiatal hernia surgery repair.  He just established care with cardiology.  He has no acute complaints other than concerns with deteriorating vision.  He has routine eye and dental care.  He is due for his pneumonia vaccination.   Past Medical/Surgical History: Past Medical History:  Diagnosis Date  . Abdominal hernia   . Ankylosing spondylitis ([place])   . Aortic aneurysm (HCC)    mildly dilated ascending aorta at 60m and aortic root 316mby echo 03/2020  . Aortic insufficiency   . Chronic diarrhea   . Colon polyp   . GERD (gastroesophageal reflux disease)   . Nephrolithiasis     Past Surgical History:  Procedure Laterality Date  . CATARACT EXTRACTION Left 2016  . CHOLECYSTECTOMY  01/2018  . COLONOSCOPY     02/2015  . [id number]  . HERNIA REPAIR  2018  . HIATAL HERNIA REPAIR    . KIDNEY STONE SURGERY  03/2018  . PROSTATE BIOPSY     x3  . UPPER GASTROINTESTINAL ENDOSCOPY       Social History:  reports that he has never smoked. He has never used smokeless tobacco. He reports current alcohol use. He reports previous drug use.  Allergies: No Known Allergies  Family History:  Family History  Problem Relation Age of Onset  . Stroke Mother   . Stroke Father   . Heart failure Father   . Diabetes Sister   . Obesity Sister   . Hiatal hernia Sister      Current Outpatient Medications:  .  Adalimumab (HUMIRA PEN) 40 MG/0.4ML PNKT, Inject 40 mg into the skin every 14 (fourteen) days., Disp: 3 each, Rfl: 0 .  Cholecalciferol (D3-1000) 25 MCG (1000 UT) capsule, Take 1,000 Units by mouth daily. , Disp: , Rfl:  .  COMBIGAN 0.2-0.5 % ophthalmic solution, INT 1 GTT IN OU BID, Disp: , Rfl:  .  docusate sodium (COLACE) 100 MG capsule, Take 100 mg by mouth daily as needed for mild constipation., Disp: , Rfl:  .  indapamide (LOZOL) 1.25 MG tablet, TK 1 T PO QD, Disp: , Rfl:  .  latanoprost (XALATAN) 0.005 % ophthalmic solution, INT 1 GTT IN OS QPM B DINNER, Disp: , Rfl:  .  LOTEMAX 0.5 % GEL, INT 1 GTT IN OS D, Disp: , Rfl:  .  Polyethylene Glycol 3350 (MIRALAX PO), Take by mouth as needed., Disp: , Rfl:  .  potassium citrate (UROCIT-K) 10 MEQ (1080 MG) SR tablet, 10 mEq 3 (three) times daily. , Disp: , Rfl:   Review of Systems:  Constitutional: Denies fever, chills, diaphoresis, appetite change and fatigue.  HEENT: Denies photophobia, eye pain, redness, hearing loss, ear pain, congestion, sore throat, rhinorrhea, sneezing, mouth sores, trouble swallowing, neck pain, neck stiffness and tinnitus.   Respiratory: Denies SOB, DOE, cough, chest tightness,  and wheezing.   Cardiovascular: Denies chest pain, palpitations and leg swelling.  Gastrointestinal: Denies nausea, vomiting, abdominal pain, diarrhea, constipation, blood in stool and abdominal distention.  Genitourinary: Denies dysuria, urgency, frequency, hematuria, flank pain and difficulty urinating.  Endocrine:  Denies: hot or cold intolerance, sweats, changes in hair or nails, polyuria, polydipsia. Musculoskeletal: Denies myalgias, back pain, joint swelling, arthralgias and gait problem.  Skin: Denies pallor, rash and wound.  Neurological: Denies dizziness, seizures, syncope, weakness, light-headedness, numbness and headaches.  Hematological: Denies adenopathy. Easy bruising, personal or family bleeding history  Psychiatric/Behavioral: Denies suicidal ideation, mood changes, confusion, nervousness, sleep disturbance and agitation    Physical Exam: Vitals:   06/24/20 1025  BP: 110/70  Pulse: 82  Temp: 98.3 F (36.8 C)  TempSrc: Oral  SpO2: 94%  Weight: 227 lb 3.2 oz (103.1 kg)  Height: 6' 1" (1.854 m)    Body mass index is 29.98 kg/m.   Constitutional: NAD, calm, comfortable Eyes: PERRL, lids and conjunctivae normal, wears corrective lenses ENMT: Mucous membranes are moist. Posterior pharynx clear of any exudate or lesions. Normal dentition. Tympanic membrane is pearly white, no erythema or bulging. Neck: normal, supple, no masses, no thyromegaly Respiratory: clear to auscultation bilaterally, no wheezing, no crackles. Normal respiratory effort. No accessory muscle use.  Cardiovascular: Regular rate and rhythm, no murmurs / rubs / gallops. No extremity edema. 2+ pedal pulses. Abdomen: no tenderness, no masses palpated. No hepatosplenomegaly. Bowel sounds positive.  Musculoskeletal: no clubbing / cyanosis. No joint deformity upper and lower extremities. Good ROM, no contractures. Normal muscle tone.  Skin: no rashes, lesions, ulcers. No induration Neurologic: CN 2-12 grossly intact. Sensation intact, DTR normal. Strength 5/5 in all 4.  Psychiatric: Normal judgment and insight. Alert and oriented x 3. Normal mood.    Subsequent Medicare wellness visit   1. Risk factors, based on past  M,S,F -cardiovascular disease risk factors include age, gender.   2.  Physical activities: He is  very sedentary   3.  Depression/mood:  Stable, not depressed   4.  Hearing:  Minor issues   5.  ADL's: Independent in all ADLs   6.  Fall risk:  Moderate fall risk due to his history of ankylosing spondylitis.   7.  Home safety: No problems identified   8.  Height weight, and visual acuity: height and weight as above, vision:   Visual Acuity Screening   Right eye Left eye Both eyes  Without correction:     With correction: 20/50 na na     9.  Counseling:  He will continue to work on lifestyle modifications   10. Lab orders based on risk factors: Laboratory update will be reviewed   11. Referral :  None today   12. Care plan:  Follow-up with me in 6 to 12 months   13. Cognitive assessment:  No cognitive impairment   14. Screening: Patient provided with a written and personalized 5-10 year screening schedule in the AVS.   yes   15. Provider List Update:   PCP, cardiology, GI, ophthalmology, rheumatology, urology  16. Advance Directives: Full code     Office Visit from 06/24/2020 in [facility]  PHQ-9 Total Score 3        Fall Risk  06/24/2020 06/22/2019 01/09/2019  Falls in the past year? 1 0 0  Number falls in past yr: 0 0 0  Injury with Fall? 1 0 0     Impression and Plan:  Encounter for preventive health examination -He has routine eye and dental care. -PPSV23 due today, otherwise immunizations are up-to-date and age-appropriate. -No need for labs today. -Healthy lifestyle discussed in detail. -His next colonoscopy will be due in 2023.  Thoracic aortic aneurysm without rupture (HCC) -Followed by cardiology with yearly CT scans.  Benign prostatic hyperplasia without lower urinary tract symptoms -Follows with urology semiannually.  Ankylosing spondylitis of multiple sites in spine (HCC) -Followed by rheumatology on every other week Humira.  Raised intraocular pressure of both eyes -Followed by ophthalmology  Need for vaccination  against Streptococcus pneumoniae -PPSV23 administered today.   Patient Instructions  -Nice seeing you today!!  -Lab work today; will notify you once results are available.  -Pneumonia vaccine today.  -Schedule follow up in 1 year or sooner as needed.   Preventive Care 65 Years and Older, Male Preventive care refers to lifestyle choices and visits with your health care provider that can promote health and wellness. This includes:  A yearly physical exam. This is also called an annual well check.  Regular dental and eye exams.  Immunizations.  Screening for certain conditions.  Healthy lifestyle choices, such as diet and exercise. What can I expect for my preventive care visit? Physical exam Your health care provider will check:  Height and weight. These may be used to calculate body mass index (BMI), which is a measurement that tells if you are at a healthy weight.  Heart rate and blood pressure.  Your skin for abnormal spots. Counseling Your health care provider may ask you questions about:  Alcohol, tobacco, and drug use.  Emotional well-being.  Home and relationship well-being.  Sexual activity.  Eating habits.  History of falls.  Memory and ability to understand (cognition).  Work and work environment. What immunizations do I need?  Influenza (flu) vaccine  This is recommended every year. Tetanus, diphtheria, and pertussis (Tdap) vaccine  You may need a Td booster every 10 years. Varicella (chickenpox) vaccine  You may need this vaccine if you have not already been vaccinated. Zoster (shingles) vaccine  You may need this after age 60. Pneumococcal conjugate (PCV13) vaccine  One dose is recommended after age 65. Pneumococcal polysaccharide (PPSV23) vaccine  One dose is recommended after age 65. Measles, mumps, and rubella (MMR) vaccine  You may need at least one dose of MMR if you were born in 1957 or later. You may also need a second  dose. Meningococcal conjugate (MenACWY) vaccine  You may need this if you have certain conditions. Hepatitis A vaccine  You may need this if you have certain conditions or if you travel or work in places where you may be exposed to hepatitis A. Hepatitis B vaccine  You may need this if you have certain conditions or if you travel or work in places where you may be exposed to hepatitis B. Haemophilus influenzae type b (Hib) vaccine  You may need this if you have certain conditions. You may receive vaccines as individual doses or as more than one vaccine together in one shot (combination vaccines). Talk with your health care provider about the risks and benefits of combination vaccines. What tests do I need? Blood tests  Lipid and cholesterol levels. These may be checked every 5 years, or   more frequently depending on your overall health.  Hepatitis C test.  Hepatitis B test. Screening  Lung cancer screening. You may have this screening every year starting at age 55 if you have a 30-pack-year history of smoking and currently smoke or have quit within the past 15 years.  Colorectal cancer screening. All adults should have this screening starting at age 50 and continuing until age 75. Your health care provider may recommend screening at age 70 if you are at increased risk. You will have tests every 1-10 years, depending on your results and the type of screening test.  Prostate cancer screening. Recommendations will vary depending on your family history and other risks.  Diabetes screening. This is done by checking your blood sugar (glucose) after you have not eaten for a while (fasting). You may have this done every 1-3 years.  Abdominal aortic aneurysm (AAA) screening. You may need this if you are a current or former smoker.  Sexually transmitted disease (STD) testing. Follow these instructions at home: Eating and drinking  Eat a diet that includes fresh fruits and vegetables, whole  grains, lean protein, and low-fat dairy products. Limit your intake of foods with high amounts of sugar, saturated fats, and salt.  Take vitamin and mineral supplements as recommended by your health care provider.  Do not drink alcohol if your health care provider tells you not to drink.  If you drink alcohol: ? Limit how much you have to 0-2 drinks a day. ? Be aware of how much alcohol is in your drink. In the [place] one drink equals one 12 oz bottle of beer (355 mL), one 5 oz glass of wine (148 mL), or one 1 oz glass of hard liquor (44 mL). Lifestyle  Take daily care of your teeth and gums.  Stay active. Exercise for at least 30 minutes on 5 or more days each week.  Do not use any products that contain nicotine or tobacco, such as cigarettes, e-cigarettes, and chewing tobacco. If you need help quitting, ask your health care provider.  If you are sexually active, practice safe sex. Use a condom or other form of protection to prevent STIs (sexually transmitted infections).  Talk with your health care provider about taking a low-dose aspirin or statin. What's next?  Visit your health care provider once a year for a well check visit.  Ask your health care provider how often you should have your eyes and teeth checked.  Stay up to date on all vaccines. This information is not intended to replace advice given to you by your health care provider. Make sure you discuss any questions you have with your health care provider. Document Revised: 07/20/2018 Document Reviewed: 07/20/2018 Elsevier Patient Education  2020 Elsevier Inc.       Hernandez Acosta, MD [facility]   

## 2020-06-24 NOTE — Patient Instructions (Signed)
-Nice seeing you today!!  -Lab work today; will notify you once results are available.  -Pneumonia vaccine today.  -Schedule follow up in 1 year or sooner as needed.   Preventive Care 70 Years and Older, Male Preventive care refers to lifestyle choices and visits with your health care provider that can promote health and wellness. This includes:  A yearly physical exam. This is also called an annual well check.  Regular dental and eye exams.  Immunizations.  Screening for certain conditions.  Healthy lifestyle choices, such as diet and exercise. What can I expect for my preventive care visit? Physical exam Your health care provider will check:  Height and weight. These may be used to calculate body mass index (BMI), which is a measurement that tells if you are at a healthy weight.  Heart rate and blood pressure.  Your skin for abnormal spots. Counseling Your health care provider may ask you questions about:  Alcohol, tobacco, and drug use.  Emotional well-being.  Home and relationship well-being.  Sexual activity.  Eating habits.  History of falls.  Memory and ability to understand (cognition).  Work and work Statistician. What immunizations do I need?  Influenza (flu) vaccine  This is recommended every year. Tetanus, diphtheria, and pertussis (Tdap) vaccine  You may need a Td booster every 10 years. Varicella (chickenpox) vaccine  You may need this vaccine if you have not already been vaccinated. Zoster (shingles) vaccine  You may need this after age 70. Pneumococcal conjugate (PCV13) vaccine  One dose is recommended after age 60. Pneumococcal polysaccharide (PPSV23) vaccine  One dose is recommended after age 21. Measles, mumps, and rubella (MMR) vaccine  You may need at least one dose of MMR if you were born in 1957 or later. You may also need a second dose. Meningococcal conjugate (MenACWY) vaccine  You may need this if you have certain  conditions. Hepatitis A vaccine  You may need this if you have certain conditions or if you travel or work in places where you may be exposed to hepatitis A. Hepatitis B vaccine  You may need this if you have certain conditions or if you travel or work in places where you may be exposed to hepatitis B. Haemophilus influenzae type b (Hib) vaccine  You may need this if you have certain conditions. You may receive vaccines as individual doses or as more than one vaccine together in one shot (combination vaccines). Talk with your health care provider about the risks and benefits of combination vaccines. What tests do I need? Blood tests  Lipid and cholesterol levels. These may be checked every 5 years, or more frequently depending on your overall health.  Hepatitis C test.  Hepatitis B test. Screening  Lung cancer screening. You may have this screening every year starting at age 70 if you have a 30-pack-year history of smoking and currently smoke or have quit within the past 15 years.  Colorectal cancer screening. All adults should have this screening starting at age 70 and continuing until age 70. Your health care provider may recommend screening at age 2 if you are at increased risk. You will have tests every 1-10 years, depending on your results and the type of screening test.  Prostate cancer screening. Recommendations will vary depending on your family history and other risks.  Diabetes screening. This is done by checking your blood sugar (glucose) after you have not eaten for a while (fasting). You may have this done every 1-3 years.  Abdominal  aortic aneurysm (AAA) screening. You may need this if you are a current or former smoker.  Sexually transmitted disease (STD) testing. Follow these instructions at home: Eating and drinking  Eat a diet that includes fresh fruits and vegetables, whole grains, lean protein, and low-fat dairy products. Limit your intake of foods with high  amounts of sugar, saturated fats, and salt.  Take vitamin and mineral supplements as recommended by your health care provider.  Do not drink alcohol if your health care provider tells you not to drink.  If you drink alcohol: ? Limit how much you have to 0-2 drinks a day. ? Be aware of how much alcohol is in your drink. In the [place] one drink equals one 12 oz bottle of beer (355 mL), one 5 oz glass of wine (148 mL), or one 1 oz glass of hard liquor (44 mL). Lifestyle  Take daily care of your teeth and gums.  Stay active. Exercise for at least 30 minutes on 5 or more days each week.  Do not use any products that contain nicotine or tobacco, such as cigarettes, e-cigarettes, and chewing tobacco. If you need help quitting, ask your health care provider.  If you are sexually active, practice safe sex. Use a condom or other form of protection to prevent STIs (sexually transmitted infections).  Talk with your health care provider about taking a low-dose aspirin or statin. What's next?  Visit your health care provider once a year for a well check visit.  Ask your health care provider how often you should have your eyes and teeth checked.  Stay up to date on all vaccines. This information is not intended to replace advice given to you by your health care provider. Make sure you discuss any questions you have with your health care provider. Document Revised: 07/20/2018 Document Reviewed: 07/20/2018 Elsevier Patient Education  2020 [employer].

## 2020-07-10 ENCOUNTER — Ambulatory Visit: Payer: Medicare Other | Admitting: Sports Medicine

## 2020-07-10 ENCOUNTER — Encounter: Payer: Self-pay | Admitting: Sports Medicine

## 2020-07-10 ENCOUNTER — Other Ambulatory Visit: Payer: Self-pay

## 2020-07-10 DIAGNOSIS — M79675 Pain in left toe(s): Secondary | ICD-10-CM | POA: Diagnosis not present

## 2020-07-10 DIAGNOSIS — B351 Tinea unguium: Secondary | ICD-10-CM | POA: Diagnosis not present

## 2020-07-10 DIAGNOSIS — M79674 Pain in right toe(s): Secondary | ICD-10-CM | POA: Diagnosis not present

## 2020-07-10 NOTE — Progress Notes (Signed)
Subjective: Antonio Ayers is a 69 y.o. male patient seen today in office with complaint of mildly painful thickened and elongated toenails; unable to trim. Has a history of Anklyosis spondylitis on [place] like previous. Admits a little swelling in ankles. Patient has no other pedal complaints at this time. No changes with medical history since last encounter.   Patient Active Problem List   Diagnosis Date Noted  . Aortic aneurysm ([place])   . Aortic valve regurgitation 01/09/2019  . Nephrolithiasis 01/09/2019  . BPH (benign prostatic hyperplasia) 01/09/2019  . Overweight (BMI 25.0-29.9) 01/09/2019  . Primary osteoarthritis of both hips 12/29/2018  . Primary osteoarthritis of both knees 12/29/2018  . Raised intraocular pressure of both eyes 12/19/2018  . Ankylosing spondylitis of multiple sites in spine ([place]) 12/19/2018  . Hiatal hernia with GERD 11/08/2018  . History of colonoscopy 01/07/2018  . Gall stone in bile duct with infection of gallbladder 12/14/2017  . Hernia of abdominal cavity 12/07/2016  . Dislocated elbow, left, initial encounter 10/07/2016  . Enlarged liver 12/08/2006  . Aortic anomaly 01/07/2005  . Fracture 02/17/2004  . 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 Adalimumab (HUMIRA PEN) 40 MG/0.4ML PNKT Inject 40 mg into the skin every 14 (fourteen) days. 3 each 0  . Cholecalciferol (D3-1000) 25 MCG (1000 UT) capsule Take 1,000 Units by mouth daily.     . COMBIGAN 0.2-0.5 % ophthalmic solution INT 1 GTT IN OU BID    . docusate sodium (COLACE) 100 MG capsule Take 100 mg by mouth daily as needed for mild constipation.    . indapamide (LOZOL) 1.25 MG tablet TK 1 T PO QD    . latanoprost (XALATAN) 0.005 % ophthalmic solution INT 1 GTT IN OS QPM B DINNER    . LOTEMAX 0.5 % GEL INT 1 GTT IN OS D    . Polyethylene Glycol 3350 (MIRALAX PO) Take by mouth as needed.    . potassium citrate (UROCIT-K) 10  MEQ (1080 MG) SR tablet 10 mEq 3 (three) times daily.      No current facility-administered medications on file prior to visit.    No Known Allergies  Objective: Physical Exam  General: Well developed, nourished, no acute distress, awake, alert and oriented x 3  Vascular: Dorsalis pedis artery 2/4 bilateral, Posterior tibial artery 1/4 bilateral, skin temperature warm to warm proximal to distal bilateral lower extremities, no varicosities, pedal hair present bilateral.  Neurological: Gross sensation present via light touch bilateral.   Dermatological: Skin is warm, dry, and supple bilateral, Nails 1-10 are tender, long, thick, and discolored with mild subungal debris, no webspace macerations present bilateral, no open lesions present bilateral, no callus/corns/hyperkeratotic tissue present bilateral. No signs of infection bilateral.  Musculoskeletal:  Asymptomatic hammertoe boney deformities noted bilateral. Muscular strength within normal limits without painon range of motion. No pain with calf compression bilateral.  Assessment and Plan:  Problem List Items Addressed This Visit    None    Visit Diagnoses    Pain due to onychomycosis of toenails of both feet    -  Primary     -Examined patient.  -Re-Discussed treatment options for painful mycotic nails. -Mechanically debrided and reduced mycotic nails with sterile nail nipper and dremel nail file without incident. -Encouraged elevation for edema control -Patient to return in 3 months for follow up evaluation or sooner if symptoms worsen.  Landis Martins, DPM

## 2020-07-18 DIAGNOSIS — H401132 Primary open-angle glaucoma, bilateral, moderate stage: Secondary | ICD-10-CM | POA: Diagnosis not present

## 2020-08-11 ENCOUNTER — Encounter: Payer: Self-pay | Admitting: Internal Medicine

## 2020-08-17 DIAGNOSIS — Z1152 Encounter for screening for COVID-19: Secondary | ICD-10-CM | POA: Diagnosis not present

## 2020-08-18 ENCOUNTER — Telehealth: Payer: Self-pay | Admitting: Cardiology

## 2020-08-18 ENCOUNTER — Encounter: Payer: Self-pay | Admitting: Internal Medicine

## 2020-08-18 NOTE — Telephone Encounter (Signed)
Will forward this message to Dr. Radford Pax and primary covering RN, to advise if this pt would be an acceptable virtual visit for 08/21/19 at 1000 vs office visit, due to recent exposure.  If acceptable to flip to a virtual visit by advisement of Dr. Radford Pax, this message can be sent back to triage to convert the visit and consent the pt thereafter.

## 2020-08-18 NOTE — Telephone Encounter (Signed)
I would prefer to wait 3 weeks and do an in office visit as we have been having issues with virtual platform

## 2020-08-18 NOTE — Telephone Encounter (Signed)
Pt sent a mychart message stating he is getting tested and results will not be back for up to 4 days.  Advised the pt on his mychart message that we will cancel his appt with Dr. Radford Pax on 1/12, and she would like to see him back as an in-person office visit in 3 weeks, if ok for his schedule.  Sent him a message endorsing that Dr. Radford Pax has availabilities during that time on 09/09/20 for a 1140 and 1:40 pm slot.  Advised the pt to mychart message [name] back and let us know if that day would be ok for him, and which time slot would he prefer, if 2/1 works for him.  Will await follow-up message in mychart from the pt.

## 2020-08-18 NOTE — Telephone Encounter (Signed)
Patient is calling in stating that he has been exposed to covid and would like to see about changing his appt with Dr. Radford Pax on 08/20/2020 @ 10:00am from an office visit to a virtual visit. Please advise.

## 2020-08-18 NOTE — Telephone Encounter (Signed)
Left the pt a message to call the office back and request to speak with any triage nurse for further assistance, with cancelling his visit with Dr. Radford Pax, and rescheduling this to an office visit in 3 weeks, as advised per Dr. Radford Pax.

## 2020-08-18 NOTE — Telephone Encounter (Signed)
Pt responded back in mychart and agreed to reschedule his appt with Dr. Radford Pax for 09/09/20 at 1140.  He is aware to arrive 15 mins prior to that appt.  Cancelled the pts 1/12 appt with Dr. Radford Pax.  He states he will let us know his covid results when they get back. Will forward this information to Dr. Theodosia Blender primary covering nurse as an Juluis Rainier.

## 2020-08-20 ENCOUNTER — Ambulatory Visit: Payer: Medicare Other | Admitting: Cardiology

## 2020-08-20 NOTE — Progress Notes (Deleted)
Office Visit Note  Patient: Antonio Ayers             Date of Birth: [birth year]           MRN: [id number]             PCP: [name], [name], MD Referring: [name], [place] [facility]* Visit Date: 09/03/2020 Occupation: @GUAROCC @  Subjective:  No chief complaint on file.   History of Present Illness: Antonio Ayers is a 70 y.o. male ***   Activities of Daily Living:  Patient reports morning stiffness for *** {minute/hour:19697}.   Patient {ACTIONS;DENIES/REPORTS:21021675::"Denies"} nocturnal pain.  Difficulty dressing/grooming: {ACTIONS;DENIES/REPORTS:21021675::"Denies"} Difficulty climbing stairs: {ACTIONS;DENIES/REPORTS:21021675::"Denies"} Difficulty getting out of chair: {ACTIONS;DENIES/REPORTS:21021675::"Denies"} Difficulty using hands for taps, buttons, cutlery, and/or writing: {ACTIONS;DENIES/REPORTS:21021675::"Denies"}  No Rheumatology ROS completed.   PMFS History:  Patient Active Problem List   Diagnosis Date Noted  . Aortic aneurysm ([place])   . Aortic valve regurgitation 01/09/2019  . Nephrolithiasis 01/09/2019  . BPH (benign prostatic hyperplasia) 01/09/2019  . Overweight (BMI 25.0-29.9) 01/09/2019  . Primary osteoarthritis of both hips 12/29/2018  . Primary osteoarthritis of both knees 12/29/2018  . Raised intraocular pressure of both eyes 12/19/2018  . Ankylosing spondylitis of multiple sites in spine ([place]) 12/19/2018  . Hiatal hernia with GERD 11/08/2018  . History of colonoscopy 01/07/2018  . Gall stone in bile duct with infection of gallbladder 12/14/2017  . Hernia of abdominal cavity 12/07/2016  . Dislocated elbow, left, initial encounter 10/07/2016  . Enlarged liver 12/08/2006  . Aortic anomaly 01/07/2005  . Fracture 02/17/2004  . 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 Abdominal hernia   . Ankylosing spondylitis ([place])   . Aortic aneurysm ([facility])    mildly dilated ascending aorta at 65mm and aortic root 37mm  by echo 03/2020  . Aortic insufficiency   . Chronic diarrhea   . Colon polyp   . GERD (gastroesophageal reflux disease)   . Nephrolithiasis     Family History  Problem Relation Age of Onset  . Stroke Mother   . Stroke Father   . Heart failure Father   . Diabetes Sister   . Obesity Sister   . Hiatal hernia Sister    Past Surgical History:  Procedure Laterality Date  . CATARACT EXTRACTION Left 2016  . CHOLECYSTECTOMY  01/2018  . COLONOSCOPY     02/2015  . [place], 2014  . HERNIA REPAIR  2018  . HIATAL HERNIA REPAIR    . KIDNEY STONE SURGERY  03/2018  . PROSTATE BIOPSY     x3  . UPPER GASTROINTESTINAL ENDOSCOPY     Social History   Social History Narrative  . Not on file   Immunization History  Administered Date(s) Administered  . Fluad Quad(high Dose 65+) 04/30/2019, 05/02/2020  . PFIZER SARS-COV-2 Vaccination 09/01/2019, 09/22/2019, 03/30/2020  . Pneumococcal Conjugate-13 02/22/2019  . Pneumococcal Polysaccharide-23 06/24/2020  . Tdap 06/25/2019  . Zoster Recombinat (Shingrix) 02/22/2019, 04/30/2019     Objective: Vital Signs: There were no vitals taken for this visit.   Physical Exam   Musculoskeletal Exam: ***  CDAI Exam: CDAI Score: -- Patient Global: --; Provider Global: -- Swollen: --; Tender: -- Joint Exam 09/03/2020   No joint exam has been documented for this visit   There is currently no information documented on the homunculus. Go to the Rheumatology activity and complete the homunculus joint exam.  Investigation: No additional findings.  Imaging: No results found.  Recent Labs: Lab Results  Component Value Date   WBC 7.1 03/25/2020   HGB 16.0 03/25/2020   PLT 202 03/25/2020   NA 134 03/25/2020   K 3.8 03/25/2020   CL 97 03/25/2020   CO2 23 03/25/2020   GLUCOSE 114 (H) 03/25/2020   BUN 12 03/25/2020   CREATININE 0.68 (L) 03/25/2020   BILITOT 2.7 (H) 03/25/2020   ALKPHOS 85 03/25/2020   AST 16 03/25/2020    ALT 18 03/25/2020   PROT 6.6 03/25/2020   ALBUMIN 4.5 03/25/2020   CALCIUM 9.0 03/25/2020   GFRAA 112 03/25/2020   QFTBGOLDPLUS NEGATIVE 01/01/2020    Speciality Comments: No specialty comments available.  Procedures:  No procedures performed Allergies: Patient has no known allergies.   Assessment / Plan:     Visit Diagnoses: No diagnosis found.  Orders: No orders of the defined types were placed in this encounter.  No orders of the defined types were placed in this encounter.   Face-to-face time spent with patient was *** minutes. Greater than 50% of time was spent in counseling and coordination of care.  Follow-Up Instructions: No follow-ups on file.   Earnestine Mealing, CMA  Note - This record has been created using Editor, commissioning.  Chart creation errors have been sought, but may not always  have been located. Such creation errors do not reflect on  the standard of medical care.

## 2020-08-26 ENCOUNTER — Encounter: Payer: Self-pay | Admitting: Internal Medicine

## 2020-08-26 ENCOUNTER — Encounter: Payer: Self-pay | Admitting: Rheumatology

## 2020-08-26 NOTE — Telephone Encounter (Signed)
Please notify patient that he needs labs which will include CBC with differential and CMP with GFR.  His labs are past due.  Labs should be obtained every 3 months.  No fasting is needed.  He should continue Humira as prescribed.  We will discuss antibody titers for COVID-19 at the follow-up visit.

## 2020-09-01 ENCOUNTER — Encounter: Payer: Self-pay | Admitting: Rheumatology

## 2020-09-01 NOTE — Telephone Encounter (Signed)
Please advise the patient to update lab work ASAP.  He can come on Monday or Friday afternoon when there will not be patients in the office if he would feel more comfortable doing so.   Ok to switch visit to virtual or postpone until end of February depending on his comfort level.   The CDC recommends receiving a booster shot 5 months after the additional primary shot in immunocompromised patients.

## 2020-09-03 ENCOUNTER — Ambulatory Visit: Payer: Medicare Other | Admitting: Rheumatology

## 2020-09-03 ENCOUNTER — Emergency Department (HOSPITAL_COMMUNITY)
Admission: EM | Admit: 2020-09-03 | Discharge: 2020-09-03 | Disposition: A | Discharge status: Home / Self Care | Payer: Medicare Other | Attending: Emergency Medicine | Admitting: Emergency Medicine

## 2020-09-03 ENCOUNTER — Encounter (HOSPITAL_COMMUNITY): Payer: Self-pay | Admitting: Emergency Medicine

## 2020-09-03 DIAGNOSIS — R5383 Other fatigue: Secondary | ICD-10-CM

## 2020-09-03 DIAGNOSIS — M450 Ankylosing spondylitis of multiple sites in spine: Secondary | ICD-10-CM

## 2020-09-03 DIAGNOSIS — M170 Bilateral primary osteoarthritis of knee: Secondary | ICD-10-CM

## 2020-09-03 DIAGNOSIS — Z79899 Other long term (current) drug therapy: Secondary | ICD-10-CM

## 2020-09-03 DIAGNOSIS — R778 Other specified abnormalities of plasma proteins: Secondary | ICD-10-CM

## 2020-09-03 DIAGNOSIS — K625 Hemorrhage of anus and rectum: Secondary | ICD-10-CM | POA: Diagnosis not present

## 2020-09-03 DIAGNOSIS — H40053 Ocular hypertension, bilateral: Secondary | ICD-10-CM

## 2020-09-03 DIAGNOSIS — M160 Bilateral primary osteoarthritis of hip: Secondary | ICD-10-CM

## 2020-09-03 LAB — TYPE AND SCREEN
ABO/RH(D): A POS
Antibody Screen: NEGATIVE

## 2020-09-03 LAB — COMPREHENSIVE METABOLIC PANEL
ALT: 25 U/L (ref 0–44)
AST: 23 U/L (ref 15–41)
Albumin: 3.9 g/dL (ref 3.5–5.0)
Alkaline Phosphatase: 61 U/L (ref 38–126)
Anion gap: 11 (ref 5–15)
BUN: 16 mg/dL (ref 8–23)
CO2: 24 mmol/L (ref 22–32)
Calcium: 9.2 mg/dL (ref 8.9–10.3)
Chloride: 101 mmol/L (ref 98–111)
Creatinine, Ser: 0.73 mg/dL (ref 0.61–1.24)
GFR, Estimated: >60 mL/min (ref >60)
Glucose, Bld: 113 mg/dL — ABNORMAL HIGH (ref 70–99)
Potassium: 3.4 mmol/L — ABNORMAL LOW (ref 3.5–5.1)
Sodium: 136 mmol/L (ref 135–145)
Total Bilirubin: 3.3 mg/dL — ABNORMAL HIGH (ref 0.3–1.2)
Total Protein: 6.7 g/dL (ref 6.5–8.1)

## 2020-09-03 LAB — CBC
HCT: 41.1 % (ref 39.0–52.0)
Hemoglobin: 15.4 g/dL (ref 13.0–17.0)
MCH: 33.1 pg (ref 26.0–34.0)
MCHC: 37.5 g/dL — ABNORMAL HIGH (ref 30.0–36.0)
MCV: 88.4 fL (ref 80.0–100.0)
Platelets: 170 10*3/uL (ref 150–400)
RBC: 4.65 MIL/uL (ref 4.22–5.81)
RDW: 12.7 % (ref 11.5–15.5)
WBC: 8.1 10*3/uL (ref 4.0–10.5)
nRBC: 0 % (ref 0.0–0.2)

## 2020-09-03 NOTE — Progress Notes (Deleted)
Virtual Visit via Video Note  I connected with Antonio Ayers on 09/03/20 at  4:00 PM EST by a video enabled telemedicine application and verified that I am speaking with the correct person using two identifiers.  Location: Patient: Home  Provider: Clinic  This service was conducted via virtual visit.  Both audio and visual tools were used.  The patient was located at home. I was located in my office.  Consent was obtained prior to the virtual visit and is aware of possible charges through their insurance for this visit.  The patient is an established patient.  Dr. Estanislado Pandy, MD conducted the virtual visit and Hazel Sams, PA-C acted as scribe during the service.  Office staff helped with scheduling follow up visits after the service was conducted.    I discussed the limitations of evaluation and management by telemedicine and the availability of in person appointments. The patient expressed understanding and agreed to proceed.   CC: History of Present Illness: Patient is a 70 year old male with a past medical history of   Review of Systems  Constitutional: Negative for fever and malaise/fatigue.  Eyes: Negative for photophobia, pain, discharge and redness.  Respiratory: Negative for cough, shortness of breath and wheezing.   Cardiovascular: Negative for chest pain and palpitations.  Gastrointestinal: Negative for blood in stool, constipation and diarrhea.  Genitourinary: Negative for dysuria.  Musculoskeletal: Negative for back pain, joint pain, myalgias and neck pain.  Skin: Negative for rash.  Neurological: Negative for dizziness and headaches.  Psychiatric/Behavioral: Negative for depression. The patient is not nervous/anxious and does not have insomnia.       Observations/Objective: Physical Exam HENT:     Head: Normocephalic and atraumatic.  Eyes:     Conjunctiva/sclera: Conjunctivae normal.  Pulmonary:     Effort: Pulmonary effort is normal.  Neurological:     Mental  Status: He is alert and oriented to person, place, and time.  Psychiatric:        Mood and Affect: Mood and affect normal.        Cognition and Memory: Memory normal.        Judgment: Judgment normal.    Patient reports morning stiffness for *** {minute/hour:19697}.   Patient {Actions; denies-reports:120008} nocturnal pain.  Difficulty dressing/grooming: {ACTIONS;DENIES/REPORTS:21021675::"Denies"} Difficulty climbing stairs: {ACTIONS;DENIES/REPORTS:21021675::"Denies"} Difficulty getting out of chair: {ACTIONS;DENIES/REPORTS:21021675::"Denies"} Difficulty using hands for taps, buttons, cutlery, and/or writing: {ACTIONS;DENIES/REPORTS:21021675::"Denies"}   Assessment and Plan: Visit Diagnoses: Ankylosing spondylitis of multiple sites in spine [facility]) - Diagnosed in [place].  Last seen by rheumatologist June 07, 2018:   High risk medication use - Humira 40 mg sq injections every 14 days.    Primary osteoarthritis of both hips: He has limited range of motion of both hip joints.  He experiences stiffness in both hips especially after sitting for long periods of time.  Primary osteoarthritis of both knees: He has limited extension of both knee joints.  No warmth or effusion was noted.  Abnormal SPEP  Raised intraocular pressure of both eyes  Other fatigue   Follow Up Instructions: He will follow up in    I discussed the assessment and treatment plan with the patient. The patient was provided an opportunity to ask questions and all were answered. The patient agreed with the plan and demonstrated an understanding of the instructions.   The patient was advised to call back or seek an in-person evaluation if the symptoms worsen or if the condition fails to improve as anticipated.  I provided ***  minutes of non-face-to-face time during this encounter.   Ofilia Neas, PA-C

## 2020-09-03 NOTE — Discharge Instructions (Addendum)
Call your gastroenterologist tomorrow to set up an appointment for evaluation, return to [name] with any concerning new changes.

## 2020-09-03 NOTE — ED Provider Notes (Signed)
[facility] EMERGENCY DEPARTMENT Provider Note   CSN: [id number] Arrival date & time: 09/03/20  1504     History Chief Complaint  Patient presents with  . Rectal Bleeding    Antonio Ayers is a 70 y.o. male.   Rectal Bleeding Quality:  Bright red Amount:  Moderate Duration:  7 days (but also off and on for years, since 2016) Timing:  Intermittent Relieved by:  Nothing Worsened by:  Nothing Ineffective treatments:  None tried Associated symptoms: no abdominal pain, no fever, no hematemesis, no light-headedness, no loss of consciousness and no vomiting   Risk factors: no anticoagulant use        Past Medical History:  Diagnosis Date  . Abdominal hernia   . Ankylosing spondylitis ([place])   . Aortic aneurysm ([facility])    mildly dilated ascending aorta at 26mm and aortic root 27mm by echo 03/2020  . Aortic insufficiency   . Chronic diarrhea   . Colon polyp   . GERD (gastroesophageal reflux disease)   . Nephrolithiasis     Patient Active Problem List   Diagnosis Date Noted  . Aortic aneurysm ([place])   . Aortic valve regurgitation 01/09/2019  . Nephrolithiasis 01/09/2019  . BPH (benign prostatic hyperplasia) 01/09/2019  . Overweight (BMI 25.0-29.9) 01/09/2019  . Primary osteoarthritis of both hips 12/29/2018  . Primary osteoarthritis of both knees 12/29/2018  . Raised intraocular pressure of both eyes 12/19/2018  . Ankylosing spondylitis of multiple sites in spine ([place]) 12/19/2018  . Hiatal hernia with GERD 11/08/2018  . History of colonoscopy 01/07/2018  . Gall stone in bile duct with infection of gallbladder 12/14/2017  . Hernia of abdominal cavity 12/07/2016  . Dislocated elbow, left, initial encounter 10/07/2016  . Enlarged liver 12/08/2006  . Aortic anomaly 01/07/2005  . Fracture 02/17/2004  . Keratoconus 12/07/1977    Past Surgical History:  Procedure Laterality Date  . CATARACT EXTRACTION Left 2016  . CHOLECYSTECTOMY  01/2018  .  COLONOSCOPY     02/2015  . [place], 2014  . HERNIA REPAIR  2018  . HIATAL HERNIA REPAIR    . KIDNEY STONE SURGERY  03/2018  . PROSTATE BIOPSY     x3  . UPPER GASTROINTESTINAL ENDOSCOPY         Family History  Problem Relation Age of Onset  . Stroke Mother   . Stroke Father   . Heart failure Father   . Diabetes Sister   . Obesity Sister   . Hiatal hernia Sister     Social History   Tobacco Use  . Smoking status: Never Smoker  . Smokeless tobacco: Never Used  Vaping Use  . Vaping Use: Never used  Substance Use Topics  . Alcohol use: Yes    Comment: occ  . Drug use: Not Currently    Home Medications Prior to Admission medications   Medication Sig Start Date End Date Taking? Authorizing Provider  Adalimumab (HUMIRA PEN) 40 MG/0.4ML PNKT Inject 40 mg into the skin every 14 (fourteen) days. 04/01/20   Bo Merino, MD  Cholecalciferol (D3-1000) 25 MCG (1000 UT) capsule Take 1,000 Units by mouth daily.  11/08/18   [provider]  COMBIGAN 0.2-0.5 % ophthalmic solution INT 1 GTT IN OU BID 12/07/18   [provider]  docusate sodium (COLACE) 100 MG capsule Take 100 mg by mouth daily as needed for mild constipation.    [provider]  indapamide (LOZOL) 1.25 MG tablet TK 1 T  PO QD 04/06/19   [provider]  latanoprost (XALATAN) 0.005 % ophthalmic solution INT 1 GTT IN OS QPM B DINNER 11/09/18   [provider]  LOTEMAX 0.5 % GEL INT 1 GTT IN OS D 10/07/18   [provider]  Polyethylene Glycol 3350 (MIRALAX PO) Take by mouth as needed.    [provider]  potassium citrate (UROCIT-K) 10 MEQ (1080 MG) SR tablet 10 mEq 3 (three) times daily.  11/08/18   [provider]    Allergies    Patient has no known allergies.  Review of Systems   Review of Systems  Constitutional: Negative for chills and fever.  HENT: Negative for congestion and rhinorrhea.   Respiratory: Negative for cough  and shortness of breath.   Cardiovascular: Negative for chest pain and palpitations.  Gastrointestinal: Positive for anal bleeding, blood in stool and hematochezia. Negative for abdominal pain, diarrhea, hematemesis, nausea and vomiting.  Genitourinary: Negative for difficulty urinating and dysuria.  Musculoskeletal: Negative for arthralgias and back pain.  Skin: Negative for color change and rash.  Neurological: Negative for loss of consciousness, light-headedness and headaches.    Physical Exam Updated Vital Signs BP (!) 138/93 (BP Location: Right Arm)   Pulse 87   Temp 98.5 F (36.9 C) (Oral)   Resp 17   Ht 6\' 1"  (1.854 m)   Wt 95.3 kg   SpO2 97%   BMI 27.71 kg/m   Physical Exam Vitals and nursing note reviewed. Exam conducted with a chaperone present.  Constitutional:      General: He is not in acute distress.    Appearance: Normal appearance.  HENT:     Head: Normocephalic and atraumatic.     Nose: No rhinorrhea.  Eyes:     General:        Right eye: No discharge.        Left eye: No discharge.     Conjunctiva/sclera: Conjunctivae normal.  Cardiovascular:     Rate and Rhythm: Normal rate and regular rhythm.  Pulmonary:     Effort: Pulmonary effort is normal.     Breath sounds: No stridor.  Abdominal:     General: Abdomen is flat. There is no distension.     Palpations: Abdomen is soft.     Tenderness: There is no abdominal tenderness. There is no guarding or rebound.  Genitourinary:    Comments: Areas of evidence of external hemorrhoid, none currently engorged or thrombosed, some dried bright red blood around the anus.  Digital rectal exam shows empty rectal vault with no melena or bright red blood.,  Nontender. Musculoskeletal:        General: No deformity or signs of injury.  Skin:    General: Skin is warm and dry.  Neurological:     General: No focal deficit present.     Mental Status: He is alert. Mental status is at baseline.     Motor: No weakness.   Psychiatric:        Mood and Affect: Mood normal.        Behavior: Behavior normal.        Thought Content: Thought content normal.     ED Results / Procedures / Treatments   Labs (all labs ordered are listed, but only abnormal results are displayed) Labs Reviewed  COMPREHENSIVE METABOLIC PANEL - Abnormal; Notable for the following components:      Result Value   Potassium 3.4 (*)    Glucose, Bld 113 (*)  Total Bilirubin 3.3 (*)    All other components within normal limits  CBC - Abnormal; Notable for the following components:   MCHC 37.5 (*)    All other components within normal limits  POC OCCULT BLOOD, ED  TYPE AND SCREEN  ABO/RH    EKG None  Radiology No results found.  Procedures Procedures   Medications Ordered in ED Medications - No data to display  ED Course  I have reviewed the triage vital signs and the nursing notes.  Pertinent labs & imaging results that were available during my care of the patient were reviewed by me and considered in my medical decision making (see chart for details).    MDM Rules/Calculators/A&P                          Bright red blood per rectum likely hemorrhoid.  Patient had colonoscopy in the past, had internal hemorrhoids and had polyps, has had off-and-on bleeding for years, most recently last week has been more persistent.  Approximately 2 episodes a day.  Denies chest pain lightheadedness shortness of breath syncope or any other symptoms associated with anemia.  Hemoglobin here stable after my review.  Vital signs are stable.  Patient's baseline is for her blood pressure on the lower end of normal and is consistent with today.  He denies any new pain.  He feels comfortable following up with his gastroenterologist.  Strict return precautions discussed outpatient follow-up recommended. Final Clinical Impression(s) / ED Diagnoses Final diagnoses:  Rectal bleeding    Rx / DC Orders ED Discharge Orders    None        Breck Coons, MD 09/03/20 1950

## 2020-09-03 NOTE — ED Triage Notes (Signed)
Patient reports bright red rectal bleeding over the last several days. Is not anticoagulated. Patient complains of itching and burning sensation in anus.

## 2020-09-04 ENCOUNTER — Telehealth: Payer: Self-pay | Admitting: Internal Medicine

## 2020-09-04 ENCOUNTER — Telehealth: Payer: Self-pay

## 2020-09-04 ENCOUNTER — Other Ambulatory Visit: Payer: Self-pay

## 2020-09-04 ENCOUNTER — Telehealth: Payer: Medicare Other | Admitting: Rheumatology

## 2020-09-04 DIAGNOSIS — H40053 Ocular hypertension, bilateral: Secondary | ICD-10-CM

## 2020-09-04 DIAGNOSIS — M170 Bilateral primary osteoarthritis of knee: Secondary | ICD-10-CM

## 2020-09-04 DIAGNOSIS — R5383 Other fatigue: Secondary | ICD-10-CM

## 2020-09-04 DIAGNOSIS — R778 Other specified abnormalities of plasma proteins: Secondary | ICD-10-CM

## 2020-09-04 DIAGNOSIS — Z79899 Other long term (current) drug therapy: Secondary | ICD-10-CM

## 2020-09-04 DIAGNOSIS — M160 Bilateral primary osteoarthritis of hip: Secondary | ICD-10-CM

## 2020-09-04 DIAGNOSIS — M450 Ankylosing spondylitis of multiple sites in spine: Secondary | ICD-10-CM

## 2020-09-04 NOTE — Telephone Encounter (Signed)
Pt is calling in stating that he went to the ER ([facility]) on 09/03/2020 for rectal bleeding and they did a lot of test and would like for Dr. Jerilee Hoh to take a look at the results and advise accordingly.  Pt has a appointment with Nicoletta Ba, PA on Wednesday 09/10/2020 @ 2:00 pm.  Pt would like to have a call or can be sent a msg via mychart.

## 2020-09-04 NOTE — Telephone Encounter (Signed)
Follow up has been scheduled for 10-16-2020

## 2020-09-04 NOTE — Telephone Encounter (Signed)
Left detailed message on machine for patient to call and schedule a virtual or office visit.

## 2020-09-04 NOTE — Telephone Encounter (Signed)
Please schedule ED follow up if he would like to discuss.

## 2020-09-04 NOTE — Telephone Encounter (Signed)
Antonio Stabler, MD  Elias Else, CMA I saw this patient for initial office consult several months ago.   His chart was forwarded to me after recent ED visit for rectal bleeding.   Please arrange next available clinic follow-up with me.   - HD

## 2020-09-05 ENCOUNTER — Telehealth (INDEPENDENT_AMBULATORY_CARE_PROVIDER_SITE_OTHER): Payer: Medicare Other | Admitting: Rheumatology

## 2020-09-05 ENCOUNTER — Telehealth (INDEPENDENT_AMBULATORY_CARE_PROVIDER_SITE_OTHER): Payer: Medicare Other | Admitting: Internal Medicine

## 2020-09-05 ENCOUNTER — Encounter: Payer: Self-pay | Admitting: Internal Medicine

## 2020-09-05 ENCOUNTER — Encounter: Payer: Self-pay | Admitting: Rheumatology

## 2020-09-05 VITALS — Ht 73.0 in

## 2020-09-05 VITALS — Ht 73.0 in | Wt 220.0 lb

## 2020-09-05 DIAGNOSIS — R5383 Other fatigue: Secondary | ICD-10-CM | POA: Diagnosis not present

## 2020-09-05 DIAGNOSIS — R778 Other specified abnormalities of plasma proteins: Secondary | ICD-10-CM

## 2020-09-05 DIAGNOSIS — H40053 Ocular hypertension, bilateral: Secondary | ICD-10-CM | POA: Diagnosis not present

## 2020-09-05 DIAGNOSIS — Z09 Encounter for follow-up examination after completed treatment for conditions other than malignant neoplasm: Secondary | ICD-10-CM

## 2020-09-05 DIAGNOSIS — M160 Bilateral primary osteoarthritis of hip: Secondary | ICD-10-CM

## 2020-09-05 DIAGNOSIS — M450 Ankylosing spondylitis of multiple sites in spine: Secondary | ICD-10-CM | POA: Diagnosis not present

## 2020-09-05 DIAGNOSIS — K921 Melena: Secondary | ICD-10-CM | POA: Diagnosis not present

## 2020-09-05 DIAGNOSIS — M170 Bilateral primary osteoarthritis of knee: Secondary | ICD-10-CM

## 2020-09-05 DIAGNOSIS — Z79899 Other long term (current) drug therapy: Secondary | ICD-10-CM

## 2020-09-05 NOTE — Progress Notes (Signed)
Virtual Visit via Video Note  I connected with Antonio Ayers on 09/05/20 at 12:00 PM EST by a video enabled telemedicine application and verified that I am speaking with the correct person using two identifiers.  Location: Patient: Home  Provider: Clinic   This service was conducted via virtual visit.  Both audio and visual tools were used.  The patient was located at home. I was located in my office.  Consent was obtained prior to the virtual visit and is aware of possible charges through their insurance for this visit.  The patient is an established patient.  Dr. Estanislado Pandy, MD conducted the virtual visit and Hazel Sams, PA-C acted as scribe during the service.  Office staff helped with scheduling follow up visits after the service was conducted.   I discussed the limitations of evaluation and management by telemedicine and the availability of in person appointments. The patient expressed understanding and agreed to proceed.  CC: Medication monitoring  History of Present Illness: Patient is a 70 year male with a past medical history of ankylosing spondylitis and osteoarthritis.  He is on humira 40 mg sq injections every 14 days.  He has not missed any doses of humira recently.  He feels that Antonio Ayers has been keeping his arthritis stable.  He continues to have pain in both knees and both hips.  He has some stiffness with hip flexion. He denies any joint swelling at this time. He has not had any recent infections. He has received 3 pfizer covid-19 vaccine doses.   He has an appointment today with his PCP as a follow up after being evaluated in the ED on 09/03/20 for rectal bleeding.  He has not had any recurrence of the rectal bleeding since being discharged.    Review of Systems  Constitutional: Positive for malaise/fatigue. Negative for fever.  HENT: Positive for congestion.   Eyes: Negative for photophobia, pain, discharge and redness.  Respiratory: Positive for cough. Negative for  shortness of breath and wheezing.   Cardiovascular: Positive for leg swelling. Negative for chest pain and palpitations.  Gastrointestinal: Positive for constipation and diarrhea. Negative for blood in stool.  Genitourinary: Negative for dysuria and urgency.  Musculoskeletal: Positive for joint pain. Negative for back pain, myalgias and neck pain.  Skin: Positive for rash.  Neurological: Negative for dizziness, weakness and headaches.  Endo/Heme/Allergies: Does not bruise/bleed easily.  Psychiatric/Behavioral: Negative for depression. The patient has insomnia. The patient is not nervous/anxious.       Observations/Objective: Patient reports morning stiffness for 10 minutes.   Patient denies nocturnal pain.  Difficulty dressing/grooming: Reports Difficulty climbing stairs: Reports Difficulty getting out of chair: Reports Difficulty using hands for taps, buttons, cutlery, and/or writing: Denies   Physical Exam HENT:     Head: Normocephalic and atraumatic.  Eyes:     Conjunctiva/sclera: Conjunctivae normal.  Pulmonary:     Effort: Pulmonary effort is normal.  Neurological:     Mental Status: He is alert and oriented to person, place, and time.  Psychiatric:        Mood and Affect: Mood and affect normal.        Cognition and Memory: Memory normal.        Judgment: Judgment normal.      Assessment and Plan: Visit Diagnoses: Ankylosing spondylitis of multiple sites in spine [facility]) - Diagnosed in [place].  Last seen by previous rheumatologist June 07, 2018: He has not had any signs or symptoms of a flare recently.  He is  clinically doing well on Humira 40 gm sq injections every 14 days.  He has not missed any doses recently. He continues to have mild discomfort in both hips and both knees.  He has stiffness with hip flexion bilaterally.  He has not noticed any joint swelling. He is not experiencing any SI joint at this time.  He has not had any nocturnal pain. He has not had any  recent infections. Discussed the importance of holding humira if he develops signs or symptoms of an infection and to resume once the infection has cleared.  He is apprehensive to continue on humira during the covid-19 pandemic, so we discussed spacing the dose of Humira to every 21 days.  If he develops increased joint pain, stiffness, or joint swelling he was advised to resume humira injections every 14 days.  He will follow up in the office in 3 months.    High risk medication use - Humira 40 mg sq injections every 21 days..  CBC and CMP updated on 09/03/20.  He will be due to update lab work in April and every 3 months to monitor for drug toxicity.  TB gold negative on 01/01/20 and will continue to be monitored yearly.  He has received 3 pfizer covid-19 vaccine doses and was encouraged to receive the booster dose.   He has not had any recent infections.  He is aware that he is to hold humira if he develops signs or symptoms of an infection and to resume once the infection has completely cleared. He will start spacing the dose of humira from every 14 days to every 21 days due to his concern for immunosuppression during the pandemic.   Primary osteoarthritis of both hips: He has persistent discomfort and stiffness in both hips, especially with hip flexion.   Primary osteoarthritis of both knees: He experiences intermittent pain in both knee joints but has not had any joint swelling recently.  He was encouraged to do regular exercise.  Other medical conditions are listed as follows:   Abnormal SPEP  Raised intraocular pressure of both eyes  Other fatigue  Follow Up Instructions: He will follow up in April.    I discussed the assessment and treatment plan with the patient. The patient was provided an opportunity to ask questions and all were answered. The patient agreed with the plan and demonstrated an understanding of the instructions.   The patient was advised to call back or seek an  in-person evaluation if the symptoms worsen or if the condition fails to improve as anticipated.  I provided 25 minutes of non-face-to-face time during this encounter.  Scribed byHazel Sams, PA-C  I reviewed the above note and attest to the accuracy of the document.   Bo Merino, MD

## 2020-09-05 NOTE — Progress Notes (Signed)
Virtual Visit via Video Note  I connected with Antonio Ayers on 09/05/20 at  1:30 PM EST by a video enabled telemedicine application and verified that I am speaking with the correct person using two identifiers.  Location patient: home Location provider: work office Persons participating in the virtual visit: patient, provider  I discussed the limitations of evaluation and management by telemedicine and the availability of in person appointments. The patient expressed understanding and agreed to proceed.   HPI: He has scheduled this visit as a follow-up to his ED visit on January 26.  Apparently he started having bright blood per rectum, called nurse triage who advised him to go to the ED.  He had a colonoscopy in 2016 and tells me that this showed internal hemorrhoids.  In the ED his hemoglobin was stable in the 15 range.  He was told he probably had external hemorrhoids and was sent home.  He has been doing well since, has not noticed any further bleeding.  He has been taking Colace and MiraLAX to keep stools soft.   ROS: Constitutional: Denies fever, chills, diaphoresis, appetite change and fatigue.  HEENT: Denies photophobia, eye pain, redness, hearing loss, ear pain, congestion, sore throat, rhinorrhea, sneezing, mouth sores, trouble swallowing, neck pain, neck stiffness and tinnitus.   Respiratory: Denies SOB, DOE, cough, chest tightness,  and wheezing.   Cardiovascular: Denies chest pain, palpitations and leg swelling.  Gastrointestinal: Denies nausea, vomiting, abdominal pain, diarrhea, constipation, blood in stool and abdominal distention.  Genitourinary: Denies dysuria, urgency, frequency, hematuria, flank pain and difficulty urinating.  Endocrine: Denies: hot or cold intolerance, sweats, changes in hair or nails, polyuria, polydipsia. Musculoskeletal: Denies myalgias, back pain, joint swelling, arthralgias and gait problem.  Skin: Denies pallor, rash and wound.   Neurological: Denies dizziness, seizures, syncope, weakness, light-headedness, numbness and headaches.  Hematological: Denies adenopathy. Easy bruising, personal or family bleeding history  Psychiatric/Behavioral: Denies suicidal ideation, mood changes, confusion, nervousness, sleep disturbance and agitation   Past Medical History:  Diagnosis Date  . Abdominal hernia   . Ankylosing spondylitis ([place])   . Aortic aneurysm (HCC)    mildly dilated ascending aorta at 8mm and aortic root 18mm by echo 03/2020  . Aortic insufficiency   . Chronic diarrhea   . Colon polyp   . GERD (gastroesophageal reflux disease)   . Nephrolithiasis     Past Surgical History:  Procedure Laterality Date  . CATARACT EXTRACTION Left 2016  . CHOLECYSTECTOMY  01/2018  . COLONOSCOPY     02/2015  . [place], 2014  . HERNIA REPAIR  2018  . HIATAL HERNIA REPAIR    . KIDNEY STONE SURGERY  03/2018  . PROSTATE BIOPSY     x3  . UPPER GASTROINTESTINAL ENDOSCOPY      Family History  Problem Relation Age of Onset  . Stroke Mother   . Stroke Father   . Heart failure Father   . Diabetes Sister   . Obesity Sister   . Hiatal hernia Sister     SOCIAL HX:   reports that he has never smoked. He has never used smokeless tobacco. He reports current alcohol use. He reports previous drug use.   Current Outpatient Medications:  .  Adalimumab (HUMIRA PEN) 40 MG/0.4ML PNKT, Inject 40 mg into the skin every 14 (fourteen) days., Disp: 3 each, Rfl: 0 .  Cholecalciferol 25 MCG (1000 UT) capsule, Take 1,000 Units by mouth daily. , Disp: , Rfl:  .  COMBIGAN 0.2-0.5 % ophthalmic solution, INT 1 GTT IN OU BID, Disp: , Rfl:  .  docusate sodium (COLACE) 100 MG capsule, Take 100 mg by mouth daily as needed for mild constipation., Disp: , Rfl:  .  indapamide (LOZOL) 1.25 MG tablet, TK 1 T PO QD, Disp: , Rfl:  .  latanoprost (XALATAN) 0.005 % ophthalmic solution, INT 1 GTT IN OS QPM B DINNER, Disp: , Rfl:  .   LOTEMAX 0.5 % GEL, INT 1 GTT IN OS D, Disp: , Rfl:  .  Polyethylene Glycol 3350 (MIRALAX PO), Take by mouth as needed., Disp: , Rfl:  .  potassium citrate (UROCIT-K) 10 MEQ (1080 MG) SR tablet, 10 mEq 3 (three) times daily. , Disp: , Rfl:   EXAM:   VITALS per patient if applicable: None reported  GENERAL: alert, oriented, appears well and in no acute distress  HEENT: atraumatic, conjunttiva clear, no obvious abnormalities on inspection of external nose and ears  NECK: normal movements of the head and neck  LUNGS: on inspection no signs of respiratory distress, breathing rate appears normal, no obvious gross increased work of breathing, gasping or wheezing  CV: no obvious cyanosis  MS: moves all visible extremities without noticeable abnormality  PSYCH/NEURO: pleasant and cooperative, no obvious depression or anxiety, speech and thought processing grossly intact  ASSESSMENT AND PLAN:   Hospital discharge follow-up  [facility] charts have been reviewed. -He has had no further bleeding. -He has appointment in early March with GI. -He knows to contact me if any further issues.     I discussed the assessment and treatment plan with the patient. The patient was provided an opportunity to ask questions and all were answered. The patient agreed with the plan and demonstrated an understanding of the instructions.   The patient was advised to call back or seek an in-person evaluation if the symptoms worsen or if the condition fails to improve as anticipated.    Lelon Frohlich, MD  [facility]

## 2020-09-08 ENCOUNTER — Telehealth: Payer: Self-pay | Admitting: Rheumatology

## 2020-09-08 ENCOUNTER — Telehealth: Payer: Self-pay

## 2020-09-08 NOTE — Telephone Encounter (Signed)
Please advise the patient to reach out to his PCP to discuss antibody testing.    ACR does not recommend routine antibody testing.

## 2020-09-08 NOTE — Telephone Encounter (Signed)
Patient left a message stating he has a few questions regarding his Humira injection he is due for today, and the antibody treatment. Please call to advise.

## 2020-09-08 NOTE — Telephone Encounter (Signed)
Attempted to contact the patient and left message for patient to call the office.  Patient is due a follow up visit in April 2022.

## 2020-09-08 NOTE — Telephone Encounter (Signed)
Patient called stating he is planning to get his 2nd booster vaccine sometime in February and plans to hold off on taking his Humira injection until he gets the vaccine.    Patient states "he would like to arrange to get antibody testing before and after the booster to measure his levels."

## 2020-09-08 NOTE — Telephone Encounter (Signed)
  Patient advised to reach out to his PCP to discuss antibody testing.    ACR does not recommend routine antibody testing.

## 2020-09-08 NOTE — Telephone Encounter (Signed)
plans to hold off on taking his Humira injection until he gets the vaccine.  Patient advised it is not recommended for him to hold his Humira. Patient advised he would have to make that choice.

## 2020-09-09 ENCOUNTER — Other Ambulatory Visit: Payer: Self-pay

## 2020-09-09 ENCOUNTER — Encounter: Payer: Self-pay | Admitting: Cardiology

## 2020-09-09 ENCOUNTER — Telehealth (INDEPENDENT_AMBULATORY_CARE_PROVIDER_SITE_OTHER): Payer: Medicare Other | Admitting: Cardiology

## 2020-09-09 VITALS — Ht 73.0 in | Wt 210.0 lb

## 2020-09-09 DIAGNOSIS — I77819 Aortic ectasia, unspecified site: Secondary | ICD-10-CM

## 2020-09-09 DIAGNOSIS — I2510 Atherosclerotic heart disease of native coronary artery without angina pectoris: Secondary | ICD-10-CM

## 2020-09-09 DIAGNOSIS — R931 Abnormal findings on diagnostic imaging of heart and coronary circulation: Secondary | ICD-10-CM | POA: Insufficient documentation

## 2020-09-09 DIAGNOSIS — E7800 Pure hypercholesterolemia, unspecified: Secondary | ICD-10-CM

## 2020-09-09 DIAGNOSIS — I351 Nonrheumatic aortic (valve) insufficiency: Secondary | ICD-10-CM | POA: Diagnosis not present

## 2020-09-09 MED ORDER — ATORVASTATIN CALCIUM 20 MG PO TABS: 20.0000 mg | ORAL_TABLET | Freq: Every day | ORAL | 3 refills | Status: DC

## 2020-09-09 NOTE — Patient Instructions (Signed)
Medication Instructions:  Your physician has recommended you make the following change in your medication:  1) START taking Lipitor (atorvastatin) 20 mg daily  *If you need a refill on your cardiac medications before your next appointment, please call your pharmacy*   Lab Work: Fasting lipids and liver on 3/15 anytime between 7:30am and 4:30pm  Follow-Up: At [facility], you and your health needs are our priority.  As part of our continuing mission to provide you with exceptional heart care, we have created designated Provider Care Teams.  These Care Teams include your primary Cardiologist (physician) and Advanced Practice Providers (APPs -  Physician Assistants and Nurse Practitioners) who all work together to provide you with the care you need, when you need it.  Your next appointment:   3 month(s)  The format for your next appointment:   In Person  Provider:   [name], MD  Other Instructions Please drink at least 64 oz of fluids daily  Avoid caffeine and alcohol Wear compression hose during the day

## 2020-09-09 NOTE — Progress Notes (Signed)
Virtual Visit via Video Note   This visit type was conducted due to national recommendations for restrictions regarding the COVID-19 Pandemic (e.g. social distancing) in an effort to limit this patient's exposure and mitigate transmission in our community.  Due to his co-morbid illnesses, this patient is at least at moderate risk for complications without adequate follow up.  This format is felt to be most appropriate for this patient at this time.  All issues noted in this document were discussed and addressed.  A limited physical exam was performed with this format.  Please refer to the patient's chart for his consent to telehealth for [facility].   Evaluation Performed:  Follow-up visit  This visit type was conducted due to national recommendations for restrictions regarding the COVID-19 Pandemic (e.g. social distancing).  This format is felt to be most appropriate for this patient at this time.  All issues noted in this document were discussed and addressed.  No physical exam was performed (except for noted visual exam findings with Video Visits).  Please refer to the patient's chart (MyChart message for video visits and phone note for telephone visits) for the patient's consent to telehealth for [facility].  Date:  09/09/2020   ID:  Antonio Ayers, DOB 1951-03-04, MRN [id number]  Patient Location:  Home  Provider location:   [place]  PCP:  [name], [name], MD  Cardiologist:  [name], MD Electrophysiologist:  None   Chief Complaint:  Aortic insuff  History of Present Illness:    Antonio Ayers is a 70 y.o. male who presents via audio/video conferencing for a telehealth visit today.    This is a 70yo male with a hx of ankylosing spondylitis followed by Rheum, BPH, glaucoma and AI followed with serial echo.  He was found to have a heart murmur in 2002 and a 2D echo confirmed aortic insufficiency.  He has been followed with yearly echo   His last echo  in 03/2020 showed trivial AI and mildly dilated ascending aorta at 16mm with normal LVF and mild LVH with G1DD. Chest CTA showed mildly dilated sinus of Valsalva at 82mm.  There were also coronary artery calcifications in the LAD.  He underwent coronary Ca score which was elevated at 454 and aortic atherosclerosis but no aneurysm.  He is here today for followup and is doing well.  He denies any chest pain or pressure, SOB, DOE, PND, orthopnea,  palpitations or syncope. Rarely he will have some mild LE edea.   He occasionally will have some dizziness during the day and BP usually is around 02VOZD systolic.  Usually it occurs when he is standing up.  He is compliant with hismeds and is tolerating meds with no SE.    The patient does not have symptoms concerning for COVID-19 infection (fever, chills, cough, or new shortness of breath).    Prior CV studies:   The following studies were reviewed today:  2D echo 03/2020 IMPRESSIONS    1. Left ventricular ejection fraction, by estimation, is 60 to 65%. The  left ventricle has normal function. The left ventricle has no regional  wall motion abnormalities. There is mild left ventricular hypertrophy.  Left ventricular diastolic parameters  are consistent with Grade I diastolic dysfunction (impaired relaxation).  2. Right ventricular systolic function is normal. The right ventricular  size is normal. There is normal pulmonary artery systolic pressure.  3. The mitral valve is normal in structure. No evidence of mitral valve  regurgitation. No evidence  of mitral stenosis.  4. The aortic valve is tricuspid. Aortic valve regurgitation is trivial.  Mild aortic valve sclerosis is present, with no evidence of aortic valve  stenosis.  5. Aortic dilatation noted. There is mild dilatation of the ascending  aorta measuring 39 mm.  6. The inferior vena cava is normal in size with greater than 50%  respiratory variability, suggesting right atrial pressure  of 3 mmHg.   Past Medical History:  Diagnosis Date  . Abdominal hernia   . Ankylosing spondylitis ([place])   . Aortic aneurysm (HCC)    mildly dilated ascending aorta at 60mm and aortic root 76mm by echo 03/2020  . Aortic insufficiency   . Chronic diarrhea   . Colon polyp   . GERD (gastroesophageal reflux disease)   . Nephrolithiasis    Past Surgical History:  Procedure Laterality Date  . CATARACT EXTRACTION Left 2016  . CHOLECYSTECTOMY  01/2018  . COLONOSCOPY     02/2015  . [place], 2014  . HERNIA REPAIR  2018  . HIATAL HERNIA REPAIR    . KIDNEY STONE SURGERY  03/2018  . PROSTATE BIOPSY     x3  . UPPER GASTROINTESTINAL ENDOSCOPY       Current Meds  Medication Sig  . Adalimumab (HUMIRA PEN) 40 MG/0.4ML PNKT Inject 40 mg into the skin every 14 (fourteen) days.  . Cholecalciferol 25 MCG (1000 UT) capsule Take 1,000 Units by mouth daily.   . COMBIGAN 0.2-0.5 % ophthalmic solution INT 1 GTT IN OU BID  . docusate sodium (COLACE) 100 MG capsule Take 100 mg by mouth daily as needed for mild constipation.  . indapamide (LOZOL) 1.25 MG tablet TK 1 T PO QD  . latanoprost (XALATAN) 0.005 % ophthalmic solution INT 1 GTT IN OS QPM B DINNER  . LOTEMAX 0.5 % GEL INT 1 GTT IN OS D  . Polyethylene Glycol 3350 (MIRALAX PO) Take by mouth as needed.  . potassium citrate (UROCIT-K) 10 MEQ (1080 MG) SR tablet 10 mEq 3 (three) times daily.      Allergies:   Patient has no known allergies.   Social History   Tobacco Use  . Smoking status: Never Smoker  . Smokeless tobacco: Never Used  Vaping Use  . Vaping Use: Never used  Substance Use Topics  . Alcohol use: Yes    Comment: occ  . Drug use: Not Currently     Family Hx: The patient's family history includes Diabetes in his sister; Heart failure in his father; Hiatal hernia in his sister; Obesity in his sister; Stroke in his father and mother.  ROS:   Please see the history of present illness.     All other  systems reviewed and are negative.   Labs/Other Tests and Data Reviewed:    Recent Labs: 09/03/2020: ALT 25; BUN 16; Creatinine, Ser 0.73; Hemoglobin 15.4; Platelets 170; Potassium 3.4; Sodium 136   Recent Lipid Panel Lab Results  Component Value Date/Time   CHOL 135 03/25/2020 08:23 AM   TRIG 48 03/25/2020 08:23 AM   HDL 59 03/25/2020 08:23 AM   CHOLHDL 2.3 03/25/2020 08:23 AM   CHOLHDL 2 06/22/2019 11:05 AM   LDLCALC 65 03/25/2020 08:23 AM    Wt Readings from Last 3 Encounters:  09/09/20 210 lb (95.3 kg)  09/05/20 220 lb (99.8 kg)  09/03/20 210 lb (95.3 kg)     Objective:    Vital Signs:  Ht 6\' 1"  (1.854 m)   Wt  210 lb (95.3 kg)   BMI 27.71 kg/m    Well nourished, well developed male in no acute distress. Well appearing, alert and conversant, regular work of breathing,  good skin color  Eyes- anicteric mouth- oral mucosa is pink  neuro- grossly intact skin- no apparent rash or lesions or cyanosis   ASSESSMENT & PLAN:    1.  Aortic Insufficiency -2D echo 03/2020 with trivial AI  2.  Mildly dilated ascending aorta -measuring 3.9cm on Chest CTA 04/2019 -and 3.9cm on 2D echo 03/2020 -BP controlled  3.  Coronary artery calcifications -coronary calcium noted on chest CT last summer -coronary calcium score elevated at 454 -start  statin  4.  HLD -LDL goal < 70 -LDL was 65 in Aug 2021 -will place on Atorvastatin 20mg  daily to get LDL lower given coronary Ca. -check FLp and ALT in 6 weeks  5. Dizziness -this sounds orthostatic in nature -he is on a diuretic indapamide for kidney stones -encouraged him to drink 64oz of fluid daily and avoid caffeine and ETOH -will send in Rx for compression hose   COVID-19 Education: The signs and symptoms of COVID-19 were discussed with the patient and how to seek care for testing (follow up with PCP or arrange E-visit).  The importance of social distancing was discussed today.  Patient Risk:   After full review of this  patient's clinical status, I feel that they are at least moderate risk at this time.  Time:   Today, I have spent 20 minutes on telemedicine discussing medical problems including AI, coronary calcifications and dilated aorta.  We also reviewed the symptoms of COVID 19 and the ways to protect against contracting the virus with telehealth technology.  I spent an additional 5 minutes reviewing patient's chart including 2D echo and Chest CT.  Medication Adjustments/Labs and Tests Ordered: Current medicines are reviewed at length with the patient today.  Concerns regarding medicines are outlined above.  Tests Ordered: No orders of the defined types were placed in this encounter.  Medication Changes: No orders of the defined types were placed in this encounter.   Disposition:  Follow up in 1 year(s)  Signed, [name], MD  09/09/2020 10:07 AM    [facility]

## 2020-09-09 NOTE — Addendum Note (Signed)
Addended by: Antonieta Iba on: 09/09/2020 11:48 AM   Modules accepted: Orders

## 2020-09-10 ENCOUNTER — Ambulatory Visit: Payer: Medicare Other | Admitting: Physician Assistant

## 2020-09-15 ENCOUNTER — Encounter: Payer: Self-pay | Admitting: Internal Medicine

## 2020-10-09 ENCOUNTER — Ambulatory Visit: Payer: Medicare Other | Admitting: Sports Medicine

## 2020-10-09 ENCOUNTER — Other Ambulatory Visit: Payer: Self-pay

## 2020-10-09 ENCOUNTER — Encounter: Payer: Self-pay | Admitting: Sports Medicine

## 2020-10-09 DIAGNOSIS — B351 Tinea unguium: Secondary | ICD-10-CM | POA: Diagnosis not present

## 2020-10-09 DIAGNOSIS — M79674 Pain in right toe(s): Secondary | ICD-10-CM

## 2020-10-09 DIAGNOSIS — M79675 Pain in left toe(s): Secondary | ICD-10-CM

## 2020-10-09 NOTE — Progress Notes (Signed)
Subjective: Antonio Ayers is a 70 y.o. male patient seen today in office with complaint of mildly painful thickened and elongated toenails; unable to trim. Has a history of Anklyosis spondylitis on Humera unchanged from prior. Admits a little swelling in ankles occasionally using compression socks that help. Patient has no other pedal complaints at this time. No changes with medical history since last encounter.   Patient Active Problem List   Diagnosis Date Noted  . Agatston coronary artery calcium score greater than 400   . Aortic aneurysm ([place])   . Aortic valve regurgitation 01/09/2019  . Nephrolithiasis 01/09/2019  . BPH (benign prostatic hyperplasia) 01/09/2019  . Overweight (BMI 25.0-29.9) 01/09/2019  . Primary osteoarthritis of both hips 12/29/2018  . Primary osteoarthritis of both knees 12/29/2018  . Raised intraocular pressure of both eyes 12/19/2018  . Ankylosing spondylitis of multiple sites in spine ([place]) 12/19/2018  . Hiatal hernia with GERD 11/08/2018  . History of colonoscopy 01/07/2018  . Gall stone in bile duct with infection of gallbladder 12/14/2017  . Hernia of abdominal cavity 12/07/2016  . Dislocated elbow, left, initial encounter 10/07/2016  . Enlarged liver 12/08/2006  . Aortic anomaly 01/07/2005  . Fracture 02/17/2004  . 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 Adalimumab (HUMIRA PEN) 40 MG/0.4ML PNKT Inject 40 mg into the skin every 14 (fourteen) days. 3 each 0  . atorvastatin (LIPITOR) 20 MG tablet Take 1 tablet (20 mg total) by mouth daily. 90 tablet 3  . Cholecalciferol 25 MCG (1000 UT) capsule Take 1,000 Units by mouth daily.     . COMBIGAN 0.2-0.5 % ophthalmic solution INT 1 GTT IN OU BID    . docusate sodium (COLACE) 100 MG capsule Take 100 mg by mouth daily as needed for mild constipation.    . indapamide (LOZOL) 1.25 MG tablet TK 1 T PO QD    . latanoprost (XALATAN) 0.005  % ophthalmic solution INT 1 GTT IN OS QPM B DINNER    . LOTEMAX 0.5 % GEL INT 1 GTT IN OS D    . Polyethylene Glycol 3350 (MIRALAX PO) Take by mouth as needed.    . potassium citrate (UROCIT-K) 10 MEQ (1080 MG) SR tablet 10 mEq 3 (three) times daily.      No current facility-administered medications on file prior to visit.    No Known Allergies  Objective: Physical Exam  General: Well developed, nourished, no acute distress, awake, alert and oriented x 3  Vascular: Dorsalis pedis artery 2/4 bilateral, Posterior tibial artery 1/4 bilateral, skin temperature warm to warm proximal to distal bilateral lower extremities, no varicosities, pedal hair present bilateral.  Neurological: Gross sensation present via light touch bilateral.   Dermatological: Skin is warm, dry, and supple bilateral, Nails 1-10 are tender, long, thick, and discolored with mild subungal debris, no webspace macerations present bilateral, no open lesions present bilateral, no callus/corns/hyperkeratotic tissue present bilateral. No signs of infection bilateral.  Musculoskeletal:  Asymptomatic hammertoe boney deformities noted bilateral. Muscular strength within normal limits without painon range of motion. No pain with calf compression bilateral.  Assessment and Plan:  Problem List Items Addressed This Visit   None   Visit Diagnoses    Pain due to onychomycosis of toenails of both feet    -  Primary     -Examined patient.  -Re-Discussed treatment options for painful mycotic nails. -Mechanically debrided and reduced mycotic nails with sterile nail nipper  and dremel nail file without incident. -Encouraged elevation for edema control and continued use of compression garments -Patient to return in 3 months for follow up evaluation or sooner if symptoms worsen.  Landis Martins, DPM

## 2020-10-16 ENCOUNTER — Encounter: Payer: Self-pay | Admitting: Gastroenterology

## 2020-10-16 ENCOUNTER — Ambulatory Visit: Payer: Medicare Other | Admitting: Gastroenterology

## 2020-10-16 VITALS — BP 118/70 | HR 85 | Ht 73.0 in | Wt 224.6 lb

## 2020-10-16 DIAGNOSIS — K5909 Other constipation: Secondary | ICD-10-CM | POA: Diagnosis not present

## 2020-10-16 DIAGNOSIS — K625 Hemorrhage of anus and rectum: Secondary | ICD-10-CM | POA: Diagnosis not present

## 2020-10-16 MED ORDER — PLENVU 140 G PO SOLR: 1.0000 | ORAL | 0 refills | Status: DC

## 2020-10-16 NOTE — Patient Instructions (Addendum)
If you are age 71 or older, your body mass index should be between 23-30. Your Body mass index is 29.63 kg/m. If this is out of the aforementioned range listed, please consider follow up with your Primary Care Provider.  If you are age 56 or younger, your body mass index should be between 19-25. Your Body mass index is 29.63 kg/m. If this is out of the aformentioned range listed, please consider follow up with your Primary Care Provider.   You have been scheduled for a colonoscopy. Please follow written instructions given to you at your visit today.  Please pick up your prep supplies at the pharmacy within the next 1-3 days. If you use inhalers (even only as needed), please bring them with you on the day of your procedure.  Pick up Doculax at your pharmacy to go along with your procedure.  Follow up pending.  It was a pleasure to see you today!  Dr. Loletha Carrow

## 2020-10-16 NOTE — Progress Notes (Signed)
[facility] Progress Note  Chief Complaint: Rectal bleeding  Subjective  History: I saw Antonio Ayers for office consultation regarding multiple digestive symptoms in September of last year.  He had issues with reflux and prior fundoplication, constipation and bloating.  Extensive records from previous GI care in [place] were reviewed.  Based on available records and polyp history, he was felt to be due for recall colonoscopy about July 2023.  He was in the emergency department in late January for rectal bleeding and then set up for appointment with Antonio Ayers. Antonio Ayers reports intermittent constipation with the need to strain for bowel movements, sometimes if the stool is large or has difficulty getting started.  He believes he had a history of hemorrhoids, he has had occasional "dried blood" on the paper over years, but then a large amount of bleeding for about 2 days prior to the recent ED visit.  He has had some occasional streaks of blood since then but no frank rectal bleeding like what brought him to the ED. Sometimes he has a "shooting pain" in the left upper quadrant that is brief, usually lasting seconds.  ROS: Cardiovascular:  no chest pain Respiratory: no dyspnea Arthralgias Remainder of systems negative except as above The patient's Past Medical, Family and Social History were reviewed and are on file in the EMR.  Objective:  Med list reviewed  Current Outpatient Medications:  .  Adalimumab (HUMIRA PEN) 40 MG/0.4ML PNKT, Inject 40 mg into the skin every 14 (fourteen) days., Disp: 3 each, Rfl: 0 .  atorvastatin (LIPITOR) 20 MG tablet, Take 1 tablet (20 mg total) by mouth daily., Disp: 90 tablet, Rfl: 3 .  Cholecalciferol 25 MCG (1000 UT) capsule, Take 1,000 Units by mouth daily. , Disp: , Rfl:  .  COMBIGAN 0.2-0.5 % ophthalmic solution, INT 1 GTT IN OU BID, Disp: , Rfl:  .  docusate sodium (COLACE) 100 MG capsule, Take 100 mg by mouth daily as needed for mild constipation., Disp: ,  Rfl:  .  indapamide (LOZOL) 1.25 MG tablet, TK 1 T PO QD, Disp: , Rfl:  .  latanoprost (XALATAN) 0.005 % ophthalmic solution, INT 1 GTT IN OS QPM B DINNER, Disp: , Rfl:  .  LOTEMAX 0.5 % GEL, INT 1 GTT IN OS D, Disp: , Rfl:  .  PEG-KCl-NaCl-NaSulf-Na Asc-C (PLENVU) 140 g SOLR, Take 1 kit by mouth as directed., Disp: 1 each, Rfl: 0 .  Polyethylene Glycol 3350 (MIRALAX PO), Take by mouth as needed., Disp: , Rfl:  .  potassium citrate (UROCIT-K) 10 MEQ (1080 MG) SR tablet, 10 mEq 3 (three) times daily. , Disp: , Rfl:    Vital signs in last 24 hrs: Vitals:   10/16/20 1102  BP: 118/70  Pulse: 85  SpO2: 96%   Wt Readings from Last 3 Encounters:  10/16/20 224 lb 9.6 oz (101.9 kg)  09/09/20 210 lb (95.3 kg)  09/05/20 220 lb (99.8 kg)    Physical Exam  Well-appearing  HEENT: sclera anicteric, oral mucosa moist without lesions  Neck: supple, no thyromegaly, JVD or lymphadenopathy  Cardiac: RRR without murmurs, S1S2 heard, no peripheral edema  Pulm: clear to auscultation bilaterally, normal RR and effort noted  Abdomen: soft, no tenderness, with active bowel sounds. No guarding or palpable hepatosplenomegaly.  Skin; warm and dry, no jaundice or rash Rectal: Normal perianal exam, no mass, fissure or tenderness.  He has a mobile soft anal verge lesion that is probably either enlarged hemorrhoidal column or hypertrophied  anal papilla.  Labs:  CBC Latest Ref Rng & Units 09/03/2020 03/25/2020 11/02/2019  WBC 4.0 - 10.5 K/uL 8.1 7.1 6.9  Hemoglobin 13.0 - 17.0 g/dL 15.4 16.0 16.0  Hematocrit 39.0 - 52.0 % 41.1 43.4 45.4  Platelets 150 - 400 K/uL 170 202 191    ___________________________________________ Radiologic studies:   ____________________________________________ Other:   _____________________________________________ Assessment & Plan  Assessment: Encounter Diagnoses  Name Primary?  . Rectal bleeding Yes  . Chronic constipation    Chronic constipation leading to  probable hemorrhoidal bleeding.  Takes a stool softener daily, and will take some MiraLAX about every few days.  I recommended he take a low-dose of MiraLAX daily along with stool softener, perhaps 1/4-3rd of a capful.  I also recommended that during bowel movements see sit for several minutes, elevate feet slightly and try to avoid straining.   Plan: Colonoscopy.  He was agreeable after discussion of procedure and risks.    The benefits and risks of the planned procedure were described in detail with the patient or (when appropriate) their health care proxy.  Risks were outlined as including, but not limited to, bleeding, infection, perforation, adverse medication reaction leading to cardiac or pulmonary decompensation, pancreatitis (if ERCP).  The limitation of incomplete mucosal visualization was also discussed.  No guarantees or warranties were given.  30 minutes were spent on this encounter (including chart review, history/exam, counseling/coordination of care, and documentation) > 50% of that time was spent on counseling and coordination of care.  Topics discussed included: Constipation management, colonoscopy risks and benefits.  Antonio Ayers

## 2020-10-20 DIAGNOSIS — H401132 Primary open-angle glaucoma, bilateral, moderate stage: Secondary | ICD-10-CM | POA: Diagnosis not present

## 2020-10-21 ENCOUNTER — Other Ambulatory Visit: Payer: Self-pay

## 2020-10-21 ENCOUNTER — Other Ambulatory Visit: Payer: Medicare Other | Admitting: *Deleted

## 2020-10-21 DIAGNOSIS — E7800 Pure hypercholesterolemia, unspecified: Secondary | ICD-10-CM

## 2020-10-21 LAB — LIPID PANEL
Chol/HDL Ratio: 2 ratio (ref 0.0–5.0)
Cholesterol, Total: 107 mg/dL (ref 100–199)
HDL: 53 mg/dL (ref >39)
LDL Chol Calc (NIH): 39 mg/dL (ref 0–99)
Triglycerides: 72 mg/dL (ref 0–149)
VLDL Cholesterol Cal: 15 mg/dL (ref 5–40)

## 2020-10-21 LAB — ALT: ALT: 23 IU/L (ref 0–44)

## 2020-10-27 ENCOUNTER — Encounter: Payer: Self-pay | Admitting: Internal Medicine

## 2020-10-27 ENCOUNTER — Encounter: Payer: Self-pay | Admitting: Rheumatology

## 2020-10-27 NOTE — Telephone Encounter (Signed)
Thanks for the update.   [facility] anesthesia reviews all charts well in advance of the procedure date and will contact him if they have any questions or concerns related to his symptoms and the anesthesia.  I think it will be OK.  - HD

## 2020-10-31 ENCOUNTER — Telehealth: Payer: Self-pay | Admitting: Pharmacist

## 2020-10-31 NOTE — Chronic Care Management (AMB) (Signed)
I left the patient a message about his upcoming appointment on 11/03/2020 @ 10:00 am with the clinical pharmacist. He was asked to please have all medication on hand to review with the pharmacist.    Neita Goodnight) Mare Ferrari, [place] Assistant [phone]

## 2020-11-03 ENCOUNTER — Ambulatory Visit (INDEPENDENT_AMBULATORY_CARE_PROVIDER_SITE_OTHER): Payer: Medicare Other | Admitting: Pharmacist

## 2020-11-03 DIAGNOSIS — N200 Calculus of kidney: Secondary | ICD-10-CM

## 2020-11-03 DIAGNOSIS — N400 Enlarged prostate without lower urinary tract symptoms: Secondary | ICD-10-CM

## 2020-11-03 DIAGNOSIS — K219 Gastro-esophageal reflux disease without esophagitis: Secondary | ICD-10-CM

## 2020-11-03 NOTE — Progress Notes (Signed)
Chronic Care Management Pharmacy Note  11/06/2020 Name:  Antonio Ayers MRN:  [id number] DOB:  Sep 14, 1950  Subjective: Antonio Ayers is an 70 y.o. year old male who is a primary patient of [name], [name], MD.  The CCM team was consulted for assistance with disease management and care coordination needs.    Engaged with patient by telephone for follow up visit in response to provider referral for pharmacy case management and/or care coordination services.   Consent to Services:  The patient was given information about Chronic Care Management services, agreed to services, and gave verbal consent prior to initiation of services.  Please see initial visit note for detailed documentation.   Patient Care Team: [name], [name], MD as PCP - General (Internal Medicine) Viona Gilmore, [facility] as Pharmacist (Pharmacist)  Recent office visits: 09/05/20 Domingo Mend, MD: Patient presented for ED visit follow up.  06/24/20 Domingo Mend, MD: Patient presented for annual exam. Pneumovax administered.  Recent consult visits: 10/16/20 Wilfrid Lund, MD Gertie Fey): Patient presented for rectal bleeding and chronic constipation. Recommended a small amount of Miralax daily and colonoscopy planned.  10/09/20 Landis Martins, DPM (podiatry): Patient presented for nail trim.  09/09/20 [name], MD (cardiology): Patient presented for video visit for follow up. Started atorvastatin 20 mg daily.  09/05/20 Bo Merino, MD (rheumatology): Patient presented for video visit for ankylosing spondylitis follow up. No changes made.  05/07/20 Wilfrid Lund, MD (gastro): Patient presented for GERD follow up and scheduling colonoscopy.  11/13/19 Cleotilde Neer: Patient presented for hemangioma of skin follow up. Unable to access notes.  11/05/19 Jola Schmidt (ophthalmology): Patient presented for open-angle glaucoma follow up. Unable to access notes.  Hospital visits: 09/03/20  Patient presented to the ED for rectal bleeding.  Objective:  Lab Results  Component Value Date   CREATININE 0.73 09/03/2020   BUN 16 09/03/2020   GFR 113.56 06/22/2019   GFRNONAA >60 09/03/2020   GFRAA 112 03/25/2020   NA 136 09/03/2020   K 3.4 (L) 09/03/2020   CALCIUM 9.2 09/03/2020   CO2 24 09/03/2020   GLUCOSE 113 (H) 09/03/2020    Lab Results  Component Value Date/Time   HGBA1C 5.0 06/22/2019 11:05 AM   GFR 113.56 06/22/2019 11:05 AM    Last diabetic Eye exam: No results found for: HMDIABEYEEXA  Last diabetic Foot exam: No results found for: HMDIABFOOTEX   Lab Results  Component Value Date   CHOL 107 10/21/2020   HDL 53 10/21/2020   LDLCALC 39 10/21/2020   TRIG 72 10/21/2020   CHOLHDL 2.0 10/21/2020    Hepatic Function Latest Ref Rng & Units 10/21/2020 09/03/2020 03/25/2020  Total Protein 6.5 - 8.1 g/dL - 6.7 6.6  Albumin 3.5 - 5.0 g/dL - 3.9 4.5  AST 15 - 41 U/L - 23 16  ALT 0 - 44 IU/L _0 Alk Phosphatase 38 - 126 U/L - 61 85  Total Bilirubin 0.3 - 1.2 mg/dL - 3.3(H) 2.7(H)    Lab Results  Component Value Date/Time   TSH 0.77 06/22/2019 11:05 AM   TSH 1.59 12/19/2018 12:55 PM    CBC Latest Ref Rng & Units 09/03/2020 03/25/2020 11/02/2019  WBC 4.0 - 10.5 K/uL 8.1 7.1 6.9  Hemoglobin 13.0 - 17.0 g/dL 15.4 16.0 16.0  Hematocrit 39.0 - 52.0 % 41.1 43.4 45.4  Platelets 150 - 400 K/uL 170 202 191    Lab Results  Component Value Date/Time   VD25OH 41.37 06/22/2019 11:05 AM  Clinical ASCVD: Yes  The ASCVD Risk score Mikey Bussing DC Jr., et al., 2013) failed to calculate for the following reasons:   The valid total cholesterol range is 130 to 320 mg/dL    Depression screen [facility] 2/9 06/24/2020 06/22/2019 01/09/2019  Decreased Interest 0 0 0  Down, Depressed, Hopeless 0 0 0  PHQ - 2 Score 0 0 0  Altered sleeping _0 Tired, decreased energy _1 Change in appetite 0 0 0  Feeling bad or failure about yourself  0 0 0  Trouble concentrating 0 0 0   Moving slowly or fidgety/restless 0 0 0  Suicidal thoughts 0 0 0  PHQ-9 Score _2 Difficult doing work/chores Not difficult at all Not difficult at all Not difficult at all      Social History   Tobacco Use  Smoking Status Never Smoker  Smokeless Tobacco Never Used   BP Readings from Last 3 Encounters:  11/06/20 126/77  10/16/20 118/70  09/03/20 117/86   Pulse Readings from Last 3 Encounters:  11/06/20 75  10/16/20 85  09/03/20 88   Wt Readings from Last 3 Encounters:  11/06/20 224 lb (101.6 kg)  10/16/20 224 lb 9.6 oz (101.9 kg)  09/09/20 210 lb (95.3 kg)   BMI Readings from Last 3 Encounters:  11/06/20 29.55 kg/m  10/16/20 29.63 kg/m  09/09/20 27.71 kg/m    Assessment/Interventions: Review of patient past medical history, allergies, medications, health status, including review of consultants reports, laboratory and other test data, was performed as part of comprehensive evaluation and provision of chronic care management services.   SDOH:  (Social Determinants of Health) assessments and interventions performed: No  SDOH Screenings   Alcohol Screen: Not on file  Depression (PHQ2-9): Low Risk   . PHQ-2 Score: 3  Financial Resource Strain: Not on file  Food Insecurity: Not on file  Housing: Not on file  Physical Activity: Not on file  Social Connections: Not on file  Stress: Not on file  Tobacco Use: Low Risk   . Smoking Tobacco Use: Never Smoker  . Smokeless Tobacco Use: Never Used  Transportation Needs: Not on file    CCM Care Plan  No Known Allergies  Medications Reviewed Today    Reviewed by Doran Stabler, MD (Physician) on 11/06/20 at 1523  Med List Status: <None>  Medication Order Taking? Sig Documenting Provider Last Dose Status Informant  0.9 %  sodium chloride infusion [id number]   Nelida Meuse III, MD  Active   Adalimumab (HUMIRA PEN) 40 MG/0.4ML PNKT [id number] Yes Inject 40 mg into the skin every 21 ( twenty-one) days. Bo Merino, MD Past Week Active   atorvastatin (LIPITOR) 20 MG tablet [id number] Yes Take 1 tablet (20 mg total) by mouth daily. Sueanne Margarita, MD 11/05/2020 Active   Cholecalciferol 25 MCG (1000 UT) capsule [id number] Yes Take 1,000 Units by mouth daily.  [provider] 11/06/2020 Active   COMBIGAN 0.2-0.5 % ophthalmic solution [id number] Yes INT 1 GTT IN OU BID [provider] 11/06/2020 Active   docusate sodium (COLACE) 100 MG capsule [id number] No Take 100 mg by mouth daily as needed for mild constipation.  Patient not taking: Reported on 11/06/2020   [provider] Not Taking Active   indapamide (LOZOL) 1.25 MG tablet [id number] Yes TK 1 T PO QD [provider] 11/05/2020 Active   latanoprost (XALATAN) 0.005 % ophthalmic solution [id number] Yes INT 1 GTT IN OS  QPM B DINNER [provider] 11/05/2020 Active   LOTEMAX 0.5 % GEL [id number] Yes INT 1 GTT IN OS D [provider] 11/06/2020 Active   Polyethylene Glycol 3350 (MIRALAX PO) [id number] Yes Take by mouth as needed. [provider] Past Week Active   potassium citrate (UROCIT-K) 10 MEQ (1080 MG) SR tablet [id number] Yes 10 mEq 3 (three) times daily.  [provider] 11/06/2020 Active           Patient Active Problem List   Diagnosis Date Noted  . Agatston coronary artery calcium score greater than 400   . Aortic aneurysm ([place])   . Aortic valve regurgitation 01/09/2019  . Nephrolithiasis 01/09/2019  . BPH (benign prostatic hyperplasia) 01/09/2019  . Overweight (BMI 25.0-29.9) 01/09/2019  . Primary osteoarthritis of both hips 12/29/2018  . Primary osteoarthritis of both knees 12/29/2018  . Raised intraocular pressure of both eyes 12/19/2018  . Ankylosing spondylitis of multiple sites in spine ([place]) 12/19/2018  . Hiatal hernia with GERD 11/08/2018  . History of colonoscopy 01/07/2018  . Gall stone in bile duct with infection of gallbladder 12/14/2017  . Hernia of abdominal  cavity 12/07/2016  . Dislocated elbow, left, initial encounter 10/07/2016  . Enlarged liver 12/08/2006  . Aortic anomaly 01/07/2005  . Fracture 02/17/2004  . Keratoconus 12/07/1977    Immunization History  Administered Date(s) Administered  . Fluad Quad(high Dose 65+) 04/30/2019, 05/02/2020  . PFIZER(Purple Top)SARS-COV-2 Vaccination 09/01/2019, 09/22/2019, 03/30/2020, 09/16/2020  . Pneumococcal Conjugate-13 02/22/2019  . Pneumococcal Polysaccharide-23 06/24/2020  . Tdap 06/25/2019  . Zoster Recombinat (Shingrix) 02/22/2019, 04/30/2019    Conditions to be addressed/monitored:  Hyperlipidemia, GERD and Ankylosing spondylitis, glaucoma, kidney stones, constipation  Care Plan : [place]  Updates made by Viona Gilmore, [place] since 11/06/2020 12:00 AM    Problem: Problem: Hyperlipidemia, GERD and Ankylosing spondylitis, glaucoma, kidney stones, constipation     Long-Range Goal: Patient-Specific Goal   Start Date: 11/03/2020  Expected End Date: 11/03/2021  This Visit's Progress: On track  Priority: High  Note:   Current Barriers:  . Unable to independently monitor therapeutic efficacy  Pharmacist Clinical Goal(s):  Marland Kitchen Patient will achieve adherence to monitoring guidelines and medication adherence to achieve therapeutic efficacy through collaboration with PharmD and provider.   Interventions: . 1:1 collaboration with [name], [name], MD regarding development and update of comprehensive plan of care as evidenced by provider attestation and co-signature . Inter-disciplinary care team collaboration (see longitudinal plan of care) . Comprehensive medication review performed; medication list updated in electronic medical record  Hyperlipidemia/elevated CAC score: (LDL goal < 70) -Controlled -Current treatment: . Atorvastatin 20 mg 1 tablet daily -Medications previously tried: none  -Current dietary patterns: did not discuss -Current exercise habits:  limited mobility -Educated on Cholesterol goals;  Importance of limiting foods high in cholesterol; Exercise goal of 150 minutes per week; -Recommended to continue current medication  Ankylosing spondylitis (Goal: minimize symptoms) -Controlled -Current treatment  . Humira 40 mg inject into the skin every 14 days -Medications previously tried: none  -Recommended to continue current medication Assessed patient finances. Patient is getting Humira with [employer] assist. Patient inquired about an additional COVID booster as he just got his second booster.  GERD with hiatal hernia (Goal: minimize symptoms) -Controlled -Current treatment  . No medications -Medications previously tried: omeprazole  -Recommended to continue as is  Glaucoma (Goal: lower intraocular pressure) -Controlled -Current treatment   Latanoprost 0.005% 1 drop in left before bedtime  Lotemax 0.5% gel - apply to left eye  Bimatoprost-timolol 0.2-0.5% 1 drop in both eyes twice daily  -Medications previously tried: none  -Assessed patient finances. Patient did not fill out patient assistance paperwork from previous visit. Plan to send in the mail.   Constipation (Goal: regular bowel movements) -Controlled -Current treatment   Miralax 1 tbsp a day - just taking this for now  Colace 100 mg 1 tablet as needed  -Medications previously tried: none -Recommended to continue current medication  Kidney stones (Goal: prevent future kidney stones) -Controlled -Current treatment   Indapamide 1.25 mg 1 tablet daily  Potassium 10 mEq 1 tablet three times daily -Medications previously tried: none  -Counseled on drinking more water to help with some periodic dizziness and checking blood pressure with these symptoms   Health Maintenance -Vaccine gaps: none -Current therapy:  Marland Kitchen Vitamin D 1000 unit 1 capsule daily -Educated on Cost vs benefit of each product must be carefully weighed by individual consumer -Patient  is satisfied with current therapy and denies issues -Recommended to continue current medication  Patient Goals/Self-Care Activities . Patient will:  - take medications as prescribed check blood pressure when symptomatic, document, and provide at future appointments target a minimum of 150 minutes of moderate intensity exercise weekly  Follow Up Plan: Telephone follow up appointment with care management team member scheduled for: 6 months      Medication Assistance: Humiraobtained through [facility] medication assistance program.  Enrollment ends 08/08/21  Patient's preferred pharmacy is:  [facility] [place], [place] - [address] [place] [address] Phone: [phone] Fax: [phone]  [facility] - [place], [place] - [address], [place] [phone], [address], [place] [address] Phone: [phone] Fax: [phone]  Uses pill box? No - only has 3 medicines Pt endorses 100% compliance  We discussed: Benefits of medication synchronization, packaging and delivery as well as enhanced pharmacist oversight with [facility]. Patient decided to: Utilize [facility] pharmacy for medication synchronization, packaging and delivery  Care Plan and Follow Up Patient Decision:  Patient agrees to Care Plan and Follow-up.  Plan: Telephone follow up appointment with care management team member scheduled for:  6 months  [name], PharmD [facility] Pharmacist [place] [facility] [place] [phone]

## 2020-11-04 ENCOUNTER — Other Ambulatory Visit: Payer: Self-pay | Admitting: *Deleted

## 2020-11-04 DIAGNOSIS — M450 Ankylosing spondylitis of multiple sites in spine: Secondary | ICD-10-CM

## 2020-11-04 MED ORDER — HUMIRA (2 PEN) 40 MG/0.4ML ~~LOC~~ AJKT: 40.0000 mg | AUTO-INJECTOR | SUBCUTANEOUS | 0 refills | Status: DC

## 2020-11-04 NOTE — Telephone Encounter (Addendum)
RF faxed from [facility]  Next Visit: when do you want patient to return?  Last Visit: 09/05/2020  Last Fill: 04/01/2020  DX: Ankylosing spondylitis of multiple sites in spine   Current Dose per office note 09/05/2020, Humira 40 mg sq injections every 21 days  Labs: 09/03/2020, Potassium 3.4, Glucose 113, Total Bilirubin 3.3, MCHC 37.5,   TB Gold: 01/01/2020, negative  Okay to refill Humira?

## 2020-11-05 ENCOUNTER — Encounter: Payer: Self-pay | Admitting: Certified Registered Nurse Anesthetist

## 2020-11-06 ENCOUNTER — Other Ambulatory Visit: Payer: Self-pay

## 2020-11-06 ENCOUNTER — Encounter: Payer: Self-pay | Admitting: Gastroenterology

## 2020-11-06 ENCOUNTER — Ambulatory Visit (AMBULATORY_SURGERY_CENTER): Payer: Medicare Other | Admitting: Gastroenterology

## 2020-11-06 VITALS — BP 126/77 | HR 75 | Temp 97.8°F | Resp 14 | Ht 73.0 in | Wt 224.0 lb

## 2020-11-06 DIAGNOSIS — K5909 Other constipation: Secondary | ICD-10-CM

## 2020-11-06 DIAGNOSIS — K625 Hemorrhage of anus and rectum: Secondary | ICD-10-CM | POA: Diagnosis not present

## 2020-11-06 DIAGNOSIS — D125 Benign neoplasm of sigmoid colon: Secondary | ICD-10-CM | POA: Diagnosis not present

## 2020-11-06 DIAGNOSIS — K648 Other hemorrhoids: Secondary | ICD-10-CM | POA: Diagnosis not present

## 2020-11-06 DIAGNOSIS — D120 Benign neoplasm of cecum: Secondary | ICD-10-CM | POA: Diagnosis not present

## 2020-11-06 DIAGNOSIS — D122 Benign neoplasm of ascending colon: Secondary | ICD-10-CM | POA: Diagnosis not present

## 2020-11-06 MED ORDER — SODIUM CHLORIDE 0.9 % IV SOLN: 500.0000 mL | Freq: Once | INTRAVENOUS | Status: DC

## 2020-11-06 NOTE — Patient Instructions (Signed)
Discharge instructions given. Handouts on polyps and hemorrhoids. Continue Miralax and stool softener daily to relieve constipation hemorrhoidal bleeding. Avoid sitting for prolonged periods and straining with bowel movements. Refer to colo-rectal surgeon at appointment to be scheduled. (to evaluate anal lesion.) YOU HAD AN ENDOSCOPIC PROCEDURE TODAY AT [place]:   Refer to the procedure report that was given to you for any specific questions about what was found during the examination.  If the procedure report does not answer your questions, please call your gastroenterologist to clarify.  If you requested that your care partner not be given the details of your procedure findings, then the procedure report has been included in a sealed envelope for you to review at your convenience later.  YOU SHOULD EXPECT: Some feelings of bloating in the abdomen. Passage of more gas than usual.  Walking can help get rid of the air that was put into your GI tract during the procedure and reduce the bloating. If you had a lower endoscopy (such as a colonoscopy or flexible sigmoidoscopy) you may notice spotting of blood in your stool or on the toilet paper. If you underwent a bowel prep for your procedure, you may not have a normal bowel movement for a few days.  Please Note:  You might notice some irritation and congestion in your nose or some drainage.  This is from the oxygen used during your procedure.  There is no need for concern and it should clear up in a day or so.  SYMPTOMS TO REPORT IMMEDIATELY:   Following lower endoscopy (colonoscopy or flexible sigmoidoscopy):  Excessive amounts of blood in the stool  Significant tenderness or worsening of abdominal pains  Swelling of the abdomen that is new, acute  Fever of 100F or higher   For urgent or emergent issues, a gastroenterologist can be reached at any hour by calling [phone]. Do not use MyChart messaging for urgent  concerns.    DIET:  We do recommend a small meal at first, but then you may proceed to your regular diet.  Drink plenty of fluids but you should avoid alcoholic beverages for 24 hours.  ACTIVITY:  You should plan to take it easy for the rest of today and you should NOT DRIVE or use heavy machinery until tomorrow (because of the sedation medicines used during the test).    FOLLOW UP: Our staff will call the number listed on your records 48-72 hours following your procedure to check on you and address any questions or concerns that you may have regarding the information given to you following your procedure. If we do not reach you, we will leave a message.  We will attempt to reach you two times.  During this call, we will ask if you have developed any symptoms of COVID 19. If you develop any symptoms (ie: fever, flu-like symptoms, shortness of breath, cough etc.) before then, please call [phone].  If you test positive for Covid 19 in the 2 weeks post procedure, please call and report this information to [name].    If any biopsies were taken you will be contacted by phone or by letter within the next 1-3 weeks.  Please call us at [phone] if you have not heard about the biopsies in 3 weeks.    SIGNATURES/CONFIDENTIALITY: You and/or your care partner have signed paperwork which will be entered into your electronic medical record.  These signatures attest to the fact that that the information above on your After  Visit Summary has been reviewed and is understood.  Full responsibility of the confidentiality of this discharge information lies with you and/or your care-partner.

## 2020-11-06 NOTE — Progress Notes (Signed)
Pt's states no medical or surgical changes since previsit or office visit.  CW - vitals 

## 2020-11-06 NOTE — Progress Notes (Signed)
Report given to PACU, vss 

## 2020-11-06 NOTE — Progress Notes (Signed)
Called to room to assist during endoscopic procedure.  Patient ID and intended procedure confirmed with present staff. Received instructions for my participation in the procedure from the performing physician.  

## 2020-11-06 NOTE — Op Note (Signed)
[facility] Patient Name: Antonio Ayers Procedure Date: 11/06/2020 3:23 PM MRN: [id number] Endoscopist: Mallie Mussel L. Loletha Carrow , MD Age: 70 Referring MD:  Date of Birth: [birth year] Gender: Male Account #: [id number] Procedure:                Colonoscopy Indications:              Rectal bleeding, Constipation Medicines:                Monitored Anesthesia Care Procedure:                Pre-Anesthesia Assessment:                           - Prior to the procedure, a History and Physical                            was performed, and patient medications and                            allergies were reviewed. The patient's tolerance of                            previous anesthesia was also reviewed. The risks                            and benefits of the procedure and the sedation                            options and risks were discussed with the patient.                            All questions were answered, and informed consent                            was obtained. Prior Anticoagulants: The patient has                            taken no previous anticoagulant or antiplatelet                            agents. ASA Grade Assessment: III - A patient with                            severe systemic disease. After reviewing the risks                            and benefits, the patient was deemed in                            satisfactory condition to undergo the procedure.                           After obtaining informed consent, the colonoscope  was passed under direct vision. Throughout the                            procedure, the patient's blood pressure, pulse, and                            oxygen saturations were monitored continuously. The                            Olympus CF-HQ190L (Serial# [id number]) Colonoscope was                            introduced through the anus and advanced to the the                            cecum, identified by  appendiceal orifice and                            ileocecal valve. The colonoscopy was performed with                            difficulty due to a redundant colon and significant                            looping and abdominal breathing. Abdominal pressure                            applied. The quality of the bowel preparation was                            good. The ileocecal valve, appendiceal orifice, and                            rectum were photographed. Scope In: 3:35:57 PM Scope Out: 4:10:49 PM Scope Withdrawal Time: 0 hours 28 minutes 26 seconds  Total Procedure Duration: 0 hours 34 minutes 52 seconds  Findings:                 The digital rectal exam findings include palpable ,                            soft, mobile lesion at anal verge.                           The colon (entire examined portion, especially                            left) was redundant causing scope looping.                           Three sessile polyps were found in the ascending                            colon and cecum. The polyps were 2 to 6 mm  in size.                            These polyps were removed with a cold snare.                            Resection and retrieval were complete.                           A 10 mm polyp was found in the recto-sigmoid colon.                            The polyp was semi-pedunculated. The polyp was                            removed with a cold snare. Resection and retrieval                            were complete. Coagulation for hemostasis after                            polypectomy using snare was successful.                           Internal hemorrhoids were found. The hemorrhoids                            were small.                           A 6 mm polypoid lesion was found at the anal verge.                            The lesion was semi-pedunculated with surface                            ulceration. It was not biopsied due to its location.                            The exam was otherwise without abnormality on                            direct and retroflexion views. Complications:            No immediate complications. Estimated Blood Loss:     Estimated blood loss was minimal. Impression:               - Palpable , soft, mobile lesion at anal verge                            found on digital rectal exam.                           - Redundant colon.                           -  Three 2 to 6 mm polyps in the ascending colon and                            in the cecum, removed with a cold snare. Resected                            and retrieved.                           - One 10 mm polyp at the recto-sigmoid colon,                            removed with a cold snare. Resected and retrieved.                           - Internal hemorrhoids.                           - Likely benign polypoid lesion at the anus.                           - The examination was otherwise normal on direct                            and retroflexion views. Recommendation:           - Patient has a contact number available for                            emergencies. The signs and symptoms of potential                            delayed complications were discussed with the                            patient. Return to normal activities tomorrow.                            Written discharge instructions were provided to the                            patient.                           - Resume previous diet.                           - Continue present medications.                           - Await pathology results.                           - Repeat colonoscopy is recommended for  surveillance. The colonoscopy date will be                            determined after pathology results from today's                            exam become available for review.                           - Continue miralax and stool softener daily  to                            relieve constipation hemorrhoidal bleeding.                           Avoid sitting for prolonged periods and straining                            with bowel movements.                           - Refer to a colo-rectal surgeon at appointment to                            be scheduled. (to evaluate anal lesion) Mallie Mussel L. Loletha Carrow, MD 11/06/2020 4:23:06 PM This report has been signed electronically.

## 2020-11-06 NOTE — Patient Instructions (Signed)
Hi Antonio Ayers,  It was great speaking with you again!  Please reach out to me if you have any questions or need anything before our follow up!  Best, Antonio Ayers  Antonio Ayers, PharmD, [facility]  Visit Information  Goals Addressed   None    Patient Care Plan: [facility]CM Pharmacy Care Plan    Problem Identified: Problem: Hyperlipidemia, GERD and Ankylosing spondylitis, glaucoma, kidney stones, constipation     Long-Range Goal: Patient-Specific Goal   Start Date: 11/03/2020  Expected End Date: 11/03/2021  This Visit's Progress: On track  Priority: High  Note:   Current Barriers:  . Unable to independently monitor therapeutic efficacy  Pharmacist Clinical Goal(s):  Marland Kitchen Patient will achieve adherence to monitoring guidelines and medication adherence to achieve therapeutic efficacy through collaboration with PharmD and provider.   Interventions: . 1:1 collaboration with Antonio Ayers, Antonio Halsted, MD regarding development and update of comprehensive plan of care as evidenced by provider attestation and co-signature . Inter-disciplinary care team collaboration (see longitudinal plan of care) . Comprehensive medication review performed; medication list updated in electronic medical record  Hyperlipidemia/elevated CAC score: (LDL goal < 70) -Controlled -Current treatment: . Atorvastatin 20 mg 1 tablet daily -Medications previously tried: none  -Current dietary patterns: did not discuss -Current exercise habits: limited mobility -Educated on Cholesterol goals;  Importance of limiting foods high in cholesterol; Exercise goal of 150 minutes per week; -Recommended to continue current medication  Ankylosing spondylitis (Goal: minimize symptoms) -Controlled -Current treatment  . Humira 40 mg inject into the skin every 14 days -Medications previously tried: none  -Recommended to continue current medication Assessed patient  finances. Patient is getting Humira with [employer] assist. Patient inquired about an additional COVID booster as he just got his second booster.  GERD with hiatal hernia (Goal: minimize symptoms) -Controlled -Current treatment  . No medications -Medications previously tried: omeprazole  -Recommended to continue as is  Glaucoma (Goal: lower intraocular pressure) -Controlled -Current treatment   Latanoprost 0.005% 1 drop in left before bedtime  Lotemax 0.5% gel - apply to left eye  Bimatoprost-timolol 0.2-0.5% 1 drop in both eyes twice daily  -Medications previously tried: none  -Assessed patient finances. Patient did not fill out patient assistance paperwork from previous visit. Plan to send in the mail.   Constipation (Goal: regular bowel movements) -Controlled -Current treatment   Miralax 1 tbsp a day - just taking this for now  Colace 100 mg 1 tablet as needed  -Medications previously tried: none -Recommended to continue current medication  Kidney stones (Goal: prevent future kidney stones) -Controlled -Current treatment   Indapamide 1.25 mg 1 tablet daily  Potassium 10 mEq 1 tablet three times daily -Medications previously tried: none  -Counseled on drinking more water to help with some periodic dizziness and checking blood pressure with these symptoms   Health Maintenance -Vaccine gaps: none -Current therapy:  Marland Kitchen Vitamin D 1000 unit 1 capsule daily -Educated on Cost vs benefit of each product must be carefully weighed by individual consumer -Patient is satisfied with current therapy and denies issues -Recommended to continue current medication  Patient Goals/Self-Care Activities . Patient will:  - take medications as prescribed check blood pressure when symptomatic, document, and provide at future appointments target a minimum of 150 minutes of moderate intensity exercise weekly  Follow Up Plan: Telephone follow up appointment with care management team  member scheduled for: 6 months       Patient verbalizes understanding of instructions  provided today and agrees to view in [place].  Telephone follow up appointment with pharmacy team member scheduled for: 6 months  Antonio Ayers, [facility]

## 2020-11-07 ENCOUNTER — Telehealth: Payer: Self-pay

## 2020-11-07 NOTE — Telephone Encounter (Signed)
Referral, records, demographic and insurance information faxed to [facility].  

## 2020-11-07 NOTE — Telephone Encounter (Signed)
-----   Message from Doran Stabler, MD sent at 11/06/2020  5:23 PM EDT ----- Please send a referral to Dr Eliezer Mccoy or [facility] at [facility] to evaluate anal lesion found on colonoscopy.

## 2020-11-10 ENCOUNTER — Telehealth: Payer: Self-pay

## 2020-11-10 NOTE — Telephone Encounter (Signed)
  Follow up Call-  Call back number 11/06/2020  Post procedure Call Back phone  # [phone]  Permission to leave phone message Yes  Some recent data might be hidden     Patient questions:  Do you have a fever, pain , or abdominal swelling? No. Pain Score  0 *  Have you tolerated food without any problems? Yes.    Have you been able to return to your normal activities? Yes.    Do you have any questions about your discharge instructions: Diet   No. Medications  No. Follow up visit  No.  Do you have questions or concerns about your Care? No.  Actions: * If pain score is 4 or above: No action needed, pain <4.  1. Have you developed a fever since your procedure? No  2.   Have you had an respiratory symptoms (SOB or cough) since your procedure? No  3.   Have you tested positive for COVID 19 since your procedure No 4.   Have you had any family members/close contacts diagnosed with the COVID 19 since your procedure?  No  If yes to any of these questions please route to Joylene John, RN and Joella Prince, RN

## 2020-11-10 NOTE — Telephone Encounter (Signed)
I am out of the office this week while on the hospital consult service and will contact the patient when I return to the office next week.  - HD

## 2020-11-11 ENCOUNTER — Encounter: Payer: Self-pay | Admitting: Internal Medicine

## 2020-11-13 ENCOUNTER — Encounter: Payer: Self-pay | Admitting: Gastroenterology

## 2020-11-17 ENCOUNTER — Telehealth: Payer: Self-pay | Admitting: Pharmacist

## 2020-11-17 NOTE — Chronic Care Management (AMB) (Signed)
    Chronic Care Management Pharmacy Assistant   Name: Antonio Ayers  MRN: [id number] DOB: 1951/05/22  Reason for Encounter: Patient Assistance Coordination   11/17/2020- Patient assistance form filled out for [place] with [facility] Assist patient assistance program. Called patient to inform placing application in the mail. No answer, left message that I a placing application in the mail, I will highlight and tag all areas patient will need to sign and fill in. Will also tag income documentation that will be needed. Patient notified to take application once he has completed to his Opthalmology office for signature and to fax to company. Patient notified to call back with any questions when application has been received.   Medications: Outpatient Encounter Medications as of 11/17/2020  Medication Sig  . Adalimumab (HUMIRA PEN) 40 MG/0.4ML PNKT Inject 40 mg into the skin every 21 ( twenty-one) days.  Marland Kitchen atorvastatin (LIPITOR) 20 MG tablet Take 1 tablet (20 mg total) by mouth daily.  . Cholecalciferol 25 MCG (1000 UT) capsule Take 1,000 Units by mouth daily.   . COMBIGAN 0.2-0.5 % ophthalmic solution INT 1 GTT IN OU BID  . docusate sodium (COLACE) 100 MG capsule Take 100 mg by mouth daily as needed for mild constipation. (Patient not taking: Reported on 11/06/2020)  . indapamide (LOZOL) 1.25 MG tablet TK 1 T PO QD  . latanoprost (XALATAN) 0.005 % ophthalmic solution INT 1 GTT IN OS QPM B DINNER  . LOTEMAX 0.5 % GEL INT 1 GTT IN OS D  . Polyethylene Glycol 3350 (MIRALAX PO) Take by mouth as needed.  . potassium citrate (UROCIT-K) 10 MEQ (1080 MG) SR tablet 10 mEq 3 (three) times daily.    No facility-administered encounter medications on file as of 11/17/2020.    Star Rating Drugs: Atorvastatin 20 mg- Last filled 09/09/2020 for 90 day supply at [facility].  SIG: Pattricia Boss, [facility] Pharmacist Assistant [phone]

## 2020-12-04 NOTE — Progress Notes (Signed)
Office Visit Note  Patient: Antonio Ayers             Date of Birth: [birth year]           MRN: [id number]             PCP: [name], [name], MD Referring: [name], [facility]* Visit Date: 12/18/2020 Occupation: @GUAROCC @  Subjective:  Medication monitoring   History of Present Illness: Antonio Ayers is a 70 y.o. male with history of ankylosing spondylitis.  Patient is currently on Humira 40 mg subcu injections every 21 days.  He has not missed any doses of these medications recently.  He denies any recent flares.  Overall his symptoms have been stable.  He continues to experience occasional discomfort and stiffness in his neck as well as both hips.  He is not experiencing any SI joint discomfort currently.  He has morning stiffness and nocturnal pain are unchanged.  He has not noticed any joint swelling.  He is not experiencing any muscle weakness at this time.  He experiences occasional pain in both knees and both ankle joints.  He denies any Achilles tendinitis or plantar fasciitis. He denies any recent infections.  He has received 4 Pfizer COVID-19 vaccine doses. He recently established care with Dr. Johney Maine to discuss proceeding with a hemorrhoidectomy soon.     Activities of Daily Living:  Patient reports morning stiffness for 5-10 minutes.   Patient Reports nocturnal pain.  Difficulty dressing/grooming: Reports Difficulty climbing stairs: Reports Difficulty getting out of chair: Reports Difficulty using hands for taps, buttons, cutlery, and/or writing: Denies  Review of Systems  Constitutional: Positive for fatigue.  HENT: Positive for mouth sores and mouth dryness. Negative for nose dryness.   Eyes: Positive for pain, itching, visual disturbance and dryness.  Respiratory: Positive for cough and shortness of breath. Negative for hemoptysis and difficulty breathing.   Cardiovascular: Positive for swelling in legs/feet. Negative for chest pain and  palpitations.  Gastrointestinal: Positive for blood in stool. Negative for abdominal pain, constipation and diarrhea.  Endocrine: Negative for increased urination.  Genitourinary: Negative for painful urination.  Musculoskeletal: Positive for arthralgias, joint pain, joint swelling and morning stiffness. Negative for myalgias, muscle weakness, muscle tenderness and myalgias.  Skin: Positive for rash and redness. Negative for color change.  Allergic/Immunologic: Negative for susceptible to infections.  Neurological: Positive for dizziness, numbness and headaches. Negative for memory loss and weakness.  Hematological: Negative for swollen glands.  Psychiatric/Behavioral: Positive for sleep disturbance. Negative for confusion.    PMFS History:  Patient Active Problem List   Diagnosis Date Noted  . Agatston coronary artery calcium score greater than 400   . Aortic aneurysm ([place])   . Aortic valve regurgitation 01/09/2019  . Nephrolithiasis 01/09/2019  . BPH (benign prostatic hyperplasia) 01/09/2019  . Overweight (BMI 25.0-29.9) 01/09/2019  . Primary osteoarthritis of both hips 12/29/2018  . Primary osteoarthritis of both knees 12/29/2018  . Raised intraocular pressure of both eyes 12/19/2018  . Ankylosing spondylitis of multiple sites in spine ([place]) 12/19/2018  . Hiatal hernia with GERD 11/08/2018  . History of colonoscopy 01/07/2018  . Gall stone in bile duct with infection of gallbladder 12/14/2017  . Hernia of abdominal cavity 12/07/2016  . Dislocated elbow, left, initial encounter 10/07/2016  . Enlarged liver 12/08/2006  . Aortic anomaly 01/07/2005  . Fracture 02/17/2004  . 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 Abdominal hernia   . Agatston coronary artery calcium  score greater than 400    coronary Ca 454 on CT 2021  . Ankylosing spondylitis ([place])   . Aortic aneurysm ([facility])    mildly dilated ascending aorta at 37mm and aortic root 71mm by echo  03/2020  . Aortic insufficiency   . Chronic diarrhea   . Colon polyp   . GERD (gastroesophageal reflux disease)   . Nephrolithiasis     Family History  Problem Relation Age of Onset  . Stroke Mother   . Stroke Father   . Heart failure Father   . Diabetes Sister   . Obesity Sister   . Hiatal hernia Sister   . Colon cancer Neg Hx   . Esophageal cancer Neg Hx   . Rectal cancer Neg Hx   . Stomach cancer Neg Hx    Past Surgical History:  Procedure Laterality Date  . CATARACT EXTRACTION Left 2016  . CHOLECYSTECTOMY  01/2018  . COLONOSCOPY     02/2015  . [place], 2014  . HERNIA REPAIR  2018  . HIATAL HERNIA REPAIR    . KIDNEY STONE SURGERY  03/2018  . PROSTATE BIOPSY     x3  . UPPER GASTROINTESTINAL ENDOSCOPY     Social History   Social History Narrative  . Not on file   Immunization History  Administered Date(s) Administered  . Fluad Quad(high Dose 65+) 04/30/2019, 05/02/2020  . PFIZER(Purple Top)SARS-COV-2 Vaccination 09/01/2019, 09/22/2019, 03/30/2020, 09/16/2020  . Pneumococcal Conjugate-13 02/22/2019  . Pneumococcal Polysaccharide-23 06/24/2020  . Tdap 06/25/2019  . Zoster Recombinat (Shingrix) 02/22/2019, 04/30/2019     Objective: Vital Signs: BP 100/70 (BP Location: Left Arm, Patient Position: Sitting, Cuff Size: Normal)   Pulse 90   Ht 6\' 1"  (1.854 m)   Wt 229 lb (103.9 kg)   BMI 30.21 kg/m    Physical Exam Vitals and nursing note reviewed.  Constitutional:      Appearance: He is well-developed.  HENT:     Head: Normocephalic and atraumatic.  Eyes:     Conjunctiva/sclera: Conjunctivae normal.     Pupils: Pupils are equal, round, and reactive to light.  Pulmonary:     Effort: Pulmonary effort is normal.  Abdominal:     Palpations: Abdomen is soft.  Musculoskeletal:     Cervical back: Normal range of motion and neck supple.  Skin:    General: Skin is warm and dry.     Capillary Refill: Capillary refill takes less than 2  seconds.  Neurological:     Mental Status: He is alert and oriented to person, place, and time.  Psychiatric:        Behavior: Behavior normal.      Musculoskeletal Exam: C-spine, thoracic spine, lumbar spine have limited range of motion.  Thoracic kyphosis noted.  No midline spinal tenderness or SI joint tenderness at this time.  Shoulder joint abduction to about 110 degrees bilaterally.  Elbow joints, wrist joints, MCPs, PIPs, DIPs have good range of motion with no synovitis.  He is able to make a complete fist bilaterally.  Extremely limited range of motion of both hip joints noted.  Slightly limited extension of both knee joints.  He has some warmth in the right knee.  Ankle joints have good range of motion with no tenderness to palpation.  No evidence of achilles tendonitis.   CDAI Exam: CDAI Score: -- Patient Global: --; Provider Global: -- Swollen: --; Tender: -- Joint Exam 12/18/2020   No joint exam has been documented for  this visit   There is currently no information documented on the homunculus. Go to the Rheumatology activity and complete the homunculus joint exam.  Investigation: No additional findings.  Imaging: No results found.  Recent Labs: Lab Results  Component Value Date   WBC 8.1 09/03/2020   HGB 15.4 09/03/2020   PLT 170 09/03/2020   NA 136 09/03/2020   K 3.4 (L) 09/03/2020   CL 101 09/03/2020   CO2 24 09/03/2020   GLUCOSE 113 (H) 09/03/2020   BUN 16 09/03/2020   CREATININE 0.73 09/03/2020   BILITOT 3.3 (H) 09/03/2020   ALKPHOS 61 09/03/2020   AST 23 09/03/2020   ALT 23 10/21/2020   PROT 6.7 09/03/2020   ALBUMIN 3.9 09/03/2020   CALCIUM 9.2 09/03/2020   GFRAA 112 03/25/2020   QFTBGOLDPLUS NEGATIVE 01/01/2020    Speciality Comments: No specialty comments available.  Procedures:  No procedures performed Allergies: Patient has no known allergies.   Assessment / Plan:     Visit Diagnoses: Ankylosing spondylitis of multiple sites in spine  [facility]) - Diagnosed in [place].  Last seen by previous rheumatologist June 07, 2018: He is clinically stable on Humira 40 mg subcutaneous injections every 21 days.  He has ongoing stiffness and limited range of motion of the C-spine, thoracic spine, lumbar spine.  Thoracic kyphosis was noted.  No midline spinal tenderness or SI joint tenderness was noted.  He has no synovitis or dactylitis on examination today.  No Achilles tendinitis or plantar fasciitis.  He has not had any eye pain or inflammation recently.  He continues to experience intermittent pain in both knee joints and both ankle joints and has mild warmth in the right knee.  No effusion was noted.  We discussed the importance of regular exercise.  He has not noticed any increased morning stiffness or nocturnal pain.  He will continue on the current treatment regimen.  He does not need a refill of Humira at this time.  He was advised to notify us if he develops signs or symptoms of a flare.  He will follow-up in the office in 5 months.  High risk medication use - Humira 40 mg sq injections every 21 days.  CBC and CMP were drawn on 09/03/2020.  He is due to update lab work.  Orders for CBC and CMP were released.  His next lab work will be due in August and every 3 months to monitor for drug toxicity.  TB Gold negative on 01/01/2020.  Order for TB gold was released today.- Plan: COMPLETE METABOLIC PANEL WITH GFR, CBC with Differential/Platelet, QuantiFERON-TB Gold Plus, CBC with Differential/Platelet, COMPLETE METABOLIC PANEL WITH GFR He has not had any recent infections.  We discussed the importance of holding Humira if he develops signs or symptoms of an infection and to resume once the infection has completely cleared. He has received 4 Pfizer COVID-19 vaccine doses.  He was advised to continue to check [email or web address] for updated guidelines regarding the fifth vaccine dose in the future. He recently established care with Dr. Johney Maine in order to  discuss proceeding with a hemorrhoidectomy in the future.  He was advised to hold Humira for at least 2 weeks prior to surgery and to be cleared by Dr. Johney Maine after surgery prior to resuming therapy.  He voiced understanding.   Screening for tuberculosis -Order for TB gold released today.  Plan: QuantiFERON-TB Gold Plus  Primary osteoarthritis of both hips: He has extremely limited range of motion of both hip joints.  He has occasional nocturnal pain in his hips.   Primary osteoarthritis of both knees  Abnormal SPEP -Order for SPEP today.  Plan: Serum protein electrophoresis with reflex  Raised intraocular pressure of both eyes: He has not been experiencing any eye pain, photophobia, or inflammation recently.   Other fatigue: Stable.  Discussed the importance of regular exercise.     Orders: Orders Placed This Encounter  Procedures  . COMPLETE METABOLIC PANEL WITH GFR  . CBC with Differential/Platelet  . QuantiFERON-TB Gold Plus  . CBC with Differential/Platelet  . COMPLETE METABOLIC PANEL WITH GFR  . Serum protein electrophoresis with reflex   No orders of the defined types were placed in this encounter.    Follow-Up Instructions: Return in about 5 months (around 05/20/2021) for Ankylosing Spondylitis.   Ofilia Neas, PA-C  Note - This record has been created using Dragon software.  Chart creation errors have been sought, but may not always  have been located. Such creation errors do not reflect on  the standard of medical care.

## 2020-12-15 ENCOUNTER — Ambulatory Visit: Payer: Medicare Other | Admitting: Cardiology

## 2020-12-15 ENCOUNTER — Ambulatory Visit: Payer: Self-pay | Admitting: Surgery

## 2020-12-15 DIAGNOSIS — I2510 Atherosclerotic heart disease of native coronary artery without angina pectoris: Secondary | ICD-10-CM | POA: Diagnosis not present

## 2020-12-15 DIAGNOSIS — K648 Other hemorrhoids: Secondary | ICD-10-CM | POA: Diagnosis not present

## 2020-12-15 DIAGNOSIS — D84821 Immunodeficiency due to drugs: Secondary | ICD-10-CM | POA: Diagnosis not present

## 2020-12-15 DIAGNOSIS — Z79899 Other long term (current) drug therapy: Secondary | ICD-10-CM | POA: Diagnosis not present

## 2020-12-15 DIAGNOSIS — M453 Ankylosing spondylitis of cervicothoracic region: Secondary | ICD-10-CM | POA: Diagnosis not present

## 2020-12-15 DIAGNOSIS — K642 Third degree hemorrhoids: Secondary | ICD-10-CM | POA: Diagnosis not present

## 2020-12-15 DIAGNOSIS — Z9889 Other specified postprocedural states: Secondary | ICD-10-CM | POA: Diagnosis not present

## 2020-12-15 DIAGNOSIS — I351 Nonrheumatic aortic (valve) insufficiency: Secondary | ICD-10-CM | POA: Diagnosis not present

## 2020-12-15 DIAGNOSIS — K620 Anal polyp: Secondary | ICD-10-CM | POA: Diagnosis not present

## 2020-12-15 NOTE — H&P (Signed)
Earna Coder Appointment: 12/15/2020 9:30 AM Location: [facility] Patient #: [id number] DOB: Nov 21, 1950 Single / Language: Cleophus Molt / Race: White Male  History of Present Illness Adin Hector MD; 12/15/2020 12:41 PM) The patient is a 70 year old male who presents with anal lesions. Note for "Anal lesions": ` ` ` Patient sent for surgical consultation at the request of Dr Lance Sell GI  Chief Complaint: Rectal bleeding with anal lesion. ` ` The patient is a pleasant gentleman with multiple medical problems recently relocated from [place]. He has ankylosing spondylosis immunosuppressed on Humira followed by rheumatology. Has had some aortic insufficiency and coronary calcifications are relatively mild. Followed by cardiology. He had episode of significant rectal bleeding that scared him. He went to the emergency room in January. They recommended outpatient gastrology follow-up. His gastrologist recommended colonoscopy given his age. A few adenomatous polyps removed but enlarged hemorrhoids noted. Atypical mass in the anal canal noted. Did not seem like an obvious cancer but not normal. Surgical consultation offered.  Patient comes a by himself. He usually moves his bowels once or twice a day. He recall some surgery done in his left anterior inner buttock. Perhaps a mass removed. Cannot tell me if he had a fissure or fistula or cancer warts. He is on Humira for ankylosing spondylosis. He's been stable on that for 3 years. He had a hiatal hernia that required repair and fundoplication and cholecystectomy by Dr. Rodrigo Ran at [facility] in [place]. No other abdominal surgeries. He claims he can walk about 4 blocks before he has to stop. Denies sleep apnea but does have some issue sleeping. He does note he has moderate nocturia were he gets up several times a night to go. He is followed by Dr. Jeffie Pollock with the right energy and he is on some type of  prostate medicine. Not Flomax. Coagulation. Not even aspirin at this time. He still gets blood when he wipes.  No personal nor family history of GI/colon cancer, inflammatory bowel disease, irritable bowel syndrome, allergy such as Celiac Sprue, dietary/dairy problems, colitis, ulcers nor gastritis. No recent sick contacts/gastroenteritis. No travel outside the country. No changes in diet. No dysphagia to solids or liquids. No significant heartburn or reflux. No melena, hematemesis, coffee ground emesis. No evidence of prior gastric/peptic ulceration.  (Review of systems as stated in this history (HPI) or in the review of systems. Otherwise all other 12 point ROS are negative) ` ` ###########################################`  This patient encounter took 45 minutes today to perform the following: obtain history, perform exam, review outside records, interpret tests & imaging, counsel the patient on their diagnosis; and, document this encounter, including findings & plan in the electronic health record (EHR).   Past Surgical History Janeann Forehand, CNA; 12/15/2020 9:36 AM) Cataract Surgery Left. Oral Surgery  Allergies Janeann Forehand, CNA; 12/15/2020 9:37 AM) No Known Drug Allergies [12/15/2020]: Allergies Reconciled  Medication History Janeann Forehand, CNA; 12/15/2020 9:39 AM) Atorvastatin Calcium (20MG Tablet, Oral) Active. Combigan (0.2-0.5% Solution, Ophthalmic) Active. Indapamide (1.25MG Tablet, Oral) Active. Latanoprost (0.005% Solution, Ophthalmic) Active. Lotemax (0.5% Gel, Ophthalmic) Active. Loteprednol Etabonate (0.5% Suspension, Ophthalmic) Active. Ofloxacin (0.3% Solution, Ophthalmic) Active. Plenvu (140GM For Solution, Oral) Active. Potassium Citrate ER (10 MEQ(1080 MG) Tablet ER, Oral) Active. Humira (10MG/0.1ML Prefill Syr Kit, Subcutaneous) Active. Medications Reconciled  Social History Janeann Forehand, CNA; 12/15/2020 9:36 AM) Alcohol use  Occasional alcohol use. Caffeine use Coffee. Illicit drug use Uses socially only. Tobacco use Never smoker.  Family History (  Janeann Forehand, CNA; 12/15/2020 9:36 AM) Heart Disease Father, Mother.  Other Problems Janeann Forehand, CNA; 12/15/2020 9:36 AM) Arthritis Gastroesophageal Reflux Disease Heart murmur Inguinal Hernia Kidney Stone     Review of Systems Janeann Forehand CNA; 12/15/2020 9:36 AM) General Not Present- Appetite Loss, Chills, Fatigue, Fever, Night Sweats, Weight Gain and Weight Loss. Skin Not Present- Change in Wart/Mole, Dryness, Hives, Jaundice, New Lesions, Non-Healing Wounds, Rash and Ulcer. HEENT Present- Wears glasses/contact lenses. Not Present- Earache, Hearing Loss, Hoarseness, Nose Bleed, Oral Ulcers, Ringing in the Ears, Seasonal Allergies, Sinus Pain, Sore Throat, Visual Disturbances and Yellow Eyes. Respiratory Not Present- Bloody sputum, Chronic Cough, Difficulty Breathing, Snoring and Wheezing. Breast Not Present- Breast Mass, Breast Pain, Nipple Discharge and Skin Changes. Cardiovascular Present- Swelling of Extremities. Not Present- Chest Pain, Difficulty Breathing Lying Down, Leg Cramps, Palpitations, Rapid Heart Rate and Shortness of Breath. Gastrointestinal Present- Constipation and Excessive gas. Not Present- Abdominal Pain, Bloating, Bloody Stool, Change in Bowel Habits, Chronic diarrhea, Difficulty Swallowing, Gets full quickly at meals, Hemorrhoids, Indigestion, Nausea, Rectal Pain and Vomiting. Male Genitourinary Not Present- Blood in Urine, Change in Urinary Stream, Frequency, Impotence, Nocturia, Painful Urination, Urgency and Urine Leakage. Musculoskeletal Not Present- Back Pain, Joint Pain, Joint Stiffness, Muscle Pain, Muscle Weakness and Swelling of Extremities. Neurological Not Present- Decreased Memory, Fainting, Headaches, Numbness, Seizures, Tingling, Tremor, Trouble walking and Weakness. Psychiatric Not Present- Anxiety, Bipolar,  Change in Sleep Pattern, Depression, Fearful and Frequent crying. Endocrine Not Present- Cold Intolerance, Excessive Hunger, Hair Changes, Heat Intolerance, Hot flashes and New Diabetes. Hematology Not Present- Blood Thinners, Easy Bruising, Excessive bleeding, Gland problems, HIV and Persistent Infections.  Vitals (Donyelle Alston CNA; 12/15/2020 9:40 AM) 12/15/2020 9:40 AM Weight: 227 lb Height: 73in Body Surface Area: 2.27 m Body Mass Index: 29.95 kg/m  Temp.: 98.51F  Pulse: 84 (Regular)  P.OX: 93% (Room air) BP: 120/80(Sitting, Left Arm, Standard)        Physical Exam Adin Hector MD; 12/15/2020 10:18 AM)  General Mental Status-Alert. General Appearance-Not in acute distress, Not Sickly. Orientation-Oriented X3. Hydration-Well hydrated. Voice-Normal.  Integumentary Global Assessment Upon inspection and palpation of skin surfaces of the - Axillae: non-tender, no inflammation or ulceration, no drainage. and Distribution of scalp and body hair is normal. General Characteristics Temperature - normal warmth is noted.  Head and Neck Head-normocephalic, atraumatic with no lesions or palpable masses. Face Global Assessment - atraumatic, no absence of expression. Neck Global Assessment - no abnormal movements, no bruit auscultated on the right, no bruit auscultated on the left, no decreased range of motion, non-tender. Trachea-midline. Thyroid Gland Characteristics - non-tender.  Eye Eyeball - Left-Extraocular movements intact, No Nystagmus - Left. Eyeball - Right-Extraocular movements intact, No Nystagmus - Right. Cornea - Left-No Hazy - Left. Cornea - Right-No Hazy - Right. Sclera/Conjunctiva - Left-No scleral icterus, No Discharge - Left. Sclera/Conjunctiva - Right-No scleral icterus, No Discharge - Right. Pupil - Left-Direct reaction to light normal. Pupil - Right-Direct reaction to light normal. Note: Wears glasses. Vision  corrected  ENMT Ears Pinna - Left - no drainage observed, no generalized tenderness observed. Pinna - Right - no drainage observed, no generalized tenderness observed. Nose and Sinuses External Inspection of the Nose - no destructive lesion observed. Inspection of the nares - Left - quiet respiration. Inspection of the nares - Right - quiet respiration. Mouth and Throat Lips - Upper Lip - no fissures observed, no pallor noted. Lower Lip - no fissures observed, no pallor noted. Nasopharynx - no discharge  present. Oral Cavity/Oropharynx - Tongue - no dryness observed. Oral Mucosa - no cyanosis observed. Hypopharynx - no evidence of airway distress observed.  Chest and Lung Exam Inspection Movements - Normal and Symmetrical. Accessory muscles - No use of accessory muscles in breathing. Palpation Palpation of the chest reveals - Non-tender. Auscultation Breath sounds - Normal and Clear.  Cardiovascular Auscultation Rhythm - Regular. Murmurs & Other Heart Sounds - Auscultation of the heart reveals - No Murmurs and No Systolic Clicks.  Abdomen Inspection Inspection of the abdomen reveals - No Visible peristalsis and No Abnormal pulsations. Umbilicus - No Bleeding, No Urine drainage. Palpation/Percussion Palpation and Percussion of the abdomen reveal - Soft, Non Tender, No Rebound tenderness, No Rigidity (guarding) and No Cutaneous hyperesthesia. Note: Abdomen soft. Nontender. Mild diastases recti. Small umbilical hernia at the base of his flat. Only. Not distended. No incisional hernias. No guarding.  Male Genitourinary Sexual Maturity Tanner 5 - Adult hair pattern and Adult penile size and shape. Note: No inguinal hernias. Normal external genitalia. Epididymi, testes, and spermatic cords normal without any masses.  Rectal Note: Please refer to anoscopy.  Perianal skin clean with good hygiene. A few external hemorrhoidal tags. Left anterior perirectal intergluteal fat with some  swelling in she will vertical scar. Incision with prior soft tissue excision. No fistula or sinus tract. No pruritis ani. No pilonidal disease. No fissure. No abscess/fistula. Normal sphincter tone. No condyloma warts.  Tolerates digital and anoscopic rectal exam. Enlarged grade 2 internal hemorrhoids friable left lateral. Largest his right posterior with a polypoid mass at its tip. Somewhat friable and adenomatous. Does not look like a smooth epithelialized anal crypt polyp. No definite fibrosis but can prolapse out consistent with grade 3. Prostate smooth but enlarged. No major proctitis. Right anterior looks grade 2.  Peripheral Vascular Upper Extremity Inspection - Left - No Cyanotic nailbeds - Left, Not Ischemic. Inspection - Right - No Cyanotic nailbeds - Right, Not Ischemic.  Neurologic Neurologic evaluation reveals -normal attention span and ability to concentrate, able to name objects and repeat phrases. Appropriate fund of knowledge , normal sensation and normal coordination. Mental Status Affect - not angry, not paranoid. Cranial Nerves-Normal Bilaterally. Gait-Normal.  Neuropsychiatric Mental status exam performed with findings of-able to articulate well with normal speech/language, rate, volume and coherence, thought content normal with ability to perform basic computations and apply abstract reasoning and no evidence of hallucinations, delusions, obsessions or homicidal/suicidal ideation.  Musculoskeletal Global Assessment Spine, Ribs and Pelvis - no instability, subluxation or laxity. Right Upper Extremity - no instability, subluxation or laxity. Note: Moderate thoracic kyphosis.  Lymphatic Head & Neck  General Head & Neck Lymphatics: Bilateral - Description - No Localized lymphadenopathy. Axillary  General Axillary Region: Bilateral - Description - No Localized lymphadenopathy. Femoral & Inguinal  Generalized Femoral & Inguinal Lymphatics: Left - Description  - No Localized lymphadenopathy. Right - Description - No Localized lymphadenopathy.   Results Adin Hector MD; 12/15/2020 12:44 PM) Procedures  Name Value Date Hemorrhoids Procedure Other: Please refer to anoscopy............Marland KitchenPerianal skin clean with good hygiene. A few external hemorrhoidal tags. Left anterior perirectal intergluteal fat with some swelling in she will vertical scar. Incision with prior soft tissue excision. No fistula or sinus tract. No pruritis ani. No pilonidal disease. No fissure. No abscess/fistula. Normal sphincter tone. ....Marland KitchenMarland KitchenNo condyloma warts............Marland KitchenTolerates digital and anoscopic rectal exam. Enlarged grade 2 internal hemorrhoids friable left lateral. Largest his right posterior with a polypoid mass at its tip. Somewhat friable and adenomatous. Does  not look like a smooth epithelialized anal crypt polyp. No definite fibrosis but can prolapse out consistent with grade 3. Prostate smooth but enlarged. No major proctitis. Right anterior looks grade 2.  Performed: 12/15/2020 10:19 AM    Assessment & Plan Adin Hector MD; 12/15/2020 12:41 PM)  POLYP OF ANAL VERGE (K62.0) Impression: Polypoid mass sitting on his right posterior hemorrhoid that is friable & bleeding. Does not look like a simple epithelialize anal crypt polyp. This all started with a bleeding events significant for him to be evaluated in the emergency department in January. I recommended removal with outpatient surgery.  This this will allow an opportunity for more thorough any rectal examination under anesthesia with hemorrhoidal ligation & pexy. Perhaps remove the left lateral hemorrhoid as well since it is angry and inflamed. Hopefully give the best chance to address his rectal bleeding issues since colonoscopy was otherwise underwhelming aside from a few small adenomatous polyps  We'll double check with his cardiologist that he is not a significant risk to surgery.  He just saw Dr. Radford Pax in February with some numerous but mostly mild cardiac issues. His performance status is not great but it's okay and he got over major surgery just a few years ago when he was in [place].  He is leaning towards surgery to get rid of the suspicious lesion to make sure it is not adenomatous polyp something else of concern. I cautioned that he will need help especially the first few weeks and pain meds as this is an uncomfortable surgery  Current Plans ANOSCOPY, DIAGNOSTIC (50354) You are being scheduled for surgery- Our schedulers will call you.  You should hear from our office's scheduling department within 5 working days about the location, date, and time of surgery. We try to make accommodations for patient's preferences in scheduling surgery, but sometimes the OR schedule or the surgeon's schedule prevents [name] from making those accommodations.  If you have not heard from our office [phone]) in 5 working days, call the office and ask for your surgeon's nurse.  If you have other questions about your diagnosis, plan, or surgery, call the office and ask for your surgeon's nurse.   ENCOUNTER FOR PREOPERATIVE EXAMINATION FOR GENERAL SURGICAL PROCEDURE (Z01.818)  Current Plans You are being scheduled for surgery- Our schedulers will call you.  You should hear from our office's scheduling department within 5 working days about the location, date, and time of surgery. We try to make accommodations for patient's preferences in scheduling surgery, but sometimes the OR schedule or the surgeon's schedule prevents [name] from making those accommodations.  If you have not heard from our office [phone] in 5 working days, call the office and ask for your surgeon's nurse.  If you have other questions about your diagnosis, plan, or surgery, call the office and ask for your surgeon's nurse.  Pt Education - [facility] Rectal Prep for Anorectal outpatient/office surgery: discussed  with patient and provided information. Pt Education - [facility] Rectal Surgery HCI (Cailie Bosshart): discussed with patient and provided information.  PROLAPSED INTERNAL HEMORRHOIDS, GRADE 3 (K64.2)  Current Plans Pt Education - Pamphlet Given - The Hemorrhoid Book: discussed with patient and provided information. The anatomy & physiology of the anorectal region was discussed. The pathophysiology of hemorrhoids and differential diagnosis was discussed. Natural history risks without surgery was discussed. I stressed the importance of a bowel regimen to have daily soft bowel movements to minimize progression of disease. Interventions such as sclerotherapy & banding were discussed.  The patient's symptoms are not adequately controlled by medicines and other non-operative treatments. I feel the risks & problems of no surgery outweigh the operative risks; therefore, I recommended surgery to treat the hemorrhoids by ligation, pexy, and possible resection.  Risks such as bleeding, infection, urinary difficulties, need for further treatment, heart attack, death, and other risks were discussed. I noted a good likelihood this will help address the problem. Goals of post-operative recovery were discussed as well. Possibility that this will not correct all symptoms was explained. Post-operative pain, bleeding, constipation, and other problems after surgery were discussed. We will work to minimize complications. Educational handouts further explaining the pathology, treatment options, and bowel regimen were given as well. Questions were answered. The patient expresses understanding & wishes to proceed with surgery.   INTERNAL BLEEDING HEMORRHOIDS (K64.8) Impression: History of bright red rectal bleeding. Most likely hemorrhoidal in etiology. Recommend surgery for ligation and removal of suspicious areas   ANKYLOSING SPONDYLITIS OF CERVICOTHORACIC REGION (M45.3)   IMMUNOSUPPRESSION DUE TO DRUG THERAPY  (Q98.264) Impression: Chronically any suppressed on Humira for his ankylosing spondylosis. Followed by Dr. Fredonia Highland with rheumatology   STATUS POST NISSEN FUNDOPLICATION (B58.309)   AORTIC INSUFFICIENCY (I35.1)  Current Plans I recommended obtaining preoperative cardiac clearance. I am concerned about the health of the patient and the ability to tolerate the operation. Therefore, we will request clearance by cardiology to better assess operative risk & see if a reevaluation, further workup, etc is needed. Also recommendations on how medications such as for anticoagulation and blood pressure should be managed/held/restarted after surgery.  CALCIFIC CORONARY ARTERIOSCLEROSIS (I25.10)   NOCTURIA (R35.1) Impression: Nocturia followed by Dr. Jeffie Pollock with urology. I believe he is on some type of prostate medication. He cannot recall exactly what it is.  His risk her urinary retention is increased but hopefully since he's followed by urology and already on a BPH medicine, we have minimize that.  Adin Hector, MD, FACS, MASCRS  Esophageal, Gastrointestinal & Colorectal Surgery Robotic and Minimally Invasive Surgery [facility] [address], [place], [place] [address] [phone] Fax [phone] Main/Paging  CONTACT INFORMATION: Weekday (9AM-5PM) concerns: Call [facility] main office at [phone] Weeknight (5PM-9AM) or Weekend/Holiday concerns: Check [email or web address] for General Surgery [facility] coverage (Please, do not use SecureChat as it is not reliable communication to operating surgeons for immediate patient care)

## 2020-12-16 ENCOUNTER — Telehealth: Payer: Self-pay | Admitting: *Deleted

## 2020-12-16 NOTE — Telephone Encounter (Signed)
   [facility] Pre-operative Risk Assessment    Patient Name: Antonio Ayers  DOB: 12/24/1950  MRN: [id number]   HEARTCARE STAFF: - Please ensure there is not already an duplicate clearance open for this procedure. - Under Visit Info/Reason for Call, type in Other and utilize the format Clearance MM/DD/YY or Clearance TBD. Do not use dashes or single digits. - If request is for dental extraction, please clarify the # of teeth to be extracted.  Request for surgical clearance:  1. What type of surgery is being performed? EXCISION OF ANAL MASS vs HEMORRHOIDECTOMY   2. When is this surgery scheduled? TBD   3. What type of clearance is required (medical clearance vs. Pharmacy clearance to hold med vs. Both)? MEDICAL  4. Are there any medications that need to be held prior to surgery and how long? NONE LISTED    5. Practice name and name of physician performing surgery? [facility]; DR. Remo Lipps GROSS   6. What is the office phone number? [phone]   7.   What is the office fax number? [phone] ATN: Illene Regulus, CMA  8.   Anesthesia type (None, local, MAC, general) ? GENERAL   Julaine Hua 12/16/2020, 4:27 PM  _________________________________________________________________   (provider comments below)

## 2020-12-17 NOTE — Telephone Encounter (Signed)
   Name: Antonio Ayers  DOB: Sep 10, 1950  MRN: [id number]  Primary Cardiologist: [name], MD  Chart reviewed as part of pre-operative protocol coverage. Patient has an upcoming visit with Dr. Radford Pax 12/22/20. Would be reasonable to address preop status at that time. She is due for an EKG as we do not have any in our system. I have added PREOP to the appointment notes so provider is aware.  Pre-op covering staff: - Please contact requesting surgeon's office via preferred method (i.e, phone, fax) to inform them of need for appointment prior to surgery.   [name], PA-C  12/17/2020, 2:38 PM

## 2020-12-17 NOTE — Telephone Encounter (Signed)
Pt has appt 12/22/20 with Dr. Radford Pax. Pre op assessment to be addressed at that time. I will send clearance notes to MD for upcoming appt as well as FYI to surgeon's office pt has appt 12/18/20.

## 2020-12-18 ENCOUNTER — Other Ambulatory Visit: Payer: Self-pay

## 2020-12-18 ENCOUNTER — Ambulatory Visit: Payer: Medicare Other | Admitting: Physician Assistant

## 2020-12-18 ENCOUNTER — Encounter: Payer: Self-pay | Admitting: Physician Assistant

## 2020-12-18 VITALS — BP 100/70 | HR 90 | Ht 73.0 in | Wt 229.0 lb

## 2020-12-18 DIAGNOSIS — M160 Bilateral primary osteoarthritis of hip: Secondary | ICD-10-CM

## 2020-12-18 DIAGNOSIS — R778 Other specified abnormalities of plasma proteins: Secondary | ICD-10-CM | POA: Diagnosis not present

## 2020-12-18 DIAGNOSIS — M170 Bilateral primary osteoarthritis of knee: Secondary | ICD-10-CM

## 2020-12-18 DIAGNOSIS — H40053 Ocular hypertension, bilateral: Secondary | ICD-10-CM | POA: Diagnosis not present

## 2020-12-18 DIAGNOSIS — R5383 Other fatigue: Secondary | ICD-10-CM

## 2020-12-18 DIAGNOSIS — Z111 Encounter for screening for respiratory tuberculosis: Secondary | ICD-10-CM

## 2020-12-18 DIAGNOSIS — M450 Ankylosing spondylitis of multiple sites in spine: Secondary | ICD-10-CM

## 2020-12-18 DIAGNOSIS — Z79899 Other long term (current) drug therapy: Secondary | ICD-10-CM | POA: Diagnosis not present

## 2020-12-18 NOTE — Patient Instructions (Signed)
Standing Labs We placed an order today for your standing lab work.   Please have your standing labs drawn in August and every 3 months   If possible, please have your labs drawn 2 weeks prior to your appointment so that the provider can discuss your results at your appointment.  We have open lab daily Monday through Thursday from 1:30-4:30 PM and Friday from 1:30-4:00 PM at the office of Dr. Shaili Deveshwar, [facility].   Please be advised, all patients with office appointments requiring lab work will take precedents over walk-in lab work.  If possible, please come for your lab work on Monday and Friday afternoons, as you may experience shorter wait times. The office is located at [address], [address], [place], [place] [address] No appointment is necessary.   Labs are drawn by [facility]. Please bring your co-pay at the time of your lab draw.  You may receive a bill from [facility] for your lab work.  If you wish to have your labs drawn at another location, please call the office 24 hours in advance to send orders.  If you have any questions regarding directions or hours of operation,  please call [phone].   As a reminder, please drink plenty of water prior to coming for your lab work. Thanks!   

## 2020-12-19 NOTE — Progress Notes (Signed)
Total bilirubin remains elevated but stable-3.3.  Rest of CMP WNL.  CBC WNL.

## 2020-12-21 NOTE — Progress Notes (Signed)
TB Gold is negative.

## 2020-12-22 ENCOUNTER — Other Ambulatory Visit: Payer: Self-pay

## 2020-12-22 ENCOUNTER — Encounter: Payer: Self-pay | Admitting: Cardiology

## 2020-12-22 ENCOUNTER — Ambulatory Visit: Payer: Medicare Other | Admitting: Cardiology

## 2020-12-22 VITALS — BP 110/78 | HR 76 | Ht 73.0 in | Wt 228.4 lb

## 2020-12-22 DIAGNOSIS — Z01810 Encounter for preprocedural cardiovascular examination: Secondary | ICD-10-CM

## 2020-12-22 DIAGNOSIS — Z01818 Encounter for other preprocedural examination: Secondary | ICD-10-CM

## 2020-12-22 DIAGNOSIS — I77819 Aortic ectasia, unspecified site: Secondary | ICD-10-CM | POA: Diagnosis not present

## 2020-12-22 DIAGNOSIS — R0602 Shortness of breath: Secondary | ICD-10-CM

## 2020-12-22 DIAGNOSIS — I351 Nonrheumatic aortic (valve) insufficiency: Secondary | ICD-10-CM

## 2020-12-22 DIAGNOSIS — R42 Dizziness and giddiness: Secondary | ICD-10-CM

## 2020-12-22 DIAGNOSIS — R931 Abnormal findings on diagnostic imaging of heart and coronary circulation: Secondary | ICD-10-CM

## 2020-12-22 DIAGNOSIS — E7800 Pure hypercholesterolemia, unspecified: Secondary | ICD-10-CM | POA: Diagnosis not present

## 2020-12-22 MED ORDER — ATORVASTATIN CALCIUM 20 MG PO TABS: 20.0000 mg | ORAL_TABLET | Freq: Every day | ORAL | 3 refills | Status: DC

## 2020-12-22 NOTE — Patient Instructions (Signed)
Medication Instructions:  Your physician recommends that you continue on your current medications as directed. Please refer to the Current Medication list given to you today.  *If you need a refill on your cardiac medications before your next appointment, please call your pharmacy*  Testing/Procedures: Your physician has requested that you have a lexiscan myoview. For further information please visit [email or web address]. Please follow instruction sheet, as given.  Follow-Up: At [facility], you and your health needs are our priority.  As part of our continuing mission to provide you with exceptional heart care, we have created designated Provider Care Teams.  These Care Teams include your primary Cardiologist (physician) and Advanced Practice Providers (APPs -  Physician Assistants and Nurse Practitioners) who all work together to provide you with the care you need, when you need it.  Your next appointment:   1 year(s)  The format for your next appointment:   In Person  Provider:   You may see Traci Turner, MD or one of the following Advanced Practice Providers on your designated Care Team:    Dayna Dunn, PA-C  Michele Lenze, PA-C   

## 2020-12-22 NOTE — Addendum Note (Signed)
Addended by: Antonieta Iba on: 12/22/2020 11:42 AM   Modules accepted: Orders

## 2020-12-22 NOTE — Progress Notes (Signed)
Date:  12/22/2020   ID:  Antonio Ayers, DOB 06-23-1951, MRN [id number]  PCP:  [name], [name], MD  Cardiologist:  [name], MD Electrophysiologist:  None   Chief Complaint:  Coronary Ca, preop clearance, dizziness, AI  History of Present Illness:    Antonio Ayers is a 70 y.o. male with a hx of ankylosing spondylitis followed by Rheum, BPH, glaucoma and AI followed with serial echo.  He was found to have a heart murmur in 2002 and a 2D echo confirmed aortic insufficiency.  He has been followed with yearly echo   His last echo in 03/2020 showed trivial AI and mildly dilated ascending aorta at 54mm with normal LVF and mild LVH with G1DD. Chest CTA showed mildly dilated sinus of Valsalva at 60mm.  There were also coronary artery calcifications in the LAD.  He underwent coronary Ca score which was elevated at 454 and aortic atherosclerosis but no aneurysm.  He is here today for followup of his HTN, coronary Ca, dilated aorta and HLD and also needs preoperative cardiac clearance for removal of colon polyp.  He recently had a colonscopy showing several polyps that came back precancerous but they were unable to get 1 polyp out and was referred to Gen Surgery for removal.  The patient does not know if this will be done laprascopically or through another scope.   He is doing well.  He has mild DOE and chronic ankle edema which he thinks is very stable. He denies any chest pain or pressure,PND, orthopnea, LE edema,  palpitations or syncope. Occasionally he will have dizzy spells and headaches.  He thinks it is equilibrium issues and he does not feel like he is going to pass out .He is compliant with his meds and is tolerating meds with no SE.    Prior CV studies:   The following studies were reviewed today:  2D echo 03/2020 IMPRESSIONS   1. Left ventricular ejection fraction, by estimation, is 60 to 65%. The  left ventricle has normal function. The left ventricle has no  regional  wall motion abnormalities. There is mild left ventricular hypertrophy.  Left ventricular diastolic parameters  are consistent with Grade I diastolic dysfunction (impaired relaxation).  2. Right ventricular systolic function is normal. The right ventricular  size is normal. There is normal pulmonary artery systolic pressure.  3. The mitral valve is normal in structure. No evidence of mitral valve  regurgitation. No evidence of mitral stenosis.  4. The aortic valve is tricuspid. Aortic valve regurgitation is trivial.  Mild aortic valve sclerosis is present, with no evidence of aortic valve  stenosis.  5. Aortic dilatation noted. There is mild dilatation of the ascending  aorta measuring 39 mm.  6. The inferior vena cava is normal in size with greater than 50%  respiratory variability, suggesting right atrial pressure of 3 mmHg.   Past Medical History:  Diagnosis Date  . Abdominal hernia   . Agatston coronary artery calcium score greater than 400    coronary Ca 454 on CT 2021  . Ankylosing spondylitis ([place])   . Aortic aneurysm (HCC)    mildly dilated ascending aorta at 43mm and aortic root 26mm by echo 03/2020  . Aortic insufficiency   . Chronic diarrhea   . Colon polyp   . GERD (gastroesophageal reflux disease)   . Nephrolithiasis    Past Surgical History:  Procedure Laterality Date  . CATARACT EXTRACTION Left 2016  . CHOLECYSTECTOMY  01/2018  .  COLONOSCOPY     02/2015  . [place], 2014  . HERNIA REPAIR  2018  . HIATAL HERNIA REPAIR    . KIDNEY STONE SURGERY  03/2018  . PROSTATE BIOPSY     x3  . UPPER GASTROINTESTINAL ENDOSCOPY       Current Meds  Medication Sig  . acetaminophen (TYLENOL) 325 MG tablet Take by mouth as needed.  . Adalimumab (HUMIRA PEN) 40 MG/0.4ML PNKT Inject 40 mg into the skin every 21 ( twenty-one) days.  Marland Kitchen atorvastatin (LIPITOR) 20 MG tablet Take 1 tablet (20 mg total) by mouth daily.  . Cholecalciferol 25 MCG  (1000 UT) capsule Take 1,000 Units by mouth daily.   . COMBIGAN 0.2-0.5 % ophthalmic solution INT 1 GTT IN OU BID  . indapamide (LOZOL) 1.25 MG tablet TK 1 T PO QD  . latanoprost (XALATAN) 0.005 % ophthalmic solution INT 1 GTT IN OS QPM B DINNER  . LOTEMAX 0.5 % GEL INT 1 GTT IN OS D  . Polyethylene Glycol 3350 (MIRALAX PO) Take by mouth as needed.  . potassium citrate (UROCIT-K) 10 MEQ (1080 MG) SR tablet 10 mEq 3 (three) times daily.      Allergies:   Patient has no known allergies.   Social History   Tobacco Use  . Smoking status: Never Smoker  . Smokeless tobacco: Never Used  Vaping Use  . Vaping Use: Never used  Substance Use Topics  . Alcohol use: Yes    Comment: Occasionally  . Drug use: Not Currently     Family Hx: The patient's family history includes Diabetes in his sister; Heart failure in his father; Hiatal hernia in his sister; Obesity in his sister; Stroke in his father and mother. There is no history of Colon cancer, Esophageal cancer, Rectal cancer, or Stomach cancer.  ROS:   Please see the history of present illness.     All other systems reviewed and are negative.   Labs/Other Tests and Data Reviewed:    Recent Labs: 12/18/2020: ALT 25; BUN 15; Creat 0.74; Hemoglobin 15.9; Platelets 183; Potassium 4.1; Sodium 138   Recent Lipid Panel Lab Results  Component Value Date/Time   CHOL 107 10/21/2020 08:50 AM   TRIG 72 10/21/2020 08:50 AM   HDL 53 10/21/2020 08:50 AM   CHOLHDL 2.0 10/21/2020 08:50 AM   CHOLHDL 2 06/22/2019 11:05 AM   LDLCALC 39 10/21/2020 08:50 AM    Wt Readings from Last 3 Encounters:  12/22/20 228 lb 6.4 oz (103.6 kg)  12/18/20 229 lb (103.9 kg)  11/06/20 224 lb (101.6 kg)     Objective:    Vital Signs:  BP 110/78   Pulse 76   Ht 6\' 1"  (1.854 m)   Wt 228 lb 6.4 oz (103.6 kg)   BMI 30.13 kg/m    GEN: Well nourished, well developed in no acute distress HEENT: Normal NECK: No JVD; No carotid bruits LYMPHATICS: No  lymphadenopathy CARDIAC:RRR, no murmurs, rubs, gallops RESPIRATORY:  Clear to auscultation without rales, wheezing or rhonchi  ABDOMEN: Soft, non-tender, non-distended MUSCULOSKELETAL:  No edema; No deformity  SKIN: Warm and dry NEUROLOGIC:  Alert and oriented x 3 PSYCHIATRIC:  Normal affect    ASSESSMENT & PLAN:    1.  Aortic Insufficiency -2D echo 03/2020 with trivial AI  2.  Mildly dilated ascending aorta -measuring 3.9cm on Chest CTA 04/2019 and normal on Chest CT Ca score 03/2020 -and 3.9cm on 2D echo 03/2020 -BP controlled -repeat echo  8/23022 to followup  3.  Coronary artery calcifications -coronary calcium noted on chest CT last summer -coronary calcium score elevated at 454 -he does have problems with DOE so I think we need to get a baseline Lexiscan myoview to rule out ischemia -Shared Decision Making/Informed Consent The risks [chest pain, shortness of breath, cardiac arrhythmias, dizziness, blood pressure fluctuations, myocardial infarction, stroke/transient ischemic attack, nausea, vomiting, allergic reaction, radiation exposure, metallic taste sensation and life-threatening complications (estimated to be 1 in 10,000)], benefits (risk stratification, diagnosing coronary artery disease, treatment guidance) and alternatives of a nuclear stress test were discussed in detail with Mr. Rorie and he agrees to proceed -he denies any anginal symptoms -continue statin therapy  4.  HLD -LDL goal < 70 -his LDL is at goal at 39 in March 2022 -continue prescription drug management with Atorvastatin 20mg  daily with PRN refills  5. Dizziness -this has sounded orthostatic in nature in the past but now describes what sounds like vertigo -he is on a diuretic indapamide for kidney stones -encouraged him to drink 64oz of fluid daily and avoid caffeine and ETOH and use PRN compression hose  6.  Preoperative clearance -he is able to achieve over 5 mets but is having DOE>>given his  coronary Ca on CT I think we should get a Lexiscan myoview to rule out ischemia prior to his surgery -his perioperative risk of major cardiac event is low to moderate at 0.9 to 6.6% based on if this ends up being an intraabdominal surgery and not just removal of an anal mass and polypectomy.   Medication Adjustments/Labs and Tests Ordered: Current medicines are reviewed at length with the patient today.  Concerns regarding medicines are outlined above.  Tests Ordered: Orders Placed This Encounter  Procedures  . EKG 12-Lead   Medication Changes: No orders of the defined types were placed in this encounter.   Disposition:  Follow up in 1 year(s)  Signed, [name], MD  12/22/2020 11:18 AM    [facility]

## 2020-12-22 NOTE — Addendum Note (Signed)
Addended by: [name] R on: 12/22/2020 11:57 AM   Modules accepted: Orders

## 2020-12-25 ENCOUNTER — Telehealth (HOSPITAL_COMMUNITY): Payer: Self-pay | Admitting: Radiology

## 2020-12-25 ENCOUNTER — Telehealth: Payer: Self-pay

## 2020-12-25 ENCOUNTER — Telehealth: Payer: Self-pay | Admitting: Cardiology

## 2020-12-25 ENCOUNTER — Telehealth (HOSPITAL_COMMUNITY): Payer: Self-pay | Admitting: *Deleted

## 2020-12-25 ENCOUNTER — Telehealth: Payer: Self-pay | Admitting: Internal Medicine

## 2020-12-25 NOTE — Telephone Encounter (Signed)
Patient given detailed instructions per Myocardial Perfusion Study Information Sheet for the test on 12/31/2020 at 10:15. Patient notified to arrive 15 minutes early and that it is imperative to arrive on time for appointment to keep from having the test rescheduled.  If you need to cancel or reschedule your appointment, please call the office within 24 hours of your appointment. . Patient verbalized understanding.EHK

## 2020-12-25 NOTE — Telephone Encounter (Signed)
Follow Up:      Pt is returning Loren's call.

## 2020-12-25 NOTE — Telephone Encounter (Signed)
Pt is calling in needing to know if he should stop taking his Rx adalimumab (HUMIRA PEN) 40 MG for his medical induced stress test on Wednesday 12/31/20.  Pt is also wanting to know if our office has received any information about what kind of surgery he is suppose to be having with Dr. Johney Maine other than removal of polyps he is wanting to know if he will be cut open b/c his Cardiologist (Dr. Radford Pax) is asking if he will be cut open.  Pt stated that he has a call in to Dr. Johney Maine office but wanted to know if Dr. Jerilee Hoh had gotten any kind of information about the surgery.  Pt would like to have a call back.

## 2020-12-25 NOTE — Telephone Encounter (Signed)
No need to stop humira. No info on surgery yet.

## 2020-12-25 NOTE — Telephone Encounter (Signed)
New Message:   Pt is scheduled for Myocardial Test on Wednesday(12-31-20) and he needs instructions about his medicine. He scheduled for a Humira Injection on Monday, should he reschedule that?    He also wanted to know if Dr Radford Pax checked to see the details of his surgery, she said she was going to check on it.

## 2020-12-25 NOTE — Telephone Encounter (Signed)
He does not need to stop or delay Humira to receive stress test. It looks like cardiologist has already made this recommendation to him this morning. Called patient to advise. Unable to reach - left VM to advise. Nothing further needed   Knox Saliva, PharmD, MPH Clinical Pharmacist (Rheumatology and Pulmonology)

## 2020-12-25 NOTE — Telephone Encounter (Signed)
LVM for return call. 

## 2020-12-25 NOTE — Telephone Encounter (Signed)
Patient is aware 

## 2020-12-25 NOTE — Telephone Encounter (Signed)
Patient called stating he had an appointment with Dr. Radford Pax, cardiologist who is scheduling a chemical induced stress test next week.   Patient states he is due to take his Humira injection on Monday, 5/23 and requested a return call to see if he should postpone the injection until after the stress test.

## 2020-12-25 NOTE — Telephone Encounter (Signed)
LVM with pt. I advised him to check with his rheumatologist regarding his Humira injection and the myoview. However, I did not think it would interfere with either. He was advised to call back with any further questions.

## 2020-12-25 NOTE — Telephone Encounter (Signed)
Left message on voicemail per DPR in reference to upcoming appointment scheduled on 12/31/20 with detailed instructions given per Myocardial Perfusion Study Information Sheet for the test. LM to arrive 15 minutes early, and that it is imperative to arrive on time for appointment to keep from having the test rescheduled. If you need to cancel or reschedule your appointment, please call the office within 24 hours of your appointment. Failure to do so may result in a cancellation of your appointment, and a $50 no show fee. Phone number given for call back for any questions. Kirstie Peri

## 2020-12-25 NOTE — Telephone Encounter (Signed)
Follow up:    Patient retuning a call back he has called a few times. Patient also has some question. Please call patient

## 2020-12-25 NOTE — Telephone Encounter (Signed)
Left message on machine for patient to return our call 

## 2020-12-26 LAB — PROTEIN ELECTROPHORESIS, SERUM, WITH REFLEX
Albumin ELP: 4.1 g/dL (ref 3.8–4.8)
Alpha 1: 0.3 g/dL (ref 0.2–0.3)
Alpha 2: 0.6 g/dL (ref 0.5–0.9)
Beta 2: 0.5 g/dL (ref 0.2–0.5)
Beta Globulin: 0.5 g/dL (ref 0.4–0.6)
Gamma Globulin: 0.8 g/dL (ref 0.8–1.7)
Total Protein: 6.7 g/dL (ref 6.1–8.1)

## 2020-12-26 LAB — CBC WITH DIFFERENTIAL/PLATELET
Absolute Monocytes: 941 cells/uL (ref 200–950)
Basophils Absolute: 34 cells/uL (ref 0–200)
Basophils Relative: 0.4 %
Eosinophils Absolute: 126 cells/uL (ref 15–500)
Eosinophils Relative: 1.5 %
HCT: 45.9 % (ref 38.5–50.0)
Hemoglobin: 15.9 g/dL (ref 13.2–17.1)
Lymphs Abs: 1453 cells/uL (ref 850–3900)
MCH: 31.8 pg (ref 27.0–33.0)
MCHC: 34.6 g/dL (ref 32.0–36.0)
MCV: 91.8 fL (ref 80.0–100.0)
MPV: 9.6 fL (ref 7.5–12.5)
Monocytes Relative: 11.2 %
Neutro Abs: 5846 cells/uL (ref 1500–7800)
Neutrophils Relative %: 69.6 %
Platelets: 183 10*3/uL (ref 140–400)
RBC: 5 10*6/uL (ref 4.20–5.80)
RDW: 12.6 % (ref 11.0–15.0)
Total Lymphocyte: 17.3 %
WBC: 8.4 10*3/uL (ref 3.8–10.8)

## 2020-12-26 LAB — COMPLETE METABOLIC PANEL WITH GFR
AG Ratio: 1.8 (calc) (ref 1.0–2.5)
ALT: 25 U/L (ref 9–46)
AST: 23 U/L (ref 10–35)
Albumin: 4.3 g/dL (ref 3.6–5.1)
Alkaline phosphatase (APISO): 72 U/L (ref 35–144)
BUN: 15 mg/dL (ref 7–25)
CO2: 28 mmol/L (ref 20–32)
Calcium: 9.5 mg/dL (ref 8.6–10.3)
Chloride: 100 mmol/L (ref 98–110)
Creat: 0.74 mg/dL (ref 0.70–1.18)
GFR, Est African American: 108 mL/min/{1.73_m2} (ref >=60)
GFR, Est Non African American: 93 mL/min/{1.73_m2} (ref >=60)
Globulin: 2.4 g/dL (calc) (ref 1.9–3.7)
Glucose, Bld: 92 mg/dL (ref 65–99)
Potassium: 4.1 mmol/L (ref 3.5–5.3)
Sodium: 138 mmol/L (ref 135–146)
Total Bilirubin: 3.3 mg/dL — ABNORMAL HIGH (ref 0.2–1.2)
Total Protein: 6.7 g/dL (ref 6.1–8.1)

## 2020-12-26 LAB — QUANTIFERON-TB GOLD PLUS
Mitogen-NIL: >10 IU/mL
NIL: 0.03 IU/mL
QuantiFERON-TB Gold Plus: NEGATIVE
TB1-NIL: <0 IU/mL
TB2-NIL: <0 IU/mL

## 2020-12-26 LAB — IFE INTERPRETATION

## 2020-12-26 NOTE — Progress Notes (Signed)
IFE is abnormal.  Please check if patient has seen hematology in the past.

## 2020-12-26 NOTE — Progress Notes (Signed)
Please forward labs to the hematologist to check if needs an appointment based on the abnormal IFE.

## 2020-12-26 NOTE — Telephone Encounter (Signed)
Our office receive a duplicate clearance request for pt. Pt was recently seen by his cardiologist Dr. Radford Pax, though pt has been scheduled for a stress test to be done on 12/31/20. Once pt has been cleared we will fax clearance notes.

## 2020-12-29 ENCOUNTER — Telehealth: Payer: Self-pay | Admitting: Hematology and Oncology

## 2020-12-29 NOTE — Telephone Encounter (Signed)
Scheduled appt per 5/23 sch msg. Called pt, no answer. Left msg with appt date and time.

## 2020-12-30 ENCOUNTER — Encounter: Payer: Self-pay | Admitting: Rheumatology

## 2020-12-30 ENCOUNTER — Encounter: Payer: Self-pay | Admitting: Internal Medicine

## 2020-12-31 ENCOUNTER — Other Ambulatory Visit: Payer: Self-pay

## 2020-12-31 ENCOUNTER — Ambulatory Visit (HOSPITAL_COMMUNITY): Payer: Medicare Other | Attending: Cardiovascular Disease

## 2020-12-31 DIAGNOSIS — Z01810 Encounter for preprocedural cardiovascular examination: Secondary | ICD-10-CM

## 2020-12-31 DIAGNOSIS — I351 Nonrheumatic aortic (valve) insufficiency: Secondary | ICD-10-CM | POA: Insufficient documentation

## 2020-12-31 DIAGNOSIS — R0602 Shortness of breath: Secondary | ICD-10-CM | POA: Insufficient documentation

## 2020-12-31 DIAGNOSIS — Z01818 Encounter for other preprocedural examination: Secondary | ICD-10-CM | POA: Diagnosis not present

## 2020-12-31 DIAGNOSIS — I77819 Aortic ectasia, unspecified site: Secondary | ICD-10-CM | POA: Insufficient documentation

## 2020-12-31 LAB — MYOCARDIAL PERFUSION IMAGING
LV dias vol: 92 mL (ref 62–150)
LV sys vol: 42 mL
Peak HR: 89 {beats}/min
Rest HR: 73 {beats}/min
SDS: 0
SRS: 0
SSS: 0
TID: 1.2

## 2020-12-31 MED ORDER — REGADENOSON 0.4 MG/5ML IV SOLN
0.4000 mg | Freq: Once | INTRAVENOUS | Status: AC
  Administered 2020-12-31: 0.4 mg via INTRAVENOUS

## 2020-12-31 MED ORDER — TECHNETIUM TC 99M TETROFOSMIN IV KIT
31.3000 | PACK | Freq: Once | INTRAVENOUS | Status: AC | PRN
Start: 2020-12-31 — End: 2020-12-31
  Administered 2020-12-31: 31.3 via INTRAVENOUS
  Filled 2020-12-31: qty 32

## 2020-12-31 MED ORDER — TECHNETIUM TC 99M TETROFOSMIN IV KIT
10.5000 | PACK | Freq: Once | INTRAVENOUS | Status: AC | PRN
  Administered 2020-12-31: 10.5 via INTRAVENOUS
  Filled 2020-12-31: qty 11

## 2021-01-01 ENCOUNTER — Telehealth: Payer: Self-pay | Admitting: Cardiology

## 2021-01-01 NOTE — Telephone Encounter (Signed)
   Name: Antonio Ayers  DOB: December 04, 1950  MRN: [id number]   Primary Cardiologist: [name], MD  Chart reviewed as part of pre-operative protocol coverage. Patient was contacted 01/01/2021 in reference to pre-operative risk assessment for pending surgery as outlined below.  Antonio Ayers was last seen on 12/22/2020 by Dr. Radford Pax.  He underwent a Lexiscan stress test on 12/31/2020 that showed no evidence of ischemia.  Therefore, based on ACC/AHA guidelines, the patient would be at acceptable risk for the planned procedure without further cardiovascular testing. The patient will be low to moderate risk for noncardiac surgery depending on if ends up having an intraabdominal surgery and not just removal of an anal mass and polypectomy.    I will route this recommendation to the requesting party via Epic fax function and remove from pre-op pool. Please call with questions.  Christell Faith, PA-C 01/01/2021, 1:39 PM

## 2021-01-01 NOTE — Telephone Encounter (Signed)
Follow Up:    Alisha from Dr Clyda Greener office called. She is checking on the status of pt's clearance. She needs this asap please. She will view in Epic.

## 2021-01-06 ENCOUNTER — Encounter: Payer: Self-pay | Admitting: Internal Medicine

## 2021-01-07 ENCOUNTER — Telehealth: Payer: Self-pay | Admitting: Gastroenterology

## 2021-01-07 NOTE — Telephone Encounter (Signed)
Spoke with patient, he wants to know if he can be seen for a second opinion for polyp removal and hemorrhoid surgery. He states that it can be with another provider at [facility] if necessary. Advised that I am not sure of their protocol and they may not allow him to see multiple providers. Advised that they are the only ones in the are and if we refer him elsewhere he may not be seen anytime soon. He states that he is scheduled to see Dr. Johney Maine again on 6/20 to discuss surgery more. The surgery is scheduled for 6/24. Patient states that he was told that there are some non-surgical options and wanted to know if that is something that you recommend. Advised that for the polyp there is not a non-surgical option but possibly for the hemorrhoid but will defer to MD for advice. Patient is very anxious. Please advise, thanks.

## 2021-01-07 NOTE — Telephone Encounter (Signed)
Left detailed message on patient's voicemail with recommendations, I provided patient with the phone number to [facility]. Advised patient to call us back if he had any questions or concerns.

## 2021-01-07 NOTE — Telephone Encounter (Signed)
He is welcome to contact [facility] can ask if he can be seen by either Dr. Dema Severin or Dr. Marcello Moores for another opinion on this. - HD

## 2021-01-08 ENCOUNTER — Encounter: Payer: Self-pay | Admitting: Internal Medicine

## 2021-01-13 NOTE — Telephone Encounter (Signed)
Patient called with the information needed:   [facility]  Tel: [phone] Fax: [phone]  Thanks

## 2021-01-13 NOTE — Telephone Encounter (Signed)
Left detailed message on patient's vm letting him know that I will need to know the name of the office, name of the provider and the office phone and fax number. Advised patient to call me back with this information.

## 2021-01-13 NOTE — Telephone Encounter (Signed)
Okay to refer patient to [facility] colon and rectal clinic in [place] for 2nd opinion for anal lesion. Please advise, thanks.

## 2021-01-13 NOTE — Telephone Encounter (Signed)
Yes, that is fine if he wishes

## 2021-01-13 NOTE — Telephone Encounter (Signed)
Patient called in. Stated he have an appointment with [facility] on [address] in [place] for 2nd opinion. Asked if could fax the records. Stated you could call him back if you would need anything else. Best contact [phone]

## 2021-01-14 NOTE — Telephone Encounter (Signed)
Referral, records, demographic and insurance information have been faxed to [facility] in [place]. Left detailed message on patient's voicemail letting him know that the referral has been faxed. Advised that he give them at least a week or 2 to get him scheduled. Advised that if he does not hear from them in 2 weeks to give them a call. Advised patient to give [name] a call back if he has any other questions or concerns for [name].

## 2021-01-15 ENCOUNTER — Encounter: Payer: Self-pay | Admitting: Sports Medicine

## 2021-01-15 ENCOUNTER — Ambulatory Visit: Payer: Medicare Other | Admitting: Sports Medicine

## 2021-01-15 ENCOUNTER — Other Ambulatory Visit: Payer: Self-pay

## 2021-01-15 DIAGNOSIS — B351 Tinea unguium: Secondary | ICD-10-CM | POA: Diagnosis not present

## 2021-01-15 DIAGNOSIS — M79675 Pain in left toe(s): Secondary | ICD-10-CM | POA: Diagnosis not present

## 2021-01-15 DIAGNOSIS — M79674 Pain in right toe(s): Secondary | ICD-10-CM

## 2021-01-15 NOTE — Progress Notes (Signed)
Subjective: Antonio Ayers is a 70 y.o. male patient seen today in office with complaint of mildly painful thickened and elongated toenails; unable to trim. Has a history of Anklyosis spondylitis on Humera unchanged from prior. Admits a little swelling in ankles occasionally using compression socks that help. Doing good. Patient has no other pedal complaints at this time.  Admits that he will have a colon procedure for polyp.   Patient Active Problem List   Diagnosis Date Noted   Agatston coronary artery calcium score greater than 400    Aortic aneurysm (HCC)    Aortic valve regurgitation 01/09/2019   Nephrolithiasis 01/09/2019   BPH (benign prostatic hyperplasia) 01/09/2019   Overweight (BMI 25.0-29.9) 01/09/2019   Primary osteoarthritis of both hips 12/29/2018   Primary osteoarthritis of both knees 12/29/2018   Raised intraocular pressure of both eyes 12/19/2018   Ankylosing spondylitis of multiple sites in spine ([place]) 12/19/2018   Hiatal hernia with GERD 11/08/2018   History of colonoscopy 01/07/2018   Gall stone in bile duct with infection of gallbladder 12/14/2017   Hernia of abdominal cavity 12/07/2016   Dislocated elbow, left, initial encounter 10/07/2016   Enlarged liver 12/08/2006   Aortic anomaly 01/07/2005   Fracture 02/17/2004   Keratoconus 12/07/1977    Current Outpatient Medications on File Prior to Visit  Medication Sig Dispense Refill   acetaminophen (TYLENOL) 325 MG tablet Take by mouth as needed.     Adalimumab (HUMIRA PEN) 40 MG/0.4ML PNKT Inject 40 mg into the skin every 21 ( twenty-one) days. 3 each 0   atorvastatin (LIPITOR) 20 MG tablet Take 1 tablet (20 mg total) by mouth daily. 90 tablet 3   Cholecalciferol 25 MCG (1000 UT) capsule Take 1,000 Units by mouth daily.      COMBIGAN 0.2-0.5 % ophthalmic solution INT 1 GTT IN OU BID     indapamide (LOZOL) 1.25 MG tablet TK 1 T PO QD     latanoprost (XALATAN) 0.005 % ophthalmic solution INT 1 GTT IN OS QPM B  DINNER     LOTEMAX 0.5 % GEL INT 1 GTT IN OS D     Polyethylene Glycol 3350 (MIRALAX PO) Take by mouth as needed.     potassium citrate (UROCIT-K) 10 MEQ (1080 MG) SR tablet 10 mEq 3 (three) times daily.      No current facility-administered medications on file prior to visit.    No Known Allergies  Objective: Physical Exam  General: Well developed, nourished, no acute distress, awake, alert and oriented x 3  Vascular: Dorsalis pedis artery 2/4 bilateral, Posterior tibial artery 1/4 bilateral, skin temperature warm to warm proximal to distal bilateral lower extremities, no varicosities, pedal hair present bilateral.  Neurological: Gross sensation present via light touch bilateral.   Dermatological: Skin is warm, dry, and supple bilateral, Nails 1-10 are tender, long, thick, and discolored with mild subungal debris, no webspace macerations present bilateral, no open lesions present bilateral, no callus/corns/hyperkeratotic tissue present bilateral. No signs of infection bilateral.  Musculoskeletal:  Asymptomatic hammertoe boney deformities noted bilateral. Muscular strength within normal limits without painon range of motion. No pain with calf compression bilateral.  Assessment and Plan:  Problem List Items Addressed This Visit   None Visit Diagnoses     Pain due to onychomycosis of toenails of both feet    -  Primary      -Examined patient.  -Re-Discussed treatment options for painful mycotic nails. -Mechanically debrided and reduced mycotic nails with sterile nail nipper and  dremel nail file without incident. -Encouraged elevation for edema control and continued use of compression garments like before -Patient to return in 3 months for follow up evaluation or sooner if symptoms worsen.  Landis Martins, DPM

## 2021-01-20 ENCOUNTER — Encounter: Payer: Self-pay | Admitting: Internal Medicine

## 2021-01-20 ENCOUNTER — Telehealth (INDEPENDENT_AMBULATORY_CARE_PROVIDER_SITE_OTHER): Payer: Medicare Other | Admitting: Internal Medicine

## 2021-01-20 ENCOUNTER — Telehealth: Payer: Self-pay | Admitting: Internal Medicine

## 2021-01-20 VITALS — Wt 225.0 lb

## 2021-01-20 DIAGNOSIS — Z1211 Encounter for screening for malignant neoplasm of colon: Secondary | ICD-10-CM | POA: Diagnosis not present

## 2021-01-20 DIAGNOSIS — Z23 Encounter for immunization: Secondary | ICD-10-CM | POA: Diagnosis not present

## 2021-01-20 DIAGNOSIS — K620 Anal polyp: Secondary | ICD-10-CM

## 2021-01-20 NOTE — Addendum Note (Signed)
Addended by: Gwenyth Ober R on: 01/20/2021 01:15 PM   Modules accepted: Orders

## 2021-01-20 NOTE — Patient Instructions (Signed)
Health Maintenance Due  Topic Date Due   COVID-19 Vaccine (5 - Booster for Pfizer series) 01/14/2021    Depression screen [facility] 2/9 06/24/2020 06/22/2019 01/09/2019  Decreased Interest 0 0 0  Down, Depressed, Hopeless 0 0 0  PHQ - 2 Score 0 0 0  Altered sleeping 2 1 3   Tired, decreased energy 1 1 1   Change in appetite 0 0 0  Feeling bad or failure about yourself  0 0 0  Trouble concentrating 0 0 0  Moving slowly or fidgety/restless 0 0 0  Suicidal thoughts 0 0 0  PHQ-9 Score 3 2 4   Difficult doing work/chores Not difficult at all Not difficult at all Not difficult at all

## 2021-01-20 NOTE — Progress Notes (Signed)
Virtual Visit via Video Note  I connected with Antonio Ayers on 01/20/21 at 11:30 AM EDT by a video enabled telemedicine application and verified that I am speaking with the correct person using two identifiers.  Location patient: home Location provider: work office Persons participating in the virtual visit: patient, provider  I discussed the limitations of evaluation and management by telemedicine and the availability of in person appointments. The patient expressed understanding and agreed to proceed.   HPI: He scheduled this visit to discuss my opinion on surgery.  He had a colonoscopy in March.  He was found to have a large polyp at the anal verge which is thought to be benign but because of the location was unable to be biopsied or excised.  GI sent him to surgery for an opinion.  And decision was made to proceed with surgery.  Patient is wanting a second opinion.  He has already scheduled an appointment tomorrow with the [facility].  He is calling because he would also like to get my opinion on surgery.   ROS: Constitutional: Denies fever, chills, diaphoresis, appetite change and fatigue.  HEENT: Denies photophobia, eye pain, redness, hearing loss, ear pain, congestion, sore throat, rhinorrhea, sneezing, mouth sores, trouble swallowing, neck pain, neck stiffness and tinnitus.   Respiratory: Denies SOB, DOE, cough, chest tightness,  and wheezing.   Cardiovascular: Denies chest pain, palpitations and leg swelling.  Gastrointestinal: Denies nausea, vomiting, abdominal pain, diarrhea, constipation, blood in stool and abdominal distention.  Genitourinary: Denies dysuria, urgency, frequency, hematuria, flank pain and difficulty urinating.  Endocrine: Denies: hot or cold intolerance, sweats, changes in hair or nails, polyuria, polydipsia. Musculoskeletal: Denies myalgias, back pain, joint swelling, arthralgias and gait problem.  Skin: Denies pallor, rash and wound.   Neurological: Denies dizziness, seizures, syncope, weakness, light-headedness, numbness and headaches.  Hematological: Denies adenopathy. Easy bruising, personal or family bleeding history  Psychiatric/Behavioral: Denies suicidal ideation, mood changes, confusion, nervousness, sleep disturbance and agitation   Past Medical History:  Diagnosis Date   Abdominal hernia    Agatston coronary artery calcium score greater than 400    coronary Ca 454 on CT 2021   Ankylosing spondylitis ([place])    Aortic aneurysm (HCC)    mildly dilated ascending aorta at 87mm and aortic root 53mm by echo 03/2020   Aortic insufficiency    Chronic diarrhea    Colon polyp    GERD (gastroesophageal reflux disease)    Nephrolithiasis     Past Surgical History:  Procedure Laterality Date   CATARACT EXTRACTION Left 2016   CHOLECYSTECTOMY  01/2018   COLONOSCOPY     02/2015   CORNEAL TRANSPLANT  1989, 1992, 2014   HERNIA REPAIR  2018   HIATAL HERNIA REPAIR     KIDNEY STONE SURGERY  03/2018   PROSTATE BIOPSY     x3   UPPER GASTROINTESTINAL ENDOSCOPY      Family History  Problem Relation Age of Onset   Stroke Mother    Stroke Father    Heart failure Father    Diabetes Sister    Obesity Sister    Hiatal hernia Sister    Colon cancer Neg Hx    Esophageal cancer Neg Hx    Rectal cancer Neg Hx    Stomach cancer Neg Hx     SOCIAL HX:   reports that he has never smoked. He has never used smokeless tobacco. He reports current alcohol use. He reports previous drug use.   Current  Outpatient Medications:    acetaminophen (TYLENOL) 325 MG tablet, Take by mouth as needed., Disp: , Rfl:    Adalimumab (HUMIRA PEN) 40 MG/0.4ML PNKT, Inject 40 mg into the skin every 21 ( twenty-one) days., Disp: 3 each, Rfl: 0   atorvastatin (LIPITOR) 20 MG tablet, Take 1 tablet (20 mg total) by mouth daily., Disp: 90 tablet, Rfl: 3   Cholecalciferol 25 MCG (1000 UT) capsule, Take 1,000 Units by mouth daily. , Disp: , Rfl:     COMBIGAN 0.2-0.5 % ophthalmic solution, INT 1 GTT IN OU BID, Disp: , Rfl:    indapamide (LOZOL) 1.25 MG tablet, TK 1 T PO QD, Disp: , Rfl:    latanoprost (XALATAN) 0.005 % ophthalmic solution, INT 1 GTT IN OS QPM B DINNER, Disp: , Rfl:    LOTEMAX 0.5 % GEL, INT 1 GTT IN OS D, Disp: , Rfl:    Polyethylene Glycol 3350 (MIRALAX PO), Take by mouth as needed., Disp: , Rfl:    potassium citrate (UROCIT-K) 10 MEQ (1080 MG) SR tablet, 10 mEq 3 (three) times daily. , Disp: , Rfl:   EXAM:  VITALS per patient if applicable: None reported  GENERAL: alert, oriented, appears well and in no acute distress  HEENT: atraumatic, conjunttiva clear, no obvious abnormalities on inspection of external nose and ears  NECK: normal movements of the head and neck  LUNGS: on inspection no signs of respiratory distress, breathing rate appears normal, no obvious gross increased work of breathing, gasping or wheezing  CV: no obvious cyanosis  MS: moves all visible extremities without noticeable abnormality  PSYCH/NEURO: pleasant and cooperative, no obvious depression or anxiety, speech and thought processing grossly intact  ASSESSMENT AND PLAN:    Anal polyp -At this point my opinion would be to follow the recommendations of the surgeon. -In any case he should have a repeat colonoscopy in 3 years which is what was recommended by GI.     I discussed the assessment and treatment plan with the patient. The patient was provided an opportunity to ask questions and all were answered. The patient agreed with the plan and demonstrated an understanding of the instructions.   The patient was advised to call back or seek an in-person evaluation if the symptoms worsen or if the condition fails to improve as anticipated.    Lelon Frohlich, MD  [facility] at [facility]

## 2021-01-20 NOTE — Telephone Encounter (Signed)
The patient was just on a virtual with Dr. Jerilee Hoh  and was disconnected and was wanting to get more information about the COVID Booster.  He would like Dr. Jerilee Hoh response to be sent to him through [place].  Please advise

## 2021-01-21 DIAGNOSIS — K648 Other hemorrhoids: Secondary | ICD-10-CM | POA: Diagnosis not present

## 2021-01-26 ENCOUNTER — Encounter: Payer: Self-pay | Admitting: *Deleted

## 2021-01-26 ENCOUNTER — Other Ambulatory Visit: Payer: Self-pay | Admitting: Hematology and Oncology

## 2021-01-26 ENCOUNTER — Inpatient Hospital Stay: Payer: Medicare Other

## 2021-01-26 ENCOUNTER — Inpatient Hospital Stay: Payer: Medicare Other | Attending: Hematology and Oncology | Admitting: Hematology and Oncology

## 2021-01-26 ENCOUNTER — Other Ambulatory Visit: Payer: Self-pay

## 2021-01-26 VITALS — BP 92/70 | HR 59 | Temp 97.0°F | Resp 18 | Wt 224.6 lb

## 2021-01-26 DIAGNOSIS — D472 Monoclonal gammopathy: Secondary | ICD-10-CM | POA: Diagnosis not present

## 2021-01-26 DIAGNOSIS — K620 Anal polyp: Secondary | ICD-10-CM | POA: Diagnosis not present

## 2021-01-26 DIAGNOSIS — D801 Nonfamilial hypogammaglobulinemia: Secondary | ICD-10-CM

## 2021-01-26 DIAGNOSIS — Z79899 Other long term (current) drug therapy: Secondary | ICD-10-CM | POA: Diagnosis not present

## 2021-01-26 LAB — CMP (CANCER CENTER ONLY)
ALT: 31 U/L (ref 0–44)
AST: 26 U/L (ref 15–41)
Albumin: 4.2 g/dL (ref 3.5–5.0)
Alkaline Phosphatase: 69 U/L (ref 38–126)
Anion gap: 7 (ref 5–15)
BUN: 15 mg/dL (ref 8–23)
CO2: 30 mmol/L (ref 22–32)
Calcium: 9.2 mg/dL (ref 8.9–10.3)
Chloride: 100 mmol/L (ref 98–111)
Creatinine: 0.87 mg/dL (ref 0.61–1.24)
GFR, Estimated: >60 mL/min (ref >60)
Glucose, Bld: 75 mg/dL (ref 70–99)
Potassium: 4.3 mmol/L (ref 3.5–5.1)
Sodium: 137 mmol/L (ref 135–145)
Total Bilirubin: 4.2 mg/dL (ref 0.3–1.2)
Total Protein: 7 g/dL (ref 6.5–8.1)

## 2021-01-26 LAB — CBC WITH DIFFERENTIAL (CANCER CENTER ONLY)
Abs Immature Granulocytes: 0.02 10*3/uL (ref 0.00–0.07)
Basophils Absolute: 0 10*3/uL (ref 0.0–0.1)
Basophils Relative: 0 %
Eosinophils Absolute: 0.1 10*3/uL (ref 0.0–0.5)
Eosinophils Relative: 2 %
HCT: 42.5 % (ref 39.0–52.0)
Hemoglobin: 15.8 g/dL (ref 13.0–17.0)
Immature Granulocytes: 0 %
Lymphocytes Relative: 17 %
Lymphs Abs: 1.2 10*3/uL (ref 0.7–4.0)
MCH: 32.8 pg (ref 26.0–34.0)
MCHC: 37.2 g/dL — ABNORMAL HIGH (ref 30.0–36.0)
MCV: 88.2 fL (ref 80.0–100.0)
Monocytes Absolute: 1 10*3/uL (ref 0.1–1.0)
Monocytes Relative: 14 %
Neutro Abs: 4.7 10*3/uL (ref 1.7–7.7)
Neutrophils Relative %: 67 %
Platelet Count: 175 10*3/uL (ref 150–400)
RBC: 4.82 MIL/uL (ref 4.22–5.81)
RDW: 12.7 % (ref 11.5–15.5)
WBC Count: 7.1 10*3/uL (ref 4.0–10.5)
nRBC: 0 % (ref 0.0–0.2)

## 2021-01-26 NOTE — Progress Notes (Signed)
[place] [phone]   [phone]  PROGRESS NOTE  Patient Care Team: [name], [name], MD as PCP - General (Internal Medicine) Sueanne Margarita, MD as PCP - Cardiology (Cardiology) Viona Gilmore, [facility] as Pharmacist (Pharmacist) Michael Boston, MD as Consulting Physician (General Surgery) Danis, Kirke Corin, MD as Consulting Physician (Gastroenterology) Sueanne Margarita, MD as Consulting Physician (Cardiology) Bo Merino, MD as Consulting Physician (Rheumatology) Irine Seal, MD as Attending Physician (Urology)  Hematological/Oncological History # Hypogammaglobulinemia # Monoclonal Gammopathy of Undetermined Signficance  01/26/2019: last visit with Dr. Walden Field. Noted to have history of hypogammaglobulinemia.  12/18/2020: SPEP showed poorly defined bane of restrict protein. IFE showed faint IgA kappa.  01/26/2021: establish care with Dr. Lorenso Courier  #Anal Polyp 11/06/2020: large anal polyp noted on colonoscopy 03/20/2021: anticipated date of resection  Interval History:  Antonio Ayers 70 y.o. male with medical history significant for MGUS/hypogammaglobulinemia who presents for a follow up visit. The patient's last visit was on 01/25/2021 with Dr. Walden Field. In the interim since the last visit he has continued to follow with rheumatology for ankylosing spondylitis.  On exam today Antonio Ayers reports he has been well in the interim since his last visit.  He reports that an anal polyp was discovered on his last colonoscopy which was too large to biopsy or remove endoscopically.  He is currently scheduled to have this surgically resected on 03/20/2021.  He notes otherwise he has been in good health.  His ankylosing spondylitis is currently at his baseline with no new back pain.  He notes that his energy is "so-so".  He denies any bleeding, bruising, or dark stools.  He otherwise denies any fevers, chills, sweats, nausea, vomiting or diarrhea.  A  full 10 point ROS is listed below.  MEDICAL HISTORY:  Past Medical History:  Diagnosis Date   Abdominal hernia    Agatston coronary artery calcium score greater than 400    coronary Ca 454 on CT 2021   Ankylosing spondylitis (HCC)    Aortic aneurysm (HCC)    mildly dilated ascending aorta at 24m and aortic root 361mby echo 03/2020   Aortic insufficiency    Chronic diarrhea    Colon polyp    GERD (gastroesophageal reflux disease)    Nephrolithiasis     SURGICAL HISTORY: Past Surgical History:  Procedure Laterality Date   CATARACT EXTRACTION Left 2016   CHOLECYSTECTOMY  01/2018   COLONOSCOPY     02/2015   CORNEAL TRANSPLANT  1989, 1992, 2014   HERNIA REPAIR  2018   HIATAL HERNIA REPAIR     KIDNEY STONE SURGERY  03/2018   PROSTATE BIOPSY     x3   UPPER GASTROINTESTINAL ENDOSCOPY      SOCIAL HISTORY: Social History   Socioeconomic History   Marital status: Single    Spouse name: Not on file   Number of children: Not on file   Years of education: Not on file   Highest education level: Not on file  Occupational History   Occupation: retired   Tobacco Use   Smoking status: Never   Smokeless tobacco: Never  Vaping Use   Vaping Use: Never used  Substance and Sexual Activity   Alcohol use: Yes    Comment: Occasionally   Drug use: Not Currently   Sexual activity: Not on file  Other Topics Concern   Not on file  Social History Narrative   Not on file   Social Determinants of Health  Financial Resource Strain: Not on file  Food Insecurity: Not on file  Transportation Needs: Not on file  Physical Activity: Not on file  Stress: Not on file  Social Connections: Not on file  Intimate Partner Violence: Not on file    FAMILY HISTORY: Family History  Problem Relation Age of Onset   Stroke Mother    Stroke Father    Heart failure Father    Diabetes Sister    Obesity Sister    Hiatal hernia Sister    Colon cancer Neg Hx    Esophageal cancer Neg Hx     Rectal cancer Neg Hx    Stomach cancer Neg Hx     ALLERGIES:  has No Known Allergies.  MEDICATIONS:  Current Outpatient Medications  Medication Sig Dispense Refill   acetaminophen (TYLENOL) 325 MG tablet Take by mouth as needed.     Adalimumab (HUMIRA PEN) 40 MG/0.4ML PNKT Inject 40 mg into the skin every 21 ( twenty-one) days. 3 each 0   atorvastatin (LIPITOR) 20 MG tablet Take 1 tablet (20 mg total) by mouth daily. 90 tablet 3   Cholecalciferol 25 MCG (1000 UT) capsule Take 1,000 Units by mouth daily.      COMBIGAN 0.2-0.5 % ophthalmic solution INT 1 GTT IN OU BID     indapamide (LOZOL) 1.25 MG tablet TK 1 T PO QD     latanoprost (XALATAN) 0.005 % ophthalmic solution INT 1 GTT IN OS QPM B DINNER     LOTEMAX 0.5 % GEL INT 1 GTT IN OS D     Polyethylene Glycol 3350 (MIRALAX PO) Take by mouth as needed.     potassium citrate (UROCIT-K) 10 MEQ (1080 MG) SR tablet 10 mEq 3 (three) times daily.      No current facility-administered medications for this visit.    REVIEW OF SYSTEMS:   Constitutional: ( - ) fevers, ( - )  chills , ( - ) night sweats Eyes: ( - ) blurriness of vision, ( - ) double vision, ( - ) watery eyes Ears, nose, mouth, throat, and face: ( - ) mucositis, ( - ) sore throat Respiratory: ( - ) cough, ( - ) dyspnea, ( - ) wheezes Cardiovascular: ( - ) palpitation, ( - ) chest discomfort, ( - ) lower extremity swelling Gastrointestinal:  ( - ) nausea, ( - ) heartburn, ( - ) change in bowel habits Skin: ( - ) abnormal skin rashes Lymphatics: ( - ) new lymphadenopathy, ( - ) easy bruising Neurological: ( - ) numbness, ( - ) tingling, ( - ) new weaknesses Behavioral/Psych: ( - ) mood change, ( - ) new changes  All other systems were reviewed with the patient and are negative.  PHYSICAL EXAMINATION: ECOG PERFORMANCE STATUS: 1 - Symptomatic but completely ambulatory  Vitals:   01/26/21 0958  BP: 92/70  Pulse: (!) 59  Resp: 18  Temp: (!) 97 F (36.1 C)  SpO2: 99%    Filed Weights   01/26/21 0958  Weight: 224 lb 9.6 oz (101.9 kg)    GENERAL: Well-appearing elderly Caucasian male, alert, no distress and comfortable SKIN: skin color, texture, turgor are normal, no rashes or significant lesions EYES: conjunctiva are pink and non-injected, sclera clear LUNGS: clear to auscultation and percussion with normal breathing effort HEART: regular rate & rhythm and no murmurs and no lower extremity edema PSYCH: alert & oriented x 3, fluent speech NEURO: no focal motor/sensory deficits  LABORATORY DATA:  I have reviewed  the data as listed CBC Latest Ref Rng & Units 01/26/2021 12/18/2020 09/03/2020  WBC 4.0 - 10.5 K/uL 7.1 8.4 8.1  Hemoglobin 13.0 - 17.0 g/dL 15.8 15.9 15.4  Hematocrit 39.0 - 52.0 % 42.5 45.9 41.1  Platelets 150 - 400 K/uL 175 183 170    CMP Latest Ref Rng & Units 01/26/2021 12/18/2020 12/18/2020  Glucose 70 - 99 mg/dL 75 - 92  BUN 8 - 23 mg/dL 15 - 15  Creatinine 0.61 - 1.24 mg/dL 0.87 - 0.74  Sodium 135 - 145 mmol/L 137 - 138  Potassium 3.5 - 5.1 mmol/L 4.3 - 4.1  Chloride 98 - 111 mmol/L 100 - 100  CO2 22 - 32 mmol/L 30 - 28  Calcium 8.9 - 10.3 mg/dL 9.2 - 9.5  Total Protein 6.5 - 8.1 g/dL 7.0 6.7 6.7  Total Bilirubin 0.3 - 1.2 mg/dL 4.2(HH) - 3.3(H)  Alkaline Phos 38 - 126 U/L 69 - -  AST 15 - 41 U/L 26 - 23  ALT 0 - 44 U/L 31 - 25    No results found for: MPROTEIN Lab Results  Component Value Date   KPAFRELGTCHN 14.0 01/12/2019   LAMBDASER 8.3 01/12/2019   KAPLAMBRATIO 1.69 (H) 01/12/2019    RADIOGRAPHIC STUDIES: No results found.  ASSESSMENT & PLAN Antonio Ayers 70 y.o. male with medical history significant for MGUS/hypogammaglobulinemia who presents for a follow up visit.   # Hypogammaglobulinemia # Monoclonal Gammopathy of Undetermined Signficance  --patient has an IgA kappa MGUS. Protein levels are barely detectable on SPEP/IFE --no evidence of hypogammaglobulinemia on labs today --assure yearly UPEP and  metastatic survey --no clear indication for bone marrow biopsy at this time --RTC in 6 months for continued monitoring  #Anal Polyp --scheduled for surgery on 03/20/21.  --follow up pathology  No orders of the defined types were placed in this encounter.   All questions were answered. The patient knows to call the clinic with any problems, questions or concerns.  A total of more than 40 minutes were spent on this encounter with face-to-face time and non-face-to-face time, including preparing to see the patient, ordering tests and/or medications, counseling the patient and coordination of care as outlined above.   Ledell Peoples, MD Department of Hematology/Oncology [place] at [facility] Phone: [phone] Pager: [phone] Email: [email or web address]  02/01/2021 9:02 PM

## 2021-01-26 NOTE — Progress Notes (Unsigned)
Critical Total Bilirubin 4.2 called  and read back to Drucie Ip, RN at 1059 am 01/26/21 by Dorian Furnace (MT)

## 2021-01-27 LAB — IGG, IGA, IGM
IgA: 518 mg/dL — ABNORMAL HIGH (ref 61–437)
IgG (Immunoglobin G), Serum: 819 mg/dL (ref 603–1613)
IgM (Immunoglobulin M), Srm: 41 mg/dL (ref 20–172)

## 2021-01-28 LAB — CEA (IN HOUSE-CHCC): CEA (CHCC-In House): 1.48 ng/mL (ref 0.00–5.00)

## 2021-01-29 ENCOUNTER — Telehealth: Payer: Self-pay | Admitting: Pharmacist

## 2021-01-29 NOTE — Chronic Care Management (AMB) (Signed)
    Chronic Care Management Pharmacy Assistant   Name: Antonio Ayers  MRN: [id number] DOB: 1951-04-22  Reason for Encounter: General Adherence Call       Recent office visits:  06.14.2022 [name], [name], MD- Video visit- discuss opinion of surgery  Recent consult visits:  06.20.2022 Orson Slick, MD Hematology and Oncology follow-up visit- seen for history significant for MGUS/hypogammaglobulinemia.  06.15.2022 Guerry Minors, MD- [facility]- follow-up visit seen for hemorrhoids 06.09.2022 Landis Martins, DPM Podiatry- seen for acute visit- complaint of mildly painful thickened and elongated toenails 05.16.2022 Sueanne Margarita, MD Cardiology- Patient seen for follow-up for Nonrheumatic aortic valve insufficiency 05.14.2022 Ofilia Neas, PA-Rheumatology- patient seen for follow-up visit for ankylosing spondylitis  Hospital visits:  None in previous 6 months  Medications: Outpatient Encounter Medications as of 01/29/2021  Medication Sig   acetaminophen (TYLENOL) 325 MG tablet Take by mouth as needed.   Adalimumab (HUMIRA PEN) 40 MG/0.4ML PNKT Inject 40 mg into the skin every 21 ( twenty-one) days.   atorvastatin (LIPITOR) 20 MG tablet Take 1 tablet (20 mg total) by mouth daily.   Cholecalciferol 25 MCG (1000 UT) capsule Take 1,000 Units by mouth daily.    COMBIGAN 0.2-0.5 % ophthalmic solution INT 1 GTT IN OU BID   indapamide (LOZOL) 1.25 MG tablet TK 1 T PO QD   latanoprost (XALATAN) 0.005 % ophthalmic solution INT 1 GTT IN OS QPM B DINNER   LOTEMAX 0.5 % GEL INT 1 GTT IN OS D   Polyethylene Glycol 3350 (MIRALAX PO) Take by mouth as needed.   potassium citrate (UROCIT-K) 10 MEQ (1080 MG) SR tablet 10 mEq 3 (three) times daily.    No facility-administered encounter medications on file as of 01/29/2021.   I called and spoke with the patient about medication adherence. He stated that he has been doing well. There have been no recent  changes to his medications. He states he is taking his medication as prescribed. He stated that he has surgery in August. There have been no changes in his diet. He does state that has been walking more times a week. There have been no recent falls or injuries. He continues to take his medications as prescribed. There have been no hospital, emergency department, or urgent care visits since his last CPP or PCP visit. His next CCM appointment is scheduled for August 2022. He currently does not have any issues with her pharmacy. He is using [facility].   Star Rating Drugs: Medication Dispensed  Quantity Pharmacy  Atorvastatin 20 mg 04.28.2022 90 [facility]   Amilia ([place]) Mare Ferrari, [place] Pharmacist Assistant [phone]

## 2021-02-01 ENCOUNTER — Encounter: Payer: Self-pay | Admitting: Hematology and Oncology

## 2021-02-02 ENCOUNTER — Telehealth: Payer: Self-pay | Admitting: Hematology and Oncology

## 2021-02-02 NOTE — Telephone Encounter (Signed)
Scheduled appts per 6/26 sch msg. Called pt, no answer. Left msg with appt date and time.  

## 2021-02-10 ENCOUNTER — Encounter: Payer: Self-pay | Admitting: Internal Medicine

## 2021-02-16 DIAGNOSIS — H401132 Primary open-angle glaucoma, bilateral, moderate stage: Secondary | ICD-10-CM | POA: Diagnosis not present

## 2021-02-19 ENCOUNTER — Other Ambulatory Visit: Payer: Self-pay

## 2021-02-19 DIAGNOSIS — M450 Ankylosing spondylitis of multiple sites in spine: Secondary | ICD-10-CM

## 2021-02-19 MED ORDER — HUMIRA (2 PEN) 40 MG/0.4ML ~~LOC~~ AJKT: 40.0000 mg | AUTO-INJECTOR | SUBCUTANEOUS | 0 refills | Status: DC

## 2021-02-19 NOTE — Telephone Encounter (Signed)
Attempted to contact patient and left message on machine to advise patient that refill of humira has been sent to [facility].

## 2021-02-19 NOTE — Telephone Encounter (Signed)
Patient called stating he called [facility] to request prescription refill of Humira and was told they need a new prescription from Dr. Estanislado Pandy.

## 2021-02-19 NOTE — Telephone Encounter (Signed)
Next Visit: 05/21/2021  Last Visit: 12/18/2020  Last Fill: 11/04/2020  DX: Ankylosing spondylitis of multiple sites in spine  Current Dose per office note on 12/18/2020: Humira 40 mg sq injections every 21 days.    Labs: 01/26/2021 total bilirubin 4.2, MCHC 37.2  TB Gold: 12/18/2020 negative    Okay to refill humira?

## 2021-03-02 ENCOUNTER — Other Ambulatory Visit: Payer: Self-pay

## 2021-03-02 ENCOUNTER — Telehealth: Payer: Self-pay | Admitting: Rheumatology

## 2021-03-02 DIAGNOSIS — I351 Nonrheumatic aortic (valve) insufficiency: Secondary | ICD-10-CM

## 2021-03-02 DIAGNOSIS — I77819 Aortic ectasia, unspecified site: Secondary | ICD-10-CM

## 2021-03-02 NOTE — Telephone Encounter (Signed)
Patient calling to see when he is due for his next lab draw? Please call to advise.

## 2021-03-02 NOTE — Telephone Encounter (Signed)
Left message to advise patient he is due for labs in September 2022.

## 2021-03-05 ENCOUNTER — Telehealth: Payer: Self-pay | Admitting: Pharmacist

## 2021-03-05 NOTE — Chronic Care Management (AMB) (Signed)
Chronic Care Management Pharmacy Assistant   Name: Antonio Ayers  MRN: [id number] DOB: 09/23/1950   Reason for Encounter: General Assessment Call   Conditions to be addressed/monitored: HLD  Recent office visits:  01-20-2021 [name], [name], MD (PCP) - Tele visit for colon cancer screening and other concerns. No medication changes.  Recent consult visits:  01-26-2021 Orson Slick, MD (Oncology) - Patient presented for monoclonal gammopathy of unknown significance and other concerns. No medication changes.  01-21-2021 Guerry Minors, MD (Surg) - Patient presented for second opinion for rectal polyp. No medication changes.   01-15-2021 Landis Martins, DPM (Podiatry) - Patient presented for Pain due to onychomycosis of toenails of both feet. No medication changes.  12-22-2020 Sueanne Margarita, MD ( Cardiology) - Patient presented for Nonrheumatic aortic valve insufficiency and other concerns. No medication changes.   12-18-2020 Ofilia Neas, PA-C (Rheumatology) - Patient presented for Ankylosing spondylitis of multiple sites in spine and other concerns. Discontinued COLACE 100 MG capsule   Hospital visits:  None in previous 6 months  Medications: Outpatient Encounter Medications as of 03/05/2021  Medication Sig   acetaminophen (TYLENOL) 325 MG tablet Take 325 mg by mouth every 6 (six) hours as needed for mild pain or moderate pain.   Adalimumab (HUMIRA PEN) 40 MG/0.4ML PNKT Inject 40 mg into the skin every 21 ( twenty-one) days.   atorvastatin (LIPITOR) 20 MG tablet Take 1 tablet (20 mg total) by mouth daily.   brimonidine-timolol (COMBIGAN) 0.2-0.5 % ophthalmic solution Place 1 drop into both eyes 2 (two) times daily.   Cholecalciferol 25 MCG (1000 UT) capsule Take 1,000 Units by mouth daily.    indapamide (LOZOL) 1.25 MG tablet Take 1.25 mg by mouth daily.   latanoprost (XALATAN) 0.005 % ophthalmic solution Place 1 drop into both eyes at bedtime.   LOTEMAX  0.5 % GEL Place 1 drop into the left eye daily.   polyethylene glycol (MIRALAX / GLYCOLAX) 17 g packet Take 17 g by mouth daily as needed (Constipation).   potassium citrate (UROCIT-K) 10 MEQ (1080 MG) SR tablet Take 10 mEq by mouth 3 (three) times daily.   No facility-administered encounter medications on file as of 03/05/2021.   03/05/2021 Name: Antonio Ayers MRN: [id number] DOB: 1951-03-02 Antonio Ayers is a 70 y.o. year old male who is a primary care patient of [name], [name], MD.  Comprehensive medication review performed; Spoke to patient regarding cholesterol  Lipid Panel    Component Value Date/Time   CHOL 107 10/21/2020 0850   TRIG 72 10/21/2020 0850   HDL 53 10/21/2020 0850   LDLCALC 39 10/21/2020 0850    10-year ASCVD risk score: The ASCVD Risk score Mikey Bussing DC Jr., et al., 2013) failed to calculate for the following reasons:   The valid total cholesterol range is 130 to 320 mg/dL  Current antihyperlipidemic regimen:  Atorvastatin 20 mg 1 tablet daily Previous antihyperlipidemic medications tried: None ASCVD risk enhancing conditions: age >67 What recent interventions/DTPs have been made by any provider to improve Cholesterol control since last CPP Visit: Patient reports none Any recent hospitalizations or ED visits since last visit with CPP? No What diet changes have been made to improve Cholesterol?  Patient reports he eats pretty good meals breakfasts include eggs,oatmeal and toast. For lunch he will have leftovers from previous days dinner. For dinner he gets prepared heart healthy meals that include a meat and vegetable such as broccoli and chicken with pesto sauce.  Patient reports he is drinking 5-12 ounces of water daily and fruit juice. What exercise is being done to improve Cholesterol?  Patient reports he walks during the day and early when its hot out to avoid the heat he walks a little under a mile a day.  Adherence Review: Does the patient have  >5 day gap between last estimated fill dates? No Patent reports he has one week of Atorvastatin tablets left is scheduled to pick up from the pharmacy next week. Fill History Gaps :  ATORVASTATIN '20MG'$  TABLETS 12/04/2020 90   Care Gaps:  CCM F/U Call - Scheduled 05/04/21 10am AWV- Needs appointment MSG sent to Ramond Craver CMA to schedule. COVID Booster #5 [employer]) - [employer] Rating Drugs:  Atorvastatin 20 mg- Last filled 12-04-2020 [facility] at [place] Pharmacist Assistant [phone]

## 2021-03-06 NOTE — Progress Notes (Signed)
DUE TO COVID-19 ONLY ONE VISITOR IS ALLOWED TO COME WITH YOU AND STAY IN THE WAITING ROOM ONLY DURING PRE OP AND PROCEDURE DAY OF SURGERY. THE 1 VISITOR  MAY VISIT WITH YOU AFTER SURGERY IN YOUR PRIVATE ROOM DURING VISITING HOURS ONLY!  YOU NEED TO HAVE A COVID 19 TEST ON_______ '@_______'$ , THIS TEST MUST BE DONE BEFORE SURGERY,  COVID TESTING SITE [address] [place] [address], IT IS ON THE RIGHT GOING OUT [address] APPROXIMATELY  2 MINUTES PAST [employer] ON THE RIGHT. ONCE YOUR COVID TEST IS COMPLETED,  PLEASE BEGIN THE QUARANTINE INSTRUCTIONS AS OUTLINED IN YOUR HANDOUT.                Antonio Ayers  03/06/2021   Your procedure is scheduled on:              03/20/2021   Report to [facility] Main  Entrance   Report to admitting at     130pm     Call this number if you have problems the morning of surgery [phone]    REMEMBER: NO  SOLID FOOD CANDY OR GUM AFTER MIDNIGHT. CLEAR LIQUIDS UNTIL  1230 pm        . NOTHING BY MOUTH EXCEPT CLEAR LIQUIDS UNTIL    123  pm  . PLEASE FINISH ENSURE DRINK PER SURGEON ORDER  WHICH NEEDS TO BE COMPLETED AT  1230 pm    .      CLEAR LIQUID DIET   Foods Allowed                                                                    Coffee and tea, regular and decaf                            Fruit ices (not with fruit pulp)                                      Iced Popsicles                                    Carbonated beverages, regular and diet                                    Cranberry, grape and apple juices Sports drinks like Gatorade Lightly seasoned clear broth or consume(fat free) Sugar, honey syrup ___________________________________________________________________      BRUSH YOUR TEETH MORNING OF SURGERY AND RINSE YOUR MOUTH OUT, NO CHEWING GUM CANDY OR MINTS.     Take these medicines the morning of surgery with A SIP OF WATER: eye drops as usual, urocit k   DO NOT TAKE ANY DIABETIC  MEDICATIONS DAY OF YOUR SURGERY                               You may not have any metal on your body including  hair pins and              piercings  Do not wear jewelry, make-up, lotions, powders or perfumes, deodorant             Do not wear nail polish on your fingernails.  Do not shave  48 hours prior to surgery.              Men may shave face and neck.   Do not bring valuables to the hospital. [place].  Contacts, dentures or bridgework may not be worn into surgery.  Leave suitcase in the car. After surgery it may be brought to your room.     Patients discharged the day of surgery will not be allowed to drive home. IF YOU ARE HAVING SURGERY AND GOING HOME THE SAME DAY, YOU MUST HAVE AN ADULT TO DRIVE YOU HOME AND BE WITH YOU FOR 24 HOURS. YOU MAY GO HOME BY TAXI OR UBER OR ORTHERWISE, BUT AN ADULT MUST ACCOMPANY YOU HOME AND STAY WITH YOU FOR 24 HOURS.  Name and phone number of your driver:  Special Instructions: N/A              Please read over the following fact sheets you were given: _____________________________________________________________________  [facility] - Preparing for Surgery Before surgery, you can play an important role.  Because skin is not sterile, your skin needs to be as free of germs as possible.  You can reduce the number of germs on your skin by washing with CHG (chlorahexidine gluconate) soap before surgery.  CHG is an antiseptic cleaner which kills germs and bonds with the skin to continue killing germs even after washing. Please DO NOT use if you have an allergy to CHG or antibacterial soaps.  If your skin becomes reddened/irritated stop using the CHG and inform your nurse when you arrive at Short Stay. Do not shave (including legs and underarms) for at least 48 hours prior to the first CHG shower.  You may shave your face/neck. Please follow these instructions carefully:  1.  Shower with CHG Soap the night  before surgery and the  morning of Surgery.  2.  If you choose to wash your hair, wash your hair first as usual with your  normal  shampoo.  3.  After you shampoo, rinse your hair and body thoroughly to remove the  shampoo.                           4.  Use CHG as you would any other liquid soap.  You can apply chg directly  to the skin and wash                       Gently with a scrungie or clean washcloth.  5.  Apply the CHG Soap to your body ONLY FROM THE NECK DOWN.   Do not use on face/ open                           Wound or open sores. Avoid contact with eyes, ears mouth and genitals (private parts).                       Wash face,  Genitals (private parts) with  your normal soap.             6.  Wash thoroughly, paying special attention to the area where your surgery  will be performed.  7.  Thoroughly rinse your body with warm water from the neck down.  8.  DO NOT shower/wash with your normal soap after using and rinsing off  the CHG Soap.                9.  Pat yourself dry with a clean towel.            10.  Wear clean pajamas.            11.  Place clean sheets on your bed the night of your first shower and do not  sleep with pets. Day of Surgery : Do not apply any lotions/deodorants the morning of surgery.  Please wear clean clothes to the hospital/surgery center.  FAILURE TO FOLLOW THESE INSTRUCTIONS MAY RESULT IN THE CANCELLATION OF YOUR SURGERY PATIENT SIGNATURE_________________________________  NURSE SIGNATURE__________________________________  ________________________________________________________________________

## 2021-03-09 ENCOUNTER — Encounter (HOSPITAL_COMMUNITY)
Admission: RE | Admit: 2021-03-09 | Discharge: 2021-03-09 | Disposition: A | Discharge status: Home / Self Care | Payer: Medicare Other | Source: Ambulatory Visit | Attending: Surgery | Admitting: Surgery

## 2021-03-09 ENCOUNTER — Other Ambulatory Visit: Payer: Self-pay

## 2021-03-09 ENCOUNTER — Encounter (HOSPITAL_COMMUNITY): Payer: Self-pay

## 2021-03-09 DIAGNOSIS — K649 Unspecified hemorrhoids: Secondary | ICD-10-CM | POA: Diagnosis not present

## 2021-03-09 DIAGNOSIS — Z01812 Encounter for preprocedural laboratory examination: Secondary | ICD-10-CM | POA: Diagnosis not present

## 2021-03-09 HISTORY — DX: Other specified postprocedural states: Z98.890

## 2021-03-09 HISTORY — DX: Headache, unspecified: R51.9

## 2021-03-09 HISTORY — DX: Other complications of anesthesia, initial encounter: T88.59XA

## 2021-03-09 HISTORY — DX: Other specified postprocedural states: R11.2

## 2021-03-09 HISTORY — DX: Personal history of urinary calculi: Z87.442

## 2021-03-09 HISTORY — DX: Cardiac murmur, unspecified: R01.1

## 2021-03-09 LAB — CBC
HCT: 41.6 % (ref 39.0–52.0)
Hemoglobin: 15.1 g/dL (ref 13.0–17.0)
MCH: 32.8 pg (ref 26.0–34.0)
MCHC: 36.3 g/dL — ABNORMAL HIGH (ref 30.0–36.0)
MCV: 90.2 fL (ref 80.0–100.0)
Platelets: 171 10*3/uL (ref 150–400)
RBC: 4.61 MIL/uL (ref 4.22–5.81)
RDW: 12.6 % (ref 11.5–15.5)
WBC: 8.6 10*3/uL (ref 4.0–10.5)
nRBC: 0 % (ref 0.0–0.2)

## 2021-03-09 LAB — BASIC METABOLIC PANEL
Anion gap: 10 (ref 5–15)
BUN: 16 mg/dL (ref 8–23)
CO2: 24 mmol/L (ref 22–32)
Calcium: 9 mg/dL (ref 8.9–10.3)
Chloride: 98 mmol/L (ref 98–111)
Creatinine, Ser: 0.57 mg/dL — ABNORMAL LOW (ref 0.61–1.24)
GFR, Estimated: >60 mL/min (ref >60)
Glucose, Bld: 93 mg/dL (ref 70–99)
Potassium: 4 mmol/L (ref 3.5–5.1)
Sodium: 132 mmol/L — ABNORMAL LOW (ref 135–145)

## 2021-03-09 NOTE — Progress Notes (Addendum)
Anesthesia Review:  PCP: DR Orma Render  Last visit- 01/20/21  Cardiologist : DR [name]- Dunnigan 12/22/20  Chest x-ray : Ct angio chest- 04/11/2019  Ct cardiac scoring 03/17/2020  EKG : 12/22/20  Echo : 03/17/2020  Stress test: 12/31/20  Cardiac Cath :  Activity level:  can do a flight of stairs without difficulty  Sleep Study/ CPAP : none  Fasting Blood Sugar :      / Checks Blood Sugar -- times a day:   Blood Thinner/ Instructions /Last Dose: ASA / Instructions/ Last Dose :   Pt reported at preop appt he takes ? Drug that was not on med list but could not remember what it was.  Instructed pt that I would call him at home this pm .  Atttempted to call pt at home and LVMM.  Pt called back and spoke with pt and the medication he takes is Lozol.  PT reports he takes in the pm.  Instructed pt to take nite before surgery as normally does .  Pt voiced understanding.   At preop appt pt reports he  has bowel prep instructions but wit different times .  Instructed pt to call office of DR Gross for clariifcation.  PT voiced understanding.   PT reports at preop he has ankylosing spondylitis and that surgery moved to main or due to this.  And perhaps problems with intubation. Reported to [employer].  Pt was seen at preop by Roanna Banning.

## 2021-03-11 DIAGNOSIS — K642 Third degree hemorrhoids: Secondary | ICD-10-CM | POA: Diagnosis not present

## 2021-03-11 DIAGNOSIS — K620 Anal polyp: Secondary | ICD-10-CM | POA: Diagnosis not present

## 2021-03-11 DIAGNOSIS — K621 Rectal polyp: Secondary | ICD-10-CM | POA: Diagnosis not present

## 2021-03-12 NOTE — Anesthesia Preprocedure Evaluation (Addendum)
Anesthesia Evaluation  Patient identified by MRN, date of birth, ID band Patient awake    Reviewed: Allergy & Precautions, H&P , NPO status , Patient's Chart, lab work & pertinent test results, reviewed documented beta blocker date and time   History of Anesthesia Complications (+) PONV and history of anesthetic complications  Airway Mallampati: II  TM Distance: >3 FB Neck ROM: Limited  Mouth opening: Limited Mouth Opening  Dental no notable dental hx. (+) Poor Dentition, Chipped, Missing, Implants, Partial Lower,    Pulmonary neg pulmonary ROS,    Pulmonary exam normal breath sounds clear to auscultation       Cardiovascular Exercise Tolerance: Good + CAD   Rhythm:regular Rate:Normal  EKG: 12/22/2020 Rate 76 bpm   CV: Stress Test 12/31/2020 The left ventricular ejection fraction is mildly decreased (45-54%). Nuclear stress EF: 54%. There was no ST segment deviation noted during stress. No T wave inversion was noted during stress. The study is normal. This is a low risk study.  Echo 03/17/2020 1. Left ventricular ejection fraction, by estimation, is 60 to 65%. The  left ventricle has normal function. The left ventricle has no regional  wall motion abnormalities. There is mild left ventricular hypertrophy.  Left ventricular diastolic parameters  are consistent with Grade I diastolic dysfunction (impaired relaxation).  2. Right ventricular systolic function is normal. The right ventricular  size is normal. There is normal pulmonary artery systolic pressure.  3. The mitral valve is normal in structure. No evidence of mitral valve  regurgitation. No evidence of mitral stenosis.  4. The aortic valve is tricuspid. Aortic valve regurgitation is trivial.  Mild aortic valve sclerosis is present, with no evidence of aortic valve  stenosis.  5. Aortic dilatation noted. There is mild dilatation of the ascending  aorta  measuring 39 mm.  6. The inferior vena cava is normal in size with greater than 50%  respiratory variability, suggesting right atrial pressure of 3 mmHg.   Neuro/Psych AS  Neuromuscular disease negative psych ROS   GI/Hepatic Neg liver ROS, hiatal hernia, GERD  Medicated,  Endo/Other  negative endocrine ROS  Renal/GU negative Renal ROS  negative genitourinary   Musculoskeletal  (+) Arthritis ,   Abdominal   Peds  Hematology negative hematology ROS (+)   Anesthesia Other Findings   Reproductive/Obstetrics negative OB ROS                           Anesthesia Physical Anesthesia Plan  ASA: 3  Anesthesia Plan: General   Post-op Pain Management:    Induction: Intravenous  PONV Risk Score and Plan: 2 and Ondansetron and Dexamethasone  Airway Management Planned: Oral ETT and Video Laryngoscope Planned  Additional Equipment: None  Intra-op Plan:   Post-operative Plan: Extubation in OR  Informed Consent: I have reviewed the patients History and Physical, chart, labs and discussed the procedure including the risks, benefits and alternatives for the proposed anesthesia with the patient or authorized representative who has indicated his/her understanding and acceptance.     Dental Advisory Given  Plan Discussed with: CRNA and Anesthesiologist  Anesthesia Plan Comments: (See PAT note 03/09/2021, Konrad Felix, PA-C 70 y.o. never smoker with h/o PONV, GERD, ankylosing spondylitis, anal mass scheduled for above procedure 03/20/2021 with Dr. Michael Boston.   Pt with limited cervical ROM due to ankylosing spondylosis.   S/p percutaneous nephrolithotomy 03/22/2018. Per anesthesia note video laryngoscope used, ETT size 7.5 with no complications noted.  Per 03/20/2018 anesthesia note, "G1V seen with glide, however difficulty inserting ETT through vocal cords. 3rd attempt by attending with successful placement."   Per cardiology preoperative  evaluation 01/01/2021, "Chart reviewed as part of pre-operative protocol coverage. Patient was contacted5/26/2022in reference to pre-operative risk assessment for pending surgery as outlined below. Antonio Culbrethwas last seen on 5/16/2022by Dr. Radford Pax.He underwent a Lexiscan stress test on 12/31/2020 that showed no evidence of ischemia.  Therefore, based on ACC/AHA guidelines, the patient would be at acceptable risk for the planned procedure without further cardiovascular testing.The patient will be low to moderate risk for noncardiac surgery depending on if ends up having an intraabdominal surgery and not just removal of an anal mass and polypectomy.")      Anesthesia Quick Evaluation

## 2021-03-12 NOTE — Progress Notes (Addendum)
Anesthesia Chart Review   Case: [id number] Date/Time: 03/20/21 1515   Procedures:      REMOVAL OF ANAL MASS     HEMORRHOIDECTOMY WITH LIGATION AND HEMORRHOIDOPEXY     ANORECTAL EXAM UNDER ANESTHESIA   Anesthesia type: General   Pre-op diagnosis: ANAL MASS, HEMORRHOIDS PROLAPSING GRADE 3 WITH BLEEDING   Location: [facility] ROOM 03 / [facility]   Surgeons: Michael Boston, MD       DISCUSSION:70 y.o. never smoker with h/o PONV, GERD, ankylosing spondylitis, anal mass scheduled for above procedure 03/20/2021 with Dr. Michael Boston.   Pt with limited cervical ROM due to ankylosing spondylosis.   S/p percutaneous nephrolithotomy 03/22/2018. Per anesthesia note video laryngoscope used, ETT size 7.5 with no complications noted.   Per 03/20/2018 anesthesia note, "G1V seen with glide, however difficulty inserting ETT through vocal cords. 3rd attempt by attending with successful placement."   Per cardiology preoperative evaluation 01/01/2021, "Chart reviewed as part of pre-operative protocol coverage. Patient was contacted 01/01/2021 in reference to pre-operative risk assessment for pending surgery as outlined below.  Antonio Ayers was last seen on 12/22/2020 by Dr. Radford Pax.  He underwent a Lexiscan stress test on 12/31/2020 that showed no evidence of ischemia.   Therefore, based on ACC/AHA guidelines, the patient would be at acceptable risk for the planned procedure without further cardiovascular testing. The patient will be low to moderate risk for noncardiac surgery depending on if ends up having an intraabdominal surgery and not just removal of an anal mass and polypectomy."   VS: BP 110/80   Pulse 81   Temp 36.8 C (Oral)   Resp 16   Ht '6\' 1"'$  (1.854 m)   Wt 105.7 kg   SpO2 96%   BMI 30.74 kg/m   PROVIDERS: [name], [name], MD is PCP   [name], MD is Cardiologist  LABS: Labs reviewed: Acceptable for surgery. (all labs ordered are listed, but only abnormal results are  displayed)  Labs Reviewed  CBC - Abnormal; Notable for the following components:      Result Value   MCHC 36.3 (*)    All other components within normal limits  BASIC METABOLIC PANEL - Abnormal; Notable for the following components:   Sodium 132 (*)    Creatinine, Ser 0.57 (*)    All other components within normal limits     IMAGES:   EKG: 12/22/2020 Rate 76 bpm   CV: Stress Test 12/31/2020 The left ventricular ejection fraction is mildly decreased (45-54%). Nuclear stress EF: 54%. There was no ST segment deviation noted during stress. No T wave inversion was noted during stress. The study is normal. This is a low risk study.   Echo 03/17/2020  1. Left ventricular ejection fraction, by estimation, is 60 to 65%. The  left ventricle has normal function. The left ventricle has no regional  wall motion abnormalities. There is mild left ventricular hypertrophy.  Left ventricular diastolic parameters  are consistent with Grade I diastolic dysfunction (impaired relaxation).   2. Right ventricular systolic function is normal. The right ventricular  size is normal. There is normal pulmonary artery systolic pressure.   3. The mitral valve is normal in structure. No evidence of mitral valve  regurgitation. No evidence of mitral stenosis.   4. The aortic valve is tricuspid. Aortic valve regurgitation is trivial.  Mild aortic valve sclerosis is present, with no evidence of aortic valve  stenosis.   5. Aortic dilatation noted. There is mild dilatation of the ascending  aorta measuring 39 mm.   6. The inferior vena cava is normal in size with greater than 50%  respiratory variability, suggesting right atrial pressure of 3 mmHg. Past Medical History:  Diagnosis Date   Abdominal hernia    Agatston coronary artery calcium score greater than 400    coronary Ca 454 on CT 2021   Ankylosing spondylitis (HCC)    Aortic aneurysm (HCC)    mildly dilated ascending aorta at 35m and aortic  root 390mby echo 03/2020   Aortic insufficiency    Chronic diarrhea    Colon polyp    Complication of anesthesia    GERD (gastroesophageal reflux disease)    Headache    Heart murmur    History of kidney stones    PONV (postoperative nausea and vomiting)     Past Surgical History:  Procedure Laterality Date   CATARACT EXTRACTION Left 2016   CHOLECYSTECTOMY  01/2018   COLONOSCOPY     02/2015   CORNEAL TRANSPLANT  1989, 1992, 2014   HERNIA REPAIR  2018   HIATAL HERNIA REPAIR     KIDNEY STONE SURGERY  03/2018   PROSTATE BIOPSY     x3   UPPER GASTROINTESTINAL ENDOSCOPY      MEDICATIONS:  acetaminophen (TYLENOL) 325 MG tablet   Adalimumab (HUMIRA PEN) 40 MG/0.4ML PNKT   atorvastatin (LIPITOR) 20 MG tablet   brimonidine-timolol (COMBIGAN) 0.2-0.5 % ophthalmic solution   Cholecalciferol 25 MCG (1000 UT) capsule   indapamide (LOZOL) 1.25 MG tablet   latanoprost (XALATAN) 0.005 % ophthalmic solution   LOTEMAX 0.5 % GEL   polyethylene glycol (MIRALAX / GLYCOLAX) 17 g packet   potassium citrate (UROCIT-K) 10 MEQ (1080 MG) SR tablet   No current facility-administered medications for this encounter.    JeKonrad FelixPA-C [facility] Pre-Surgical Testing ([phone]

## 2021-03-16 ENCOUNTER — Encounter (HOSPITAL_COMMUNITY): Payer: Self-pay | Admitting: Surgery

## 2021-03-16 NOTE — Progress Notes (Signed)
Pt called and spoke with Scheduler for PST .  Had some preop questions.  Called pt back and no answer.

## 2021-03-16 NOTE — Progress Notes (Signed)
Nurse called and spoke with pt.  Answered all questions and pt voiced understanding.

## 2021-03-17 ENCOUNTER — Other Ambulatory Visit (HOSPITAL_COMMUNITY): Payer: Medicare Other

## 2021-03-17 NOTE — Progress Notes (Signed)
Pt called back again with questions in regards to Clear liquid diet.  PT has bowel prep instructions and preop instructions from [facility].  Pt states he is to be on a liquid/pureed diet the day before. Surgery.  Preop instructions from [facility] state clear liquids from 12 midnite until 1230 pm day of surgery.  PT unsure of what type of liquids he is to be on for [facility] the day before surgery.  Instructed pt to call office of [facility] and ask them what liquids can he have the day before surgery.  PT voiced understanding and states he will call [facility].

## 2021-03-18 NOTE — Progress Notes (Signed)
Time change on pt's surgery for 03/20/2021.  Also pt called in regards to ensure preop dirnk and when to drink.PT spoke iwht Scheduler.   Called pt and informed pt of surgery time change to 1415-1515.  Informed pt to arrive at 1215pm. Instructed pt he could have clear liquids from 12 midnite until 1115am of surgery and reviewed clear liquid diet with pt.  Informed pt to have Ensure PreSurgery drink completed. By 1115am.  Instructed to write info down and pt stated he had written instructions.  PT voiced understanding.

## 2021-03-19 MED ORDER — BUPIVACAINE LIPOSOME 1.3 % IJ SUSP
20.0000 mL | Freq: Once | INTRAMUSCULAR | Status: DC
  Filled 2021-03-19: qty 20

## 2021-03-20 ENCOUNTER — Ambulatory Visit (HOSPITAL_COMMUNITY)
Admission: RE | Admit: 2021-03-20 | Discharge: 2021-03-20 | Disposition: A | Discharge status: Home / Self Care | Payer: Medicare Other | Source: Ambulatory Visit | Attending: Surgery | Admitting: Surgery

## 2021-03-20 ENCOUNTER — Ambulatory Visit (HOSPITAL_COMMUNITY): Payer: Medicare Other | Admitting: Physician Assistant

## 2021-03-20 ENCOUNTER — Ambulatory Visit (HOSPITAL_COMMUNITY): Payer: Medicare Other | Admitting: Anesthesiology

## 2021-03-20 ENCOUNTER — Encounter (HOSPITAL_COMMUNITY): Payer: Self-pay | Admitting: Surgery

## 2021-03-20 ENCOUNTER — Encounter (HOSPITAL_COMMUNITY)
Admission: RE | Disposition: A | Discharge status: Home / Self Care | Payer: Self-pay | Source: Ambulatory Visit | Attending: Surgery

## 2021-03-20 ENCOUNTER — Other Ambulatory Visit: Payer: Self-pay

## 2021-03-20 DIAGNOSIS — Z79899 Other long term (current) drug therapy: Secondary | ICD-10-CM | POA: Insufficient documentation

## 2021-03-20 DIAGNOSIS — R351 Nocturia: Secondary | ICD-10-CM | POA: Insufficient documentation

## 2021-03-20 DIAGNOSIS — N401 Enlarged prostate with lower urinary tract symptoms: Secondary | ICD-10-CM | POA: Diagnosis not present

## 2021-03-20 DIAGNOSIS — K629 Disease of anus and rectum, unspecified: Secondary | ICD-10-CM | POA: Diagnosis present

## 2021-03-20 DIAGNOSIS — K620 Anal polyp: Secondary | ICD-10-CM | POA: Diagnosis not present

## 2021-03-20 DIAGNOSIS — K449 Diaphragmatic hernia without obstruction or gangrene: Secondary | ICD-10-CM | POA: Diagnosis not present

## 2021-03-20 DIAGNOSIS — K6289 Other specified diseases of anus and rectum: Secondary | ICD-10-CM | POA: Diagnosis not present

## 2021-03-20 DIAGNOSIS — Z6830 Body mass index (BMI) 30.0-30.9, adult: Secondary | ICD-10-CM | POA: Diagnosis not present

## 2021-03-20 DIAGNOSIS — K642 Third degree hemorrhoids: Secondary | ICD-10-CM | POA: Insufficient documentation

## 2021-03-20 DIAGNOSIS — E663 Overweight: Secondary | ICD-10-CM | POA: Diagnosis not present

## 2021-03-20 DIAGNOSIS — F17200 Nicotine dependence, unspecified, uncomplicated: Secondary | ICD-10-CM | POA: Diagnosis not present

## 2021-03-20 DIAGNOSIS — D84821 Immunodeficiency due to drugs: Secondary | ICD-10-CM | POA: Diagnosis not present

## 2021-03-20 HISTORY — DX: Personal history of other diseases of the digestive system: Z87.19

## 2021-03-20 HISTORY — PX: RECTAL EXAM UNDER ANESTHESIA: SHX6399

## 2021-03-20 HISTORY — PX: MASS EXCISION: SHX2000

## 2021-03-20 HISTORY — PX: HEMORRHOID SURGERY: SHX153

## 2021-03-20 SURGERY — EXCISION MASS
Anesthesia: General

## 2021-03-20 MED ORDER — OXYCODONE HCL 5 MG PO TABS
5.0000 mg | ORAL_TABLET | Freq: Once | ORAL | Status: AC | PRN
Start: 2021-03-20 — End: 2021-03-20
  Administered 2021-03-20: 5 mg via ORAL

## 2021-03-20 MED ORDER — MEPERIDINE HCL 50 MG/ML IJ SOLN: 6.2500 mg | INTRAMUSCULAR | Status: DC | PRN

## 2021-03-20 MED ORDER — ACETAMINOPHEN 325 MG PO TABS
ORAL_TABLET | ORAL | Status: AC
  Filled 2021-03-20: qty 2

## 2021-03-20 MED ORDER — SUGAMMADEX SODIUM 200 MG/2ML IV SOLN
INTRAVENOUS | Status: DC | PRN
  Administered 2021-03-20: 200 mg via INTRAVENOUS

## 2021-03-20 MED ORDER — MIDAZOLAM HCL 2 MG/2ML IJ SOLN
INTRAMUSCULAR | Status: AC
  Filled 2021-03-20: qty 2

## 2021-03-20 MED ORDER — CHLORHEXIDINE GLUCONATE CLOTH 2 % EX PADS
6.0000 | MEDICATED_PAD | Freq: Once | CUTANEOUS | Status: AC
  Administered 2021-03-20: 6 via TOPICAL

## 2021-03-20 MED ORDER — DEXAMETHASONE SODIUM PHOSPHATE 10 MG/ML IJ SOLN
INTRAMUSCULAR | Status: DC | PRN
  Administered 2021-03-20: 10 mg via INTRAVENOUS

## 2021-03-20 MED ORDER — FENTANYL CITRATE (PF) 100 MCG/2ML IJ SOLN
25.0000 ug | INTRAMUSCULAR | Status: DC | PRN
  Administered 2021-03-20: 50 ug via INTRAVENOUS

## 2021-03-20 MED ORDER — OXYCODONE HCL 5 MG/5ML PO SOLN
5.0000 mg | Freq: Once | ORAL | Status: DC | PRN
Start: 2021-03-20 — End: 2021-03-21

## 2021-03-20 MED ORDER — LIDOCAINE 2% (20 MG/ML) 5 ML SYRINGE
INTRAMUSCULAR | Status: DC | PRN
  Administered 2021-03-20: 60 mg via INTRAVENOUS

## 2021-03-20 MED ORDER — OXYCODONE HCL 5 MG PO TABS: 5.0000 mg | ORAL_TABLET | Freq: Four times a day (QID) | ORAL | 0 refills | Status: DC | PRN

## 2021-03-20 MED ORDER — CEFTRIAXONE SODIUM 2 G IJ SOLR
2.0000 g | INTRAMUSCULAR | Status: AC
  Administered 2021-03-20: 2 g via INTRAVENOUS
  Filled 2021-03-20: qty 20

## 2021-03-20 MED ORDER — DIBUCAINE (PERIANAL) 1 % EX OINT
TOPICAL_OINTMENT | CUTANEOUS | Status: DC | PRN
  Administered 2021-03-20: 1 via RECTAL

## 2021-03-20 MED ORDER — FENTANYL CITRATE (PF) 100 MCG/2ML IJ SOLN
INTRAMUSCULAR | Status: AC
  Filled 2021-03-20: qty 2

## 2021-03-20 MED ORDER — ONDANSETRON HCL 4 MG/2ML IJ SOLN
4.0000 mg | Freq: Once | INTRAMUSCULAR | Status: AC | PRN
  Administered 2021-03-20: 4 mg via INTRAVENOUS

## 2021-03-20 MED ORDER — ACETAMINOPHEN 160 MG/5ML PO SOLN: 325.0000 mg | ORAL | Status: DC | PRN

## 2021-03-20 MED ORDER — OXYCODONE HCL 5 MG/5ML PO SOLN: 5.0000 mg | Freq: Once | ORAL | Status: AC | PRN

## 2021-03-20 MED ORDER — METRONIDAZOLE 500 MG/100ML IV SOLN
500.0000 mg | INTRAVENOUS | Status: AC
Start: 2021-03-20 — End: 2021-03-20
  Administered 2021-03-20: 500 mg via INTRAVENOUS
  Filled 2021-03-20: qty 100

## 2021-03-20 MED ORDER — ENSURE PRE-SURGERY PO LIQD
296.0000 mL | Freq: Once | ORAL | Status: DC
Start: 2021-03-21 — End: 2021-03-21
  Filled 2021-03-20: qty 296

## 2021-03-20 MED ORDER — ONDANSETRON HCL 4 MG/2ML IJ SOLN: 4.0000 mg | Freq: Once | INTRAMUSCULAR | Status: DC | PRN

## 2021-03-20 MED ORDER — ORAL CARE MOUTH RINSE: 15.0000 mL | Freq: Once | OROMUCOSAL | Status: AC

## 2021-03-20 MED ORDER — BUPIVACAINE-EPINEPHRINE (PF) 0.25% -1:200000 IJ SOLN
INTRAMUSCULAR | Status: AC
  Filled 2021-03-20: qty 30

## 2021-03-20 MED ORDER — FENTANYL CITRATE (PF) 100 MCG/2ML IJ SOLN
INTRAMUSCULAR | Status: DC | PRN
  Administered 2021-03-20: 50 ug via INTRAVENOUS

## 2021-03-20 MED ORDER — PHENYLEPHRINE 40 MCG/ML (10ML) SYRINGE FOR IV PUSH (FOR BLOOD PRESSURE SUPPORT)
PREFILLED_SYRINGE | INTRAVENOUS | Status: DC | PRN
Start: 2021-03-20 — End: 2021-03-20
  Administered 2021-03-20 (×3): 80 ug via INTRAVENOUS

## 2021-03-20 MED ORDER — LIDOCAINE 2% (20 MG/ML) 5 ML SYRINGE
INTRAMUSCULAR | Status: AC
  Filled 2021-03-20: qty 5

## 2021-03-20 MED ORDER — LACTATED RINGERS IV SOLN: INTRAVENOUS | Status: DC

## 2021-03-20 MED ORDER — ROCURONIUM BROMIDE 10 MG/ML (PF) SYRINGE
PREFILLED_SYRINGE | INTRAVENOUS | Status: DC | PRN
  Administered 2021-03-20: 50 mg via INTRAVENOUS

## 2021-03-20 MED ORDER — GABAPENTIN 300 MG PO CAPS
300.0000 mg | ORAL_CAPSULE | ORAL | Status: AC
  Administered 2021-03-20: 300 mg via ORAL
  Filled 2021-03-20: qty 1

## 2021-03-20 MED ORDER — PROPOFOL 10 MG/ML IV BOLUS
INTRAVENOUS | Status: AC
  Filled 2021-03-20: qty 20

## 2021-03-20 MED ORDER — ONDANSETRON HCL 4 MG/2ML IJ SOLN
INTRAMUSCULAR | Status: DC | PRN
  Administered 2021-03-20: 4 mg via INTRAVENOUS

## 2021-03-20 MED ORDER — FENTANYL CITRATE (PF) 100 MCG/2ML IJ SOLN: 25.0000 ug | INTRAMUSCULAR | Status: DC | PRN

## 2021-03-20 MED ORDER — ROCURONIUM BROMIDE 10 MG/ML (PF) SYRINGE
PREFILLED_SYRINGE | INTRAVENOUS | Status: AC
  Filled 2021-03-20: qty 10

## 2021-03-20 MED ORDER — OXYCODONE HCL 5 MG PO TABS: 5.0000 mg | ORAL_TABLET | Freq: Once | ORAL | Status: DC | PRN

## 2021-03-20 MED ORDER — PROPOFOL 10 MG/ML IV BOLUS
INTRAVENOUS | Status: DC | PRN
  Administered 2021-03-20: 100 mg via INTRAVENOUS

## 2021-03-20 MED ORDER — LABETALOL HCL 5 MG/ML IV SOLN
INTRAVENOUS | Status: AC
  Filled 2021-03-20: qty 4

## 2021-03-20 MED ORDER — LABETALOL HCL 5 MG/ML IV SOLN
10.0000 mg | INTRAVENOUS | Status: DC | PRN
  Administered 2021-03-20: 10 mg via INTRAVENOUS

## 2021-03-20 MED ORDER — ACETAMINOPHEN 500 MG PO TABS
1000.0000 mg | ORAL_TABLET | ORAL | Status: AC
  Administered 2021-03-20: 1000 mg via ORAL
  Filled 2021-03-20: qty 2

## 2021-03-20 MED ORDER — ONDANSETRON HCL 4 MG/2ML IJ SOLN
INTRAMUSCULAR | Status: AC
  Filled 2021-03-20: qty 2

## 2021-03-20 MED ORDER — BUPIVACAINE-EPINEPHRINE 0.25% -1:200000 IJ SOLN
INTRAMUSCULAR | Status: DC | PRN
  Administered 2021-03-20: 20 mL

## 2021-03-20 MED ORDER — ACETAMINOPHEN 325 MG PO TABS: 325.0000 mg | ORAL_TABLET | ORAL | Status: DC | PRN

## 2021-03-20 MED ORDER — MIDAZOLAM HCL 5 MG/5ML IJ SOLN
INTRAMUSCULAR | Status: DC | PRN
  Administered 2021-03-20: 1 mg via INTRAVENOUS

## 2021-03-20 MED ORDER — DIBUCAINE (PERIANAL) 1 % EX OINT
TOPICAL_OINTMENT | CUTANEOUS | Status: AC
  Filled 2021-03-20: qty 28

## 2021-03-20 MED ORDER — OXYCODONE HCL 5 MG PO TABS
ORAL_TABLET | ORAL | Status: AC
  Filled 2021-03-20: qty 1

## 2021-03-20 MED ORDER — BUPIVACAINE LIPOSOME 1.3 % IJ SUSP
INTRAMUSCULAR | Status: DC | PRN
  Administered 2021-03-20: 20 mL

## 2021-03-20 MED ORDER — ACETAMINOPHEN 325 MG PO TABS
325.0000 mg | ORAL_TABLET | ORAL | Status: DC | PRN
  Administered 2021-03-20: 650 mg via ORAL

## 2021-03-20 MED ORDER — CHLORHEXIDINE GLUCONATE 0.12 % MT SOLN
15.0000 mL | Freq: Once | OROMUCOSAL | Status: AC
  Administered 2021-03-20: 15 mL via OROMUCOSAL

## 2021-03-20 SURGICAL SUPPLY — 43 items
BAG COUNTER SPONGE SURGICOUNT (BAG) IMPLANT
BAG SURGICOUNT SPONGE COUNTING (BAG)
BENZOIN TINCTURE PRP APPL 2/3 (GAUZE/BANDAGES/DRESSINGS) ×3 IMPLANT
BLADE SURG 15 STRL LF DISP TIS (BLADE) IMPLANT
BLADE SURG 15 STRL SS (BLADE)
CHLORAPREP W/TINT 26 (MISCELLANEOUS) ×3 IMPLANT
CNTNR URN SCR LID CUP LEK RST (MISCELLANEOUS) ×1 IMPLANT
CONT SPEC 4OZ STRL OR WHT (MISCELLANEOUS) ×2
COVER SURGICAL LIGHT HANDLE (MISCELLANEOUS) ×3 IMPLANT
DECANTER SPIKE VIAL GLASS SM (MISCELLANEOUS) ×3 IMPLANT
DRAPE LAPAROTOMY T 102X78X121 (DRAPES) ×3 IMPLANT
DRAPE LAPAROTOMY TRNSV 102X78 (DRAPES) IMPLANT
DRSG PAD ABDOMINAL 8X10 ST (GAUZE/BANDAGES/DRESSINGS) IMPLANT
ELECT REM PT RETURN 15FT ADLT (MISCELLANEOUS) ×3 IMPLANT
GAUZE 4X4 16PLY ~~LOC~~+RFID DBL (SPONGE) ×3 IMPLANT
GAUZE SPONGE 4X4 12PLY STRL (GAUZE/BANDAGES/DRESSINGS) ×3 IMPLANT
GLOVE SURG NEOPR MICRO LF SZ8 (GLOVE) ×3 IMPLANT
GLOVE SURG UNDER LTX SZ8 (GLOVE) ×3 IMPLANT
GOWN STRL REUS W/TWL XL LVL3 (GOWN DISPOSABLE) ×6 IMPLANT
KIT BASIN OR (CUSTOM PROCEDURE TRAY) ×3 IMPLANT
KIT TURNOVER KIT A (KITS) ×3 IMPLANT
LOOP VESSEL MAXI BLUE (MISCELLANEOUS) IMPLANT
NEEDLE HYPO 22GX1.5 SAFETY (NEEDLE) ×3 IMPLANT
PACK BASIC VI WITH GOWN DISP (CUSTOM PROCEDURE TRAY) ×3 IMPLANT
PACK GENERAL/GYN (CUSTOM PROCEDURE TRAY) ×3 IMPLANT
PANTS MESH DISP LRG (UNDERPADS AND DIAPERS) ×1 IMPLANT
PANTS MESH DISPOSABLE L (UNDERPADS AND DIAPERS) ×2
PENCIL SMOKE EVACUATOR (MISCELLANEOUS) IMPLANT
SHEARS HARMONIC 9CM CVD (BLADE) IMPLANT
SPONGE T-LAP 4X18 ~~LOC~~+RFID (SPONGE) IMPLANT
SURGILUBE 2OZ TUBE FLIPTOP (MISCELLANEOUS) ×3 IMPLANT
SUT CHROMIC 2 0 SH (SUTURE) ×6 IMPLANT
SUT CHROMIC 3 0 SH 27 (SUTURE) IMPLANT
SUT MNCRL AB 4-0 PS2 18 (SUTURE) IMPLANT
SUT VIC AB 2-0 SH 27 (SUTURE)
SUT VIC AB 2-0 SH 27X BRD (SUTURE) IMPLANT
SUT VIC AB 2-0 UR6 27 (SUTURE) ×18 IMPLANT
SUT VIC AB 3-0 SH 27 (SUTURE)
SUT VIC AB 3-0 SH 27XBRD (SUTURE) IMPLANT
SYR 20ML LL LF (SYRINGE) ×3 IMPLANT
SYR 3ML LL SCALE MARK (SYRINGE) IMPLANT
TOWEL OR 17X26 10 PK STRL BLUE (TOWEL DISPOSABLE) ×3 IMPLANT
TOWEL OR NON WOVEN STRL DISP B (DISPOSABLE) ×3 IMPLANT

## 2021-03-20 NOTE — Discharge Instructions (Signed)
##############################################  ANORECTAL SURGERY:  POST OPERATIVE INSTRUCTIONS  ######################################################################  EAT Start with a pureed / full liquid diet After 24 hours, gradually transition to a high fiber diet.    CONTROL PAIN Control pain so you can tolerate bowel movements,  walk, sleep, tolerate sneezing/coughing, and go up/down stairs.   HAVE A BOWEL MOVEMENT DAILY Keep your bowels regular to avoid problems.   Taking a fiber supplement every day to keep bowels soft.   Try a laxative to override constipation. Use an antidairrheal to slow down diarrhea.   Call if not better after 2 tries  WALK Walk an hour a day.  Control your pain to do that.   CALL IF YOU HAVE PROBLEMS/CONCERNS Call if you are still struggling despite following these instructions. Call if you have concerns not answered by these instructions  ######################################################################    Take your usually prescribed home medications unless otherwise directed.  DIET: Follow a light bland diet & liquids the first 24 hours after arrival home, such as soup, liquids, starches, etc.  Be sure to drink plenty of fluids.  Quickly advance to a usual solid diet within a few days.  Avoid fast food or heavy meals as your are more likely to get nauseated or have irregular bowels.  A low-fat, high-fiber diet for the rest of your life is ideal.  PAIN CONTROL: Pain is best controlled by a usual combination of many methods TOGETHER: Warm baths/soaks or Ice packs Over the counter pain medication Prescription pain medications Topical creams  Expect swelling and discomfort in the anus/rectal area.  Warm water baths (30-60 minutes up to 6 times a day, especially after bowel meovements) will help. Use ice for the first few days to help decrease swelling and bruising, then switch to heat such as warm towels, sitz baths, warm baths, etc to  help relax tight/sore spots and speed recovery.  Some people prefer to use ice alone, heat alone, alternating between ice & heat.  Experiment to what works for you.   It is helpful to take an over-the-counter pain medication continuously for the first few weeks.  Choose one of the following that works best for you: Naproxen (Aleve, etc)  Two 220mg tabs twice a day Ibuprofen (Advil, etc) Three 200mg tabs four times a day (every meal & bedtime) Acetaminophen (Tylenol, etc) 500-650mg four times a day (every meal & bedtime) A  prescription for pain medication (such as oxycodone, hydrocodone, etc) should be given to you upon discharge.  Take your pain medication as prescribed.  If you are having problems/concerns with the prescription medicine (does not control pain, nausea, vomiting, rash, itching, etc), please call us [phone] to see if we need to switch you to a different pain medicine that will work better for you and/or control your side effect better. If you need a refill on your pain medication, please contact your pharmacy.  They will contact our office to request authorization. Prescriptions will not be filled after 5 pm or on week-ends.  If can take up to 48 hours for it to be filled & ready so avoid waiting until you are down to thel ast pill. A topical cream (Dibucaine) or a prescription for a cream (such as diltiazem 2% gel) may be given to you.  Many people find relief with topical creams.  Some people find it burns too much.  Experiment.  If it helps, use it.  If it burns, don't using it. You also may receive a prescription for   diazepam, and a muscle relaxant to help you to be able to urinate and defecate more easily.  It is safe to take a few doses with the other medications as long as you are not planning to drive or do anything intense.  Hopefully this can minimize the chance of needing a Foley catheter into your bladder   Use a Sitz Bath 4-8 times a day for relief   Sitz Bath A  sitz bath is a warm water bath taken in the sitting position that covers only the hips and buttocks. It may be used for either healing or hygiene purposes. Sitz baths are also used to relieve pain, itching, or muscle spasms. The water may contain medicine. Moist heat will help you heal and relax.  HOME CARE INSTRUCTIONS  Take 3 to 4 sitz baths a day. Fill the bathtub half full with warm water. Sit in the water and open the drain a little. Turn on the warm water to keep the tub half full. Keep the water running constantly. Soak in the water for 15 to 20 minutes. After the sitz bath, pat the affected area dry first.   KEEP YOUR BOWELS REGULAR The goal is one soft bowel movement a day Avoid getting constipated.  Between the surgery and the pain medications, it is common to experience some constipation.  Increasing fluid intake and taking a fiber supplement (such as Metamucil, Citrucel, FiberCon, MiraLax, etc) 2-3 times a day regularly will usually help prevent this problem from occurring.  A mild laxative (prune juice, Milk of Magnesia, MiraLax, etc) should be taken according to package directions if there are no bowel movements after 48 hours. Watch out for diarrhea.  If you have many loose bowel movements, simplify your diet to bland foods & liquids for a few days.  Stop any stool softeners and decrease your fiber supplement.  Switching to mild anti-diarrheal medications (Kayopectate, Pepto Bismol) can help.  Can try an imodium/loperamide dose.  If this worsens or does not improve, please call us.  Wound Care  Remove your bandages with your first bowel movement, usually the day after surgery.  Let the gauze fall off with the first bowel movement or shower.   Wear an absorbent pad or soft cotton balls in your underwear as needed to catch any drainage and help keep the area  Keep the area clean and dry.  Bathe / shower every day.  Keep the area clean by showering / bathing over the incision / wound.    It is okay to soak an open wound to help wash it.  Consider using a squeeze bottle filled with warm water to gently wash the anal area.  Wet wipes or showers / gentle washing after bowel movements is often less traumatic than regular toilet paper. You will often notice bleeding with bowel movements.  This should slow down by the end of the first week of surgery.  Sitting on an ice pack can help. Expect some drainage.  This should slow down by the end of the first week of surgery, but you will have occasional bleeding or drainage up to a few months after surgery.  Wear an absorbent pad or soft cotton gauze in your underwear until the drainage stops.  ACTIVITIES as tolerated:   You may resume regular (light) daily activities beginning the next day--such as daily self-care, walking, climbing stairs--gradually increasing activities as tolerated.  If you can walk 30 minutes without difficulty, it is safe to try more intense activity   such as jogging, treadmill, bicycling, low-impact aerobics, swimming, etc. Save the most intensive and strenuous activity for last such as sit-ups, heavy lifting, contact sports, etc  Refrain from any heavy lifting or straining until you are off narcotics for pain control.   DO NOT PUSH THROUGH PAIN.  Let pain be your guide: If it hurts to do something, don't do it.  Pain is your body warning you to avoid that activity for another week until the pain goes down. You may drive when you are no longer taking prescription pain medication, you can comfortably sit for long periods of time, and you can safely maneuver your car and apply brakes. You may have sexual intercourse when it is comfortable.  FOLLOW UP in our office Please call [facility] at [phone] to set up an appointment to see your surgeon in the office for a follow-up appointment approximately 2-3 weeks after your surgery. Make sure that you call for this appointment the day you arrive home to ensure a convenient appointment  time.  8. IF YOU HAVE DISABILITY OR FAMILY LEAVE FORMS, BRING THEM TO THE OFFICE FOR PROCESSING.  DO NOT GIVE THEM TO YOUR DOCTOR.        WHEN TO CALL US [phone]: Poor pain control Reactions / problems with new medications (rash/itching, nausea, etc)  Fever over 101.5 F (38.5 C) Inability to urinate Nausea and/or vomiting Worsening swelling or bruising Continued bleeding from incision. Increased pain, redness, or drainage from the incision  The clinic staff is available to answer your questions during regular business hours (8:30am-5pm).  Please don't hesitate to call and ask to speak to one of our nurses for clinical concerns.   A surgeon from [facility] is always on call at the hospitals   If you have a medical emergency, go to the nearest emergency room or call 911.    [facility] [address], [address], [place], [place]  [address] ? MAIN: [phone] ? TOLL FREE: [phone] ? FAX [phone] [email or web address]  #####################################################     

## 2021-03-20 NOTE — Op Note (Signed)
03/20/2021  5:28 PM  PATIENT:  Antonio Ayers  70 y.o. male  Patient Care Team: [name], [name], MD as PCP - General (Internal Medicine) Sueanne Margarita, MD as PCP - Cardiology (Cardiology) Viona Gilmore, [facility] as Pharmacist (Pharmacist) Michael Boston, MD as Consulting Physician (General Surgery) Danis, Kirke Corin, MD as Consulting Physician (Gastroenterology) Sueanne Margarita, MD as Consulting Physician (Cardiology) Bo Merino, MD as Consulting Physician (Rheumatology) Irine Seal, MD as Attending Physician (Urology)  PRE-OPERATIVE DIAGNOSIS:  ANAL MASS, HEMORRHOIDS PROLAPSING GRADE 3 WITH BLEEDING  POST-OPERATIVE DIAGNOSIS:   ANAL MASS HEMORRHOIDS PROLAPSING GRADE 3 WITH BLEEDING  PROCEDURE:   REMOVAL OF ANAL POLYP x2 HEMORRHOIDECTOMY WITH LIGATION AND HEMORRHOIDOPEXY ANORECTAL EXAM UNDER ANESTHESIA  SURGEON:  Adin Hector, MD  ANESTHESIA:   General Anorectal & Local field block (0.25% bupivacaine with epinephrine mixed with Liposomal bupivacaine (Experel)   EBL:  Total I/O In: 200 [IV Piggyback:200] Out: - .  See operative record  Delay start of Pharmacological VTE agent (>24hrs) due to surgical blood loss or risk of bleeding:  NO  DRAINS: NONE  SPECIMEN:    ANAL CRYPT POLYPS   x2  DISPOSITION OF SPECIMEN:  PATHOLOGY  COUNTS:  YES  PLAN OF CARE: Discharge home after PACU  PATIENT DISPOSITION:  PACU - hemodynamically stable.  INDICATION: Pleasant patient with struggles with hemorrhoids.  Not able to be managed in the office despite an improved bowel regimen.  I recommended examination under anesthesia and surgical treatment:  The anatomy & physiology of the anorectal region was discussed.  The pathophysiology of hemorrhoids and differential diagnosis was discussed.  Natural history risks without surgery was discussed.   I stressed the importance of a bowel regimen to have daily soft bowel movements to minimize progression of disease.   Interventions such as sclerotherapy & banding were discussed.  The patient's symptoms are not adequately controlled by medicines and other non-operative treatments.  I feel the risks & problems of no surgery outweigh the operative risks; therefore, I recommended surgery to treat the hemorrhoids by ligation, pexy, and possible resection.  Risks such as bleeding, infection, need for further treatment, heart attack, death, and other risks were discussed.   I noted a good likelihood this will help address the problem.  Goals of post-operative recovery were discussed as well.  Possibility that this will not correct all symptoms was explained.  Post-operative pain, bleeding, constipation, urinary difficulties, and other problems after surgery were discussed.  We will work to minimize complications.   Educational handouts further explaining the pathology, treatment options, and bowel regimen were given as well.  Questions were answered.  The patient expresses understanding & wishes to proceed with surgery.  OR FINDINGS: Right posterior midline anal crypt polyp with irregularities correlating with the concerning area by gastroenterology.  Excised with hemorrhoid pile.  Left lateral small anal crypt polyp excised with hemorrhoid as well.  No fissure.  No abscess, no pilonidal disease.  Mild pruritus consistent with intermittent anal rectal prolapse from hemorrhoids and polyp.  DESCRIPTION:   Informed consent was confirmed. Patient underwent general anesthesia without difficulty. Patient was placed into prone positioning.  The perianal region was prepped and draped in sterile fashion. Surgical time-out confirmed our plan.  I did digital rectal examination and then transitioned over to anoscopy to get a sense of the anatomy.  Findings noted above.   I proceeded to do hemorrhoidal ligation and pexy.  I used a 2-0 Vicryl suture on a  UR-6 needle in a figure-of-eight fashion 6 cm proximal to the anal verge.  I  started at the right posterior anal crypt polyp that also had a grade 3 hemorrhoid with some external component.  Largest hemorrhoid pile.  Because of redundant hemorrhoidal tissue too bulky to merely ligate or pexy, I excised the excess internal hemorrhoid piles longitudinally in a fusiform biconcave fashion taking the abnormal anal crypt polyp en bloc, sparing the anal canal to avoid narrowing.  I then ran that stitch longitudinally more distally to close the hemorrhoidectomy wound to the anal verge over a large Parks self-retaining anal retractor to avoid narrowing of the anal canal.  I then tied that stitch down to cause a hemorrhoidopexy.   I also had to do an excision at the  left lateral pile location given the large hemorrhoid and inflamed anal crypt polyp there.  Much smaller and more benign-appearing there..  I then did hemorrhoidal ligation and pexy at the other 4 hemorrhoidal columns.  At the completion of this, all 6 anorectal columns were ligated and pexied in the classic hexagonal fashion (right anterior/lateral/posterior, left anterior/lateral/posterior).  I closed the external part of the hemorrhoidectomy wounds with radial interrupted horizontal mattress 2-0 chromic suture over a large Sawyer anal retractor, leaving the last 5 mm open to allow natural drainage.    I redid anoscopy & examination.  At completion of this, all hemorrhoids had been removed or reduced into the rectum.  There is no more prolapse.  Internal & external anatomy was more more normal.  Hemostasis was good.  Fluffed gauze was on-laid over the perianal region.  No packing done.  Patient is being extubated go to go to the recovery room.  I had discussed postop care in detail with the patient in the preop holding area.  Instructions for post-operative recovery and prescriptions are written. I discussed operative findings, updated the patient's status, discussed probable steps to recovery, and gave postoperative recommendations  to the  patient's niece .  Recommendations were made.  Questions were answered.  She expressed understanding & appreciation.  Adin Hector, M.D., F.A.C.S. Gastrointestinal and Minimally Invasive Surgery [facility] [address], [place], [place] [address] [phone] Main / Paging

## 2021-03-20 NOTE — Anesthesia Postprocedure Evaluation (Signed)
Anesthesia Post Note  Patient: Antonio Ayers  Procedure(s) Performed: REMOVAL OF ANAL polyp x2 HEMORRHOIDECTOMY WITH LIGATION AND HEMORRHOIDOPEXY ANORECTAL EXAM UNDER ANESTHESIA     Patient location during evaluation: PACU Anesthesia Type: General Level of consciousness: awake and alert, oriented and awake Pain management: pain level controlled Vital Signs Assessment: post-procedure vital signs reviewed and stable Respiratory status: spontaneous breathing, nonlabored ventilation and respiratory function stable Cardiovascular status: blood pressure returned to baseline and stable Postop Assessment: no apparent nausea or vomiting Anesthetic complications: no   No notable events documented.  Last Vitals:  Vitals:   03/20/21 1830 03/20/21 1849  BP:  (!) 140/95  Pulse: 72   Resp: 14 14  Temp:    SpO2: 99%     Last Pain:  Vitals:   03/20/21 1849  TempSrc:   PainSc: 5                  Catalina Gravel

## 2021-03-20 NOTE — Interval H&P Note (Signed)
History and Physical Interval Note:  03/20/2021 3:48 PM  Antonio Ayers  has presented today for surgery, with the diagnosis of ANAL MASS, HEMORRHOIDS PROLAPSING GRADE 3 WITH BLEEDING.  The various methods of treatment have been discussed with the patient and family. After consideration of risks, benefits and other options for treatment, the patient has consented to  Procedure(s): REMOVAL OF ANAL MASS (N/A) HEMORRHOIDECTOMY WITH LIGATION AND HEMORRHOIDOPEXY (N/A) ANORECTAL EXAM UNDER ANESTHESIA (N/A) as a surgical intervention.  The patient's history has been reviewed, patient examined, no change in status, stable for surgery.  I have reviewed the patient's chart and labs.  Questions were answered to the patient's satisfaction.    I have re-reviewed the the patient's records, history, medications, and allergies.  I have re-examined the patient.  I again discussed intraoperative plans and goals of post-operative recovery.  The patient agrees to proceed.  Antonio Ayers  07/04/1951 [id number]  Patient Care Team: [name], [name], MD as PCP - General (Internal Medicine) Sueanne Margarita, MD as PCP - Cardiology (Cardiology) Viona Gilmore, [facility] as Pharmacist (Pharmacist) Michael Boston, MD as Consulting Physician (General Surgery) Lula, Kirke Corin, MD as Consulting Physician (Gastroenterology) Sueanne Margarita, MD as Consulting Physician (Cardiology) Bo Merino, MD as Consulting Physician (Rheumatology) Irine Seal, MD as Attending Physician (Urology)  Patient Active Problem List   Diagnosis Date Noted   Agatston coronary artery calcium score greater than 400    Aortic aneurysm [facility])    Aortic valve regurgitation 01/09/2019   Nephrolithiasis 01/09/2019   BPH (benign prostatic hyperplasia) 01/09/2019   Overweight (BMI 25.0-29.9) 01/09/2019   Primary osteoarthritis of both hips 12/29/2018   Primary osteoarthritis of both knees 12/29/2018   Raised intraocular  pressure of both eyes 12/19/2018   Ankylosing spondylitis of multiple sites in spine ([facility]) 12/19/2018   Hiatal hernia with GERD 11/08/2018   History of colonoscopy 01/07/2018   Gall stone in bile duct with infection of gallbladder 12/14/2017   Hernia of abdominal cavity 12/07/2016   Dislocated elbow, left, initial encounter 10/07/2016   Enlarged liver 12/08/2006   Aortic anomaly 01/07/2005   Fracture 02/17/2004   Keratoconus 12/07/1977    Past Medical History:  Diagnosis Date   Abdominal hernia    Agatston coronary artery calcium score greater than 400    coronary Ca 454 on CT 2021   Ankylosing spondylitis ([facility])    Aortic aneurysm ([facility])    mildly dilated ascending aorta at 55m and aortic root 373mby echo 03/2020   Aortic insufficiency    Chronic diarrhea    Colon polyp    Complication of anesthesia    GERD (gastroesophageal reflux disease)    Headache    Heart murmur    History of hiatal hernia    History of kidney stones    PONV (postoperative nausea and vomiting)     Past Surgical History:  Procedure Laterality Date   CATARACT EXTRACTION Left 2016   CHOLECYSTECTOMY  01/2018   COLONOSCOPY     02/2015   CORNEAL TRANSPLANT  1989, 1992, 2014   HERNIA REPAIR  2018   HIATAL HERNIA REPAIR     KIDNEY STONE SURGERY  03/2018   PROSTATE BIOPSY     x3   UPPER GASTROINTESTINAL ENDOSCOPY      Social History   Socioeconomic History   Marital status: Single    Spouse name: Not on file   Number of children: Not on file   Years of education: Not  on file   Highest education level: Not on file  Occupational History   Occupation: retired   Tobacco Use   Smoking status: Never   Smokeless tobacco: Never  Vaping Use   Vaping Use: Never used  Substance and Sexual Activity   Alcohol use: Yes    Comment: Occasionally beer   Drug use: Never   Sexual activity: Not on file  Other Topics Concern   Not on file  Social History Narrative   Not on file   Social Determinants of  Health   Financial Resource Strain: Not on file  Food Insecurity: Not on file  Transportation Needs: Not on file  Physical Activity: Not on file  Stress: Not on file  Social Connections: Not on file  Intimate Partner Violence: Not on file    Family History  Problem Relation Age of Onset   Stroke Mother    Stroke Father    Heart failure Father    Diabetes Sister    Obesity Sister    Hiatal hernia Sister    Colon cancer Neg Hx    Esophageal cancer Neg Hx    Rectal cancer Neg Hx    Stomach cancer Neg Hx     Medications Prior to Admission  Medication Sig Dispense Refill Last Dose   acetaminophen (TYLENOL) 325 MG tablet Take 325 mg by mouth every 6 (six) hours as needed for mild pain or moderate pain.   Past Week   Adalimumab (HUMIRA PEN) 40 MG/0.4ML PNKT Inject 40 mg into the skin every 21 ( twenty-one) days. 4 each 0 03/09/2021   atorvastatin (LIPITOR) 20 MG tablet Take 1 tablet (20 mg total) by mouth daily. 90 tablet 3 03/20/2021 at 0730   brimonidine-timolol (COMBIGAN) 0.2-0.5 % ophthalmic solution Place 1 drop into both eyes 2 (two) times daily.   03/20/2021 at 0730   Cholecalciferol 25 MCG (1000 UT) capsule Take 1,000 Units by mouth daily.    03/20/2021 at 0730   indapamide (LOZOL) 1.25 MG tablet Take 1.25 mg by mouth daily. Pt takes in the pm   03/19/2021 at 1900   latanoprost (XALATAN) 0.005 % ophthalmic solution Place 1 drop into both eyes at bedtime.   03/19/2021 at 1900   LOTEMAX 0.5 % GEL Place 1 drop into the left eye daily.   03/20/2021 at 0730   polyethylene glycol (MIRALAX / GLYCOLAX) 17 g packet Take 17 g by mouth daily as needed (Constipation).   03/19/2021 at 1030   potassium citrate (UROCIT-K) 10 MEQ (1080 MG) SR tablet Take 10 mEq by mouth 3 (three) times daily.   03/20/2021 at 0730    Current Facility-Administered Medications  Medication Dose Route Frequency Provider Last Rate Last Admin   bupivacaine liposome (EXPAREL) 1.3 % injection 266 mg  20 mL Infiltration Once  Michael Boston, MD       cefTRIAXone (ROCEPHIN) 2 g in sodium chloride 0.9 % 100 mL IVPB  2 g Intravenous On Call to OR Michael Boston, MD       And   metroNIDAZOLE (FLAGYL) IVPB 500 mg  500 mg Intravenous On Call to OR Michael Boston, MD       Derrill Memo ON 03/21/2021] feeding supplement (ENSURE PRE-SURGERY) liquid 296 mL  296 mL Oral Once Michael Boston, MD       lactated ringers infusion   Intravenous Continuous Nolon Nations, MD 10 mL/hr at 03/20/21 1313 Continued from Pre-op at 03/20/21 1313     No Known Allergies  BP Marland Kitchen)  138/91   Pulse 71   Temp 98.4 F (36.9 C) (Oral)   Resp 16   Ht '6\' 1"'$  (1.854 m)   Wt 105.7 kg   SpO2 100%   BMI 30.74 kg/m   Labs: No results found for this or any previous visit (from the past 48 hour(s)).  Imaging / Studies: No results found.   Adin Hector, M.D., F.A.C.S. Gastrointestinal and Minimally Invasive Surgery [facility] [address], [place], [place] [address] [phone] Main / Paging  03/20/2021 3:48 PM    Adin Hector

## 2021-03-20 NOTE — H&P (Signed)
03/20/2021     REFERRING PHYSICIAN: Lorella Nimrod,*  Patient Care Team: [name], Althia Forts, MD as PCP - General (Internal Medicine)  PROVIDER: Hollace Kinnier, MD  [facility] MRN: [id number] DOB: 10/19/1950 DATE OF ENCOUNTER: 03/11/2021  Subjective   Chief Complaint: surgery consult   History of Present Illness: Antonio Ayers is a 70 y.o. male who is seen today as an office consultation at the request of Dr. Loletha Carrow for evaluation of surgery consult .   Patient with a concerning anal rectal lesion according to his gastroenterologist. And had rectal bleeding. Colonoscopy confirmed a distal anorectal lesion that was friable. This was on colonoscopy 11/06/2020. Sent for consultation. I saw him 12/15/2020. He had an atypical anorectal lesion I recommended outpatient surgery to remove. Perhaps address a hemorrhoid as well. Wanted cardiac clearance. Patient had a lot of questions. Wanted to see if another partner group would review my notes to see if they agreed. That is not our standard. Then stated and did looks like he went to [facility] and they recommended excision as well. He was due to have surgery within June but he had concerns and canceled. Then he wished to reschedule but wish to talk more. Looks like he was cleared by cardiology.  Patient comes in today just to go over the plan and recommendations. He wanted to talk in person. He denies any worsening pain or bleeding. No fevers or chills. Had many questions about surgical technique, postoperative recommendations, etc. denies any severe constipation or diarrhea. No fevers or chills. No weight loss. Or worsening anorectal pain  Medical History: Past Medical History:  Diagnosis Date   GERD (gastroesophageal reflux disease)   Kidney stones   Patient Active Problem List  Diagnosis   Anxiety   Prolapsed internal hemorrhoids, grade 3   Anorectal polyp   Hiatal hernia   BPH (benign prostatic hyperplasia)    Constipation   Fatty liver disease, nonalcoholic   Overweight (BMI 25.0-29.9)   Tubular adenoma of colon, unspecified   S/P laparoscopic cholecystectomy   Past Surgical History:  Procedure Laterality Date   CHOLECYSTECTOMY   COLON SURGERY   HERNIA REPAIR   kidney stone N/A    No Known Allergies  Current Outpatient Medications on File Prior to Visit  Medication Sig Dispense Refill   adalimumab (HUMIRA,CF, PEN) 40 mg/0.4 mL pen injector kit Inject subcutaneously   brimonidine-timoloL (COMBIGAN) 0.2-0.5 % ophthalmic solution Apply 1 drop to eye 2 (two) times daily   indapamide (LOZOL) 1.25 MG tablet Take by mouth   latanoprost (XALATAN) 0.005 % ophthalmic solution Place 1 drop into the left eye at bedtime.   atorvastatin (LIPITOR) 20 MG tablet   loteprednol (LOTEMAX) 0.5 % ophthalmic suspension Place 1 drop into the left eye once daily   polyethylene glycol (MIRALAX) powder Take by mouth   potassium citrate (UROCIT-K) 10 mEq ER tablet Take 10 mEq by mouth 3 (three) times daily   No current facility-administered medications on file prior to visit.   Family History  Problem Relation Age of Onset   Stroke Mother   Stroke Father   Heart failure Father   Obesity Sister   Diabetes Sister    Social History   Tobacco Use  Smoking Status Current Some Day Smoker  Smokeless Tobacco Never Used    Social History   Socioeconomic History   Marital status: Single  Tobacco Use   Smoking status: Current Some Day Smoker   Smokeless tobacco: Never Used  Substance  and Sexual Activity   Alcohol use: Yes   Drug use: Never   ############################################################  Fraility Risk:  Preoperative Risks/Screening 1. Frailty Review:  Lives independently? yes Uses a mobility assist device (cane/walker/wheelchair)? no History of falls within 3 months? no Cognitive impairment/dementia? no Age > 65? yes  2. Nutrition Screening: Cancer/IBD? no Age >65?  yes Weight loss >10% in past 6 months? no If yes to any of the 3 above, consider Impact AR supplemental shake  3. PONV Screening: History of PONV? no Male under age of 66? no History of motion sickness? no  4. Chronic pain issues? no  5. Diabetes? no Last HgbA1c: No results found for: HGBA1C  Review of Systems: A complete review of systems (ROS) was obtained from the patient. I have reviewed this information and discussed as appropriate with the patient. See HPI as well for other pertinent ROS.  Constitutional: No fevers, chills, sweats. Weight stable Eyes: No vision changes, No discharge HENT: No sore throats, nasal drainage Lymph: No neck swelling, No bruising easily Pulmonary: No cough, productive sputum CV: No orthopnea, PND Patient walks 20 minutes for about 1/2 miles without difficulty. No exertional chest/neck/shoulder/arm pain.  GI: No personal nor family history of GI/colon cancer, inflammatory bowel disease, irritable bowel syndrome, allergy such as Celiac Sprue, dietary/dairy problems, colitis, ulcers nor gastritis. No recent sick contacts/gastroenteritis. No travel outside the country. No changes in diet.  Renal: No UTIs, No hematuria Genital: No drainage, bleeding, masses Musculoskeletal: No severe joint pain. Good ROM major joints Skin: No sores or lesions Heme/Lymph: No easy bleeding. No swollen lymph nodes  Objective:   Vitals:  03/11/21 1058  BP: 98/72  Pulse: 76  Temp: 36 C (96.8 F)  SpO2: 98%  Weight: 100.3 kg (221 lb 3.2 oz)  Height: 185.4 cm ('6\' 1"' )   03/20/2021 BP (!) 138/91   Pulse 71   Temp 98.4 F (36.9 C) (Oral)   Resp 16   Ht '6\' 1"'  (1.854 m)   Wt 105.7 kg   SpO2 100%   BMI 30.74 kg/m   Body mass index is 29.18 kg/m.  PHYSICAL EXAM:  Constitutional: Not cachectic. Hygeine adequate. Vitals signs as above.  Eyes: Pupils reactive, normal extraocular movements. Sclera nonicteric Neuro: CN II-XII intact. No major focal sensory  defects. No major motor deficits. Lymph: No head/neck/groin lymphadenopathy Psych: No severe agitation. Some mild anxiety but consolable. No severe anxiety. Judgment & insight Adequate, Oriented x4, HENT: Normocephalic, Mucus membranes moist. No thrush.  Neck: Supple, No tracheal deviation. No obvious thyromegaly Chest: No pain to chest wall compression. Good respiratory excursion. No audible wheezing CV: Pulses intact. Regular rhythm. No major extremity edema  Abdomen: Flat Hernia: . Diastasis recti: . Soft. Nondistended. Nontender. No hepatomegaly. No splenomegaly  Gen: Inguinal hernia: . Inguinal lymph nodes: .   Rectal: (Deferred)  Ext: No obvious deformity or contracture. Edema: Not present. No cyanosis Skin: No major subcutaneous nodules. Warm and dry Musculoskeletal: Severe joint rigidity not present. No obvious clubbing. No digital petechiae.   Labs, Imaging and Diagnostic Testing:  Located in Page' section of Epic EMR chart  PRIOR NOTES   NAMIR NETO Appointment: 12/15/2020 9:30 AM Location: [facility] Patient #: [id number] DOB: 1951/07/14 Single / Language: Cleophus Molt / Race: White Male  History of Present Illness Adin Hector MD; 12/15/2020 12:41 PM) The patient is a 70 year old male who presents with anal lesions. Note for "Anal lesions": ` ` ` Patient sent for  surgical consultation at the request of Dr Lance Sell GI  Chief Complaint: Rectal bleeding with anal lesion. ` ` The patient is a pleasant gentleman with multiple medical problems recently relocated from [place].  He has ankylosing spondylosis immunosuppressed on Humira followed by rheumatology.  Has had some aortic insufficiency and coronary calcifications are relatively mild.  Followed by cardiology.  He had episode of significant rectal bleeding that scared him.  He went to the emergency room in January.  They recommended outpatient gastrology follow-up.  His  gastrologist recommended colonoscopy given his age.  A few adenomatous polyps removed but enlarged hemorrhoids noted.  Atypical mass in the anal canal noted.  Did not seem like an obvious cancer but not normal.  Surgical consultation offered.  Patient comes a by himself.  He usually moves his bowels once or twice a day.  He recall some surgery done in his left anterior inner buttock.  Perhaps a mass removed.  Cannot tell me if he had a fissure or fistula or cancer warts.  He is on Humira for ankylosing spondylosis.  He's been stable on that for 3 years.  He had a hiatal hernia that required repair and fundoplication and cholecystectomy by Dr. Rodrigo Ran at [facility] in [place].  No other abdominal surgeries.  He claims he can walk about 4 blocks before he has to stop.  Denies sleep apnea but does have some issue sleeping.  He does note he has moderate nocturia were he gets up several times a night to go.  He is followed by Dr. Jeffie Pollock with the right energy and he is on some type of prostate medicine.  Not Flomax.  Coagulation.  Not even aspirin at this time.  He still gets blood when he wipes.  No personal nor family history of GI/colon cancer, inflammatory bowel disease, irritable bowel syndrome, allergy such as Celiac Sprue, dietary/dairy problems, colitis, ulcers nor gastritis.  No recent sick contacts/gastroenteritis.  No travel outside the country.  No changes in diet.  No dysphagia to solids or liquids.  No significant heartburn or reflux.  No melena, hematemesis, coffee ground emesis.  No evidence of prior gastric/peptic ulceration.  (Review of systems as stated in this history (HPI) or in the review of systems.  Otherwise all other 12 point ROS are negative) ` ` ###########################################`  This patient encounter took 45 minutes today to perform the following: obtain history, perform exam, review outside records, interpret tests & imaging, counsel the patient on their  diagnosis; and, document this encounter, including findings & plan in the electronic health record (EHR).  Past Surgical History Janeann Forehand, CNA; 12/15/2020 9:36 AM) Cataract Surgery   Left. Oral Surgery    Allergies Janeann Forehand, CNA; 12/15/2020 9:37 AM) No Known Drug Allergies   [12/15/2020]: Allergies Reconciled    Medication History Janeann Forehand, CNA; 12/15/2020 9:39 AM) Atorvastatin Calcium  (20MG Tablet, Oral) Active. Combigan  (0.2-0.5% Solution, Ophthalmic) Active. Indapamide  (1.25MG Tablet, Oral) Active. Latanoprost  (0.005% Solution, Ophthalmic) Active. Lotemax  (0.5% Gel, Ophthalmic) Active. Loteprednol Etabonate  (0.5% Suspension, Ophthalmic) Active. Ofloxacin  (0.3% Solution, Ophthalmic) Active. Plenvu  (140GM For Solution, Oral) Active. Potassium Citrate ER  (10 MEQ(1080 MG) Tablet ER, Oral) Active. Humira  (10MG/0.1ML Prefill Syr Kit, Subcutaneous) Active. Medications Reconciled   Social History Janeann Forehand, CNA; 12/15/2020 9:36 AM) Alcohol use   Occasional alcohol use. Caffeine use   Coffee. Illicit drug use   Uses socially only. Tobacco use   Never smoker.  Family History Janeann Forehand, CNA; 12/15/2020 9:36 AM) Heart Disease   Father, Mother.  Other Problems Janeann Forehand, CNA; 12/15/2020 9:36 AM) Arthritis   Gastroesophageal Reflux Disease   Heart murmur   Inguinal Hernia   Kidney Stone    Review of Systems Janeann Forehand CNA; 12/15/2020 9:36 AM) General Not Present- Appetite Loss, Chills, Fatigue, Fever, Night Sweats, Weight Gain and Weight Loss. Skin Not Present- Change in Wart/Mole, Dryness, Hives, Jaundice, New Lesions, Non-Healing Wounds, Rash and Ulcer. HEENT Present- Wears glasses/contact lenses. Not Present- Earache, Hearing Loss, Hoarseness, Nose Bleed, Oral Ulcers, Ringing in the Ears, Seasonal Allergies, Sinus Pain, Sore Throat, Visual Disturbances and Yellow Eyes. Respiratory Not Present- Bloody sputum, Chronic Cough, Difficulty  Breathing, Snoring and Wheezing. Breast Not Present- Breast Mass, Breast Pain, Nipple Discharge and Skin Changes. Cardiovascular Present- Swelling of Extremities. Not Present- Chest Pain, Difficulty Breathing Lying Down, Leg Cramps, Palpitations, Rapid Heart Rate and Shortness of Breath. Gastrointestinal Present- Constipation and Excessive gas. Not Present- Abdominal Pain, Bloating, Bloody Stool, Change in Bowel Habits, Chronic diarrhea, Difficulty Swallowing, Gets full quickly at meals, Hemorrhoids, Indigestion, Nausea, Rectal Pain and Vomiting. Male Genitourinary Not Present- Blood in Urine, Change in Urinary Stream, Frequency, Impotence, Nocturia, Painful Urination, Urgency and Urine Leakage. Musculoskeletal Not Present- Back Pain, Joint Pain, Joint Stiffness, Muscle Pain, Muscle Weakness and Swelling of Extremities. Neurological Not Present- Decreased Memory, Fainting, Headaches, Numbness, Seizures, Tingling, Tremor, Trouble walking and Weakness. Psychiatric Not Present- Anxiety, Bipolar, Change in Sleep Pattern, Depression, Fearful and Frequent crying. Endocrine Not Present- Cold Intolerance, Excessive Hunger, Hair Changes, Heat Intolerance, Hot flashes and New Diabetes. Hematology Not Present- Blood Thinners, Easy Bruising, Excessive bleeding, Gland problems, HIV and Persistent Infections.  Vitals (Donyelle Alston CNA; 12/15/2020 9:40 AM) 12/15/2020 9:40 AM Weight: 227 lb   Height: 73 in  Body Surface Area: 2.27 m   Body Mass Index: 29.95 kg/m   Temp.: 98.3 F    Pulse: 84 (Regular)    P.OX: 93% (Room air) BP: 120/80(Sitting, Left Arm, Standard)  Physical Exam Adin Hector MD; 12/15/2020 10:18 AM) General Mental Status - Alert. General Appearance - Not in acute distress, Not Sickly. Orientation - Oriented X3. Hydration - Well hydrated. Voice - Normal.  Integumentary Global Assessment Upon inspection and palpation of skin surfaces of the - Axillae: non-tender, no inflammation or  ulceration, no drainage. and Distribution of scalp and body hair is normal. General Characteristics Temperature - normal warmth is noted.  Head and Neck Head - normocephalic, atraumatic with no lesions or palpable masses. Face Global Assessment - atraumatic, no absence of expression. Neck Global Assessment - no abnormal movements, no bruit auscultated on the right, no bruit auscultated on the left, no decreased range of motion, non-tender. Trachea - midline. Thyroid Gland Characteristics - non-tender.  Eye Eyeball - Left - Extraocular movements intact, No Nystagmus - Left. Eyeball - Right - Extraocular movements intact, No Nystagmus - Right. Cornea - Left - No Hazy - Left. Cornea - Right - No Hazy - Right. Sclera/Conjunctiva - Left - No scleral icterus, No Discharge - Left. Sclera/Conjunctiva - Right - No scleral icterus, No Discharge - Right. Pupil - Left - Direct reaction to light normal. Pupil - Right - Direct reaction to light normal. Note:  Wears glasses. Vision corrected  ENMT Ears Pinna - Left - no drainage observed, no generalized tenderness observed. Pinna - Right - no drainage observed, no generalized tenderness observed. Nose and Sinuses External Inspection of the Nose - no destructive  lesion observed. Inspection of the nares - Left - quiet respiration. Inspection of the nares - Right - quiet respiration. Mouth and Throat Lips - Upper Lip - no fissures observed, no pallor noted. Lower Lip - no fissures observed, no pallor noted. Nasopharynx - no discharge present. Oral Cavity/Oropharynx - Tongue - no dryness observed. Oral Mucosa - no cyanosis observed. Hypopharynx - no evidence of airway distress observed.  Chest and Lung Exam Inspection Movements - Normal and Symmetrical. Accessory muscles - No use of accessory muscles in breathing. Palpation Palpation of the chest reveals - Non-tender. Auscultation Breath sounds - Normal and  Clear.  Cardiovascular Auscultation Rhythm - Regular. Murmurs & Other Heart Sounds - Auscultation of the heart reveals - No Murmurs and No Systolic Clicks.  Abdomen Inspection Inspection of the abdomen reveals - No Visible peristalsis and No Abnormal pulsations. Umbilicus - No Bleeding, No Urine drainage. Palpation/Percussion Palpation and Percussion of the abdomen reveal - Soft, Non Tender, No Rebound tenderness, No Rigidity (guarding) and No Cutaneous hyperesthesia. Note:  Abdomen soft.  Nontender.  Mild diastases recti. Small umbilical hernia at the base of his flat. Only. Not distended.  No incisional hernias.  No guarding.  Male Genitourinary Sexual Maturity Tanner 5 - Adult hair pattern and Adult penile size and shape. Note:  No inguinal hernias. Normal external genitalia. Epididymi, testes, and spermatic cords normal without any masses.  Rectal Note:  Please refer to anoscopy.  Perianal skin clean with good hygiene. A few external hemorrhoidal tags. Left anterior perirectal intergluteal fat with some swelling in she will vertical scar. Incision with prior soft tissue excision. No fistula or sinus tract. No pruritis ani. No pilonidal disease. No fissure. No abscess/fistula. Normal sphincter tone. No condyloma warts.  Tolerates digital and anoscopic rectal exam. Enlarged grade 2 internal hemorrhoids friable left lateral. Largest his right posterior with a polypoid mass at its tip. Somewhat friable and adenomatous. Does not look like a smooth epithelialized anal crypt polyp. No definite fibrosis but can prolapse out consistent with grade 3. Prostate smooth but enlarged. No major proctitis. Right anterior looks grade 2.  Peripheral Vascular Upper Extremity Inspection - Left - No Cyanotic nailbeds - Left, Not Ischemic. Inspection - Right - No Cyanotic nailbeds - Right, Not Ischemic.  Neurologic Neurologic evaluation reveals  - normal attention span and ability to concentrate, able to  name objects and repeat phrases. Appropriate fund of knowledge , normal sensation and normal coordination. Mental Status Affect - not angry, not paranoid. Cranial Nerves - Normal Bilaterally. Gait - Normal.  Neuropsychiatric Mental status exam performed with findings of - able to articulate well with normal speech/language, rate, volume and coherence, thought content normal with ability to perform basic computations and apply abstract reasoning and no evidence of hallucinations, delusions, obsessions or homicidal/suicidal ideation.  Musculoskeletal Global Assessment Spine, Ribs and Pelvis - no instability, subluxation or laxity. Right Upper Extremity - no instability, subluxation or laxity. Note:  Moderate thoracic kyphosis.  Lymphatic Head & Neck  General Head & Neck Lymphatics: Bilateral - Description - No Localized lymphadenopathy. Axillary  General Axillary Region: Bilateral - Description - No Localized lymphadenopathy. Femoral & Inguinal  Generalized Femoral & Inguinal Lymphatics: Left - Description - No Localized lymphadenopathy. Right - Description - No Localized lymphadenopathy.  Results Adin Hector MD; 12/15/2020 12:44 PM) Procedures  Name Value Date Hemorrhoids Procedure   Other: Please refer to anoscopy............Marland KitchenPerianal skin clean with good hygiene.  A few external hemorrhoidal tags.  Left anterior perirectal  intergluteal fat with some swelling in she will vertical scar.  Incision with prior soft tissue excision.  No fistula or sinus tract.  No pruritis ani.  No pilonidal disease.  No fissure.  No abscess/fistula.  Normal sphincter tone.  ....Marland KitchenMarland KitchenNo condyloma warts............Marland KitchenTolerates digital and anoscopic rectal exam.  Enlarged grade 2 internal hemorrhoids friable left lateral.  Largest his right posterior with a polypoid mass at its tip.  Somewhat friable and adenomatous.  Does not look like a smooth epithelialized anal crypt polyp.  No definite fibrosis  but can prolapse out consistent with grade 3.  Prostate smooth but enlarged.  No major proctitis.  Right anterior looks grade 2.  Performed: 12/15/2020 10:19 AM  Assessment & Plan Adin Hector MD; 12/15/2020 12:41 PM) POLYP OF ANAL VERGE (K62.0) Impression: Polypoid mass sitting on his right posterior hemorrhoid that is friable & bleeding. Does not look like a simple epithelialize anal crypt polyp. This all started with a bleeding events significant for him to be evaluated in the emergency department in January. I recommended removal with outpatient surgery.  This this will allow an opportunity for more thorough any rectal examination under anesthesia with hemorrhoidal ligation & pexy. Perhaps remove the left lateral hemorrhoid as well since it is angry and inflamed. Hopefully give the best chance to address his rectal bleeding issues since colonoscopy was otherwise underwhelming aside from a few small adenomatous polyps  We'll double check with his cardiologist that he is not a significant risk to surgery. He just saw Dr. Radford Pax in February with some numerous but mostly mild cardiac issues. His performance status is not great but it's okay and he got over major surgery just a few years ago when he was in [place].  He is leaning towards surgery to get rid of the suspicious lesion to make sure it is not adenomatous polyp something else of concern. I cautioned that he will need help especially the first few weeks and pain meds as this is an uncomfortable surgery Current Plans ANOSCOPY, DIAGNOSTIC (69629) You are being scheduled for surgery - Our schedulers will call you.   You should hear from our office's scheduling department within 5 working days about the location, date, and time of surgery.  We try to make accommodations for patient's preferences in scheduling surgery, but sometimes the OR schedule or the surgeon's schedule prevents [name] from making those accommodations.  If you have not heard  from our office [phone]) in 5 working days, call the office and ask for your surgeon's nurse.  If you have other questions about your diagnosis, plan, or surgery, call the office and ask for your surgeon's nurse.  ENCOUNTER FOR PREOPERATIVE EXAMINATION FOR GENERAL SURGICAL PROCEDURE (Z01.818) Current Plans You are being scheduled for surgery - Our schedulers will call you.   You should hear from our office's scheduling department within 5 working days about the location, date, and time of surgery.  We try to make accommodations for patient's preferences in scheduling surgery, but sometimes the OR schedule or the surgeon's schedule prevents [name] from making those accommodations.  If you have not heard from our office [phone]) in 5 working days, call the office and ask for your surgeon's nurse.  If you have other questions about your diagnosis, plan, or surgery, call the office and ask for your surgeon's nurse.  Pt Education - [facility] Rectal Prep for Anorectal outpatient/office surgery: discussed with patient and provided information. Pt Education - [facility] Rectal Surgery HCI (Larin Depaoli): discussed with patient  and provided information. PROLAPSED INTERNAL HEMORRHOIDS, GRADE 3 (K64.2) Current Plans Pt Education - Pamphlet Given - The Hemorrhoid Book: discussed with patient and provided information. The anatomy & physiology of the anorectal region was discussed.  The pathophysiology of hemorrhoids and differential diagnosis was discussed.  Natural history risks without surgery was discussed.   I stressed the importance of a bowel regimen to have daily soft bowel movements to minimize progression of disease.  Interventions such as sclerotherapy & banding were discussed.  The patient's symptoms are not adequately controlled by medicines and other non-operative treatments.  I feel the risks & problems of no surgery outweigh the operative risks; therefore, I recommended surgery to treat the hemorrhoids by  ligation, pexy, and possible resection.   Risks such as bleeding, infection, urinary difficulties, need for further treatment, heart attack, death, and other risks were discussed.   I noted a good likelihood this will help address the problem.  Goals of post-operative recovery were discussed as well.  Possibility that this will not correct all symptoms was explained.  Post-operative pain, bleeding, constipation, and other problems after surgery were discussed.  We will work to minimize complications.   Educational handouts further explaining the pathology, treatment options, and bowel regimen were given as well.  Questions were answered.  The patient expresses understanding & wishes to proceed with surgery.  INTERNAL BLEEDING HEMORRHOIDS (K64.8) Impression: History of bright red rectal bleeding. Most likely hemorrhoidal in etiology. Recommend surgery for ligation and removal of suspicious areas ANKYLOSING SPONDYLITIS OF CERVICOTHORACIC REGION (M45.3) IMMUNOSUPPRESSION DUE TO DRUG THERAPY (Z66.294) Impression: Chronically any suppressed on Humira for his ankylosing spondylosis. Followed by Dr. 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  I am concerned about the health of the patient and the ability to tolerate the operation.  Therefore, we will request clearance by cardiology to better assess operative risk & see if a reevaluation, further workup, etc is needed.  Also recommendations on how medications such as for anticoagulation and blood pressure should be managed/held/restarted after surgery. CALCIFIC CORONARY ARTERIOSCLEROSIS (I25.10) NOCTURIA (R35.1) Impression: Nocturia followed by Dr. Jeffie Pollock with urology. I believe he is on some type of prostate medication. He cannot recall exactly what it is.  His risk her urinary retention is increased but hopefully since he's  followed by urology and already on a BPH medicine, we have minimize that.  Signed electronically by Adin Hector, MD (12/15/2020 12:45 PM)  SURGERY NOTES:  Not applicable  PATHOLOGY:  Not applicable  Assessment and Plan:  DIAGNOSES:  Diagnoses and all orders for this visit:  Anorectal polyp  Prolapsed internal hemorrhoids, grade 3  Anxiety    ASSESSMENT/PLAN  Patient with atypical distal anorectal mass resting on hemorrhoid. Possible AN or atypical hemorrhoid. I doubt anal cancer but abnormal.  I again explained the reasoning for surgery. I again recommended surgery and send it to pathology. He looks like he has an irritated hemorrhoid as well that I would do with ligation or possible concurrent hemorrhoidectomy. Explained my technique in detail again. Worked to address his preoperative and postoperative questions. He seemed mostly reassured. He often asked repeated questions but seem to be mostly consolable. I think he feels more comfortable with proceeding with surgery as scheduled. He asked that we print out his instructions yet again. Hopefully he will not get confused since he has a large pile of them.  We will try and have my office call  him a week before surgery, next week, to make sure that he feels comfortable with proceeding. Keep the appointment for anesthesia discussed airway concerns and other issues.  FOLLOWUP: Return for Call with update on your progess in 2 weeks.  ########################################################  Adin Hector, MD, FACS, MASCRS Esophageal, Gastrointestinal & Colorectal Surgery Robotic and Minimally Invasive Surgery  [facility], [place]  [place]. [address] [place], [place] [address] [phone] Fax [phone] Main

## 2021-03-20 NOTE — Anesthesia Procedure Notes (Signed)
Procedure Name: Intubation Date/Time: 03/20/2021 4:17 PM Performed by: Gerald Leitz, CRNA Pre-anesthesia Checklist: Patient identified, Patient being monitored, Timeout performed, Emergency Drugs available and Suction available Patient Re-evaluated:Patient Re-evaluated prior to induction Oxygen Delivery Method: Circle system utilized Preoxygenation: Pre-oxygenation with 100% oxygen Induction Type: IV induction Ventilation: Mask ventilation without difficulty Laryngoscope Size: Mac and 3 Grade View: Grade I Tube type: Oral Tube size: 7.5 mm Number of attempts: 1 Airway Equipment and Method: Stylet and Video-laryngoscopy Placement Confirmation: ETT inserted through vocal cords under direct vision, positive ETCO2 and breath sounds checked- equal and bilateral Secured at: 23 cm Tube secured with: Tape Dental Injury: Teeth and Oropharynx as per pre-operative assessment  Difficulty Due To: Difficulty was unanticipated

## 2021-03-20 NOTE — Transfer of Care (Signed)
Immediate Anesthesia Transfer of Care Note  Patient: Antonio Ayers  Procedure(s) Performed: Procedure(s): REMOVAL OF ANAL polyp x2 (N/A) HEMORRHOIDECTOMY WITH LIGATION AND HEMORRHOIDOPEXY (N/A) ANORECTAL EXAM UNDER ANESTHESIA (N/A)  Patient Location: PACU  Anesthesia Type:General  Level of Consciousness: Alert, Awake, Oriented  Airway & Oxygen Therapy: Patient Spontanous Breathing  Post-op Assessment: Report given to RN  Post vital signs: Reviewed and stable  Last Vitals:  Vitals:   03/20/21 1157  BP: (!) 138/91  Pulse: 71  Resp: 16  Temp: 36.9 C  SpO2: [id number]    Complications: No apparent anesthesia complications

## 2021-03-20 NOTE — Progress Notes (Addendum)
Called pt's niece, Enid Derry, to inform her of delay in surgery time. She has question as to when surgery will start. Rn will call her when he goes to surgery.  1614:  Called pt's niece, Enid Derry, that pt is going to surgery now.

## 2021-03-20 NOTE — Progress Notes (Signed)
Pt tried use bathroom, notified Dr. Molli Posey regarding pt void 5:50 pm prior to go to OR, Bladder scan done @ 9:10 pm 119m,pt said not felt to need go to BR.  Pt can go home, made sure sitz bath and pain medicine. Notified family and gave instruction papers, updated pain medicine pt. Took it.

## 2021-03-20 NOTE — Progress Notes (Signed)
Pt was in BR, unable to void,and some bleeding with blood clotts. Notofied Dr. Zenia Resides said give one more hours to try urinate, if he cannot void, need f-cath to go home and check office on Monday. Also expecting some bleeding.  Pt was nauseated, gave some zofran, notified family updated Rx to [facility] @ [address]. And pt's status.

## 2021-03-21 ENCOUNTER — Encounter (HOSPITAL_COMMUNITY): Payer: Self-pay | Admitting: Surgery

## 2021-03-24 LAB — SURGICAL PATHOLOGY

## 2021-03-31 ENCOUNTER — Ambulatory Visit (HOSPITAL_COMMUNITY): Payer: Medicare Other

## 2021-04-08 ENCOUNTER — Telehealth: Payer: Self-pay | Admitting: Pharmacist

## 2021-04-08 NOTE — Progress Notes (Signed)
    Chronic Care Management Pharmacy Assistant   Name: Manolito Meersman  MRN: [id number] DOB: 07/08/1951  Reason for Encounter: Patient Assistance Follow up ([place])  Per Thurmond Butts, followed up on application for patient assistance for [employer] with [employer].  Call to the company at [phone] spoke to [place] whom advised she did not see an application for the patient on file. Per notes in chart application was mailed in April and patient was left a message to be made aware to fill out his portion and take the rest to the prescribing provider for completion and for them to fax to manufacturer.  Call to the patient to see if he ever received the paperwork, did not reach him. Left voicemail that I was checking the status and wanting to find out  if he is still having trouble affording the medication and still in need of patient assistance. Asked he call me back to discuss.     Medications: Outpatient Encounter Medications as of 04/08/2021  Medication Sig   acetaminophen (TYLENOL) 325 MG tablet Take 325 mg by mouth every 6 (six) hours as needed for mild pain or moderate pain.   Adalimumab (HUMIRA PEN) 40 MG/0.4ML PNKT Inject 40 mg into the skin every 21 ( twenty-one) days.   atorvastatin (LIPITOR) 20 MG tablet Take 1 tablet (20 mg total) by mouth daily.   brimonidine-timolol (COMBIGAN) 0.2-0.5 % ophthalmic solution Place 1 drop into both eyes 2 (two) times daily.   Cholecalciferol 25 MCG (1000 UT) capsule Take 1,000 Units by mouth daily.    indapamide (LOZOL) 1.25 MG tablet Take 1.25 mg by mouth daily. Pt takes in the pm   latanoprost (XALATAN) 0.005 % ophthalmic solution Place 1 drop into both eyes at bedtime.   LOTEMAX 0.5 % GEL Place 1 drop into the left eye daily.   oxyCODONE (OXY IR/ROXICODONE) 5 MG immediate release tablet Take 1-2 tablets (5-10 mg total) by mouth every 6 (six) hours as needed for moderate pain, severe pain or breakthrough pain.    polyethylene glycol (MIRALAX / GLYCOLAX) 17 g packet Take 17 g by mouth daily as needed (Constipation).   potassium citrate (UROCIT-K) 10 MEQ (1080 MG) SR tablet Take 10 mEq by mouth 3 (three) times daily.   No facility-administered encounter medications on file as of 04/08/2021.    Care Gaps: CCM F/U Call - Scheduled 05/04/21 10am AWV- Needs appointment MSG sent to Ramond Craver CMA to schedule. COVID Booster #5 Therapist, music) - [employer] Rating Drugs: Atorvastatin (Lipitor) 20 mg- Last filled 03-09-21 90 DS at [place] Pharmacist Assistant [phone]

## 2021-04-20 ENCOUNTER — Other Ambulatory Visit: Payer: Self-pay

## 2021-04-20 ENCOUNTER — Ambulatory Visit (HOSPITAL_COMMUNITY): Payer: Medicare Other | Attending: Cardiology

## 2021-04-20 DIAGNOSIS — I351 Nonrheumatic aortic (valve) insufficiency: Secondary | ICD-10-CM | POA: Insufficient documentation

## 2021-04-20 DIAGNOSIS — I77819 Aortic ectasia, unspecified site: Secondary | ICD-10-CM | POA: Diagnosis not present

## 2021-04-20 LAB — ECHOCARDIOGRAM LIMITED
Area-P 1/2: 3.12 cm2
P 1/2 time: 847 msec

## 2021-04-23 ENCOUNTER — Other Ambulatory Visit: Payer: Self-pay | Admitting: *Deleted

## 2021-04-23 ENCOUNTER — Other Ambulatory Visit: Payer: Self-pay

## 2021-04-23 ENCOUNTER — Ambulatory Visit: Payer: Medicare Other | Admitting: Sports Medicine

## 2021-04-23 ENCOUNTER — Encounter: Payer: Self-pay | Admitting: Sports Medicine

## 2021-04-23 ENCOUNTER — Ambulatory Visit (INDEPENDENT_AMBULATORY_CARE_PROVIDER_SITE_OTHER): Payer: Medicare Other

## 2021-04-23 DIAGNOSIS — B351 Tinea unguium: Secondary | ICD-10-CM | POA: Diagnosis not present

## 2021-04-23 DIAGNOSIS — M79675 Pain in left toe(s): Secondary | ICD-10-CM

## 2021-04-23 DIAGNOSIS — Z23 Encounter for immunization: Secondary | ICD-10-CM

## 2021-04-23 DIAGNOSIS — M79674 Pain in right toe(s): Secondary | ICD-10-CM | POA: Diagnosis not present

## 2021-04-23 DIAGNOSIS — Z79899 Other long term (current) drug therapy: Secondary | ICD-10-CM | POA: Diagnosis not present

## 2021-04-23 NOTE — Progress Notes (Signed)
Subjective: Antonio Ayers is a 70 y.o. male patient seen today in office with complaint of mildly painful thickened and elongated toenails; unable to trim. Has a history of Anklyosis spondylitis on Humera unchanged from prior.  Reports that he had his colon procedure done and is still recovering.  But otherwise doing okay.  Patient Active Problem List   Diagnosis Date Noted   Agatston coronary artery calcium score greater than 400    Aortic aneurysm (HCC)    Aortic valve regurgitation 01/09/2019   Nephrolithiasis 01/09/2019   BPH (benign prostatic hyperplasia) 01/09/2019   Overweight (BMI 25.0-29.9) 01/09/2019   Primary osteoarthritis of both hips 12/29/2018   Primary osteoarthritis of both knees 12/29/2018   Raised intraocular pressure of both eyes 12/19/2018   Ankylosing spondylitis of multiple sites in spine ([place]) 12/19/2018   Hiatal hernia with GERD 11/08/2018   History of colonoscopy 01/07/2018   Gall stone in bile duct with infection of gallbladder 12/14/2017   Hernia of abdominal cavity 12/07/2016   Dislocated elbow, left, initial encounter 10/07/2016   Enlarged liver 12/08/2006   Aortic anomaly 01/07/2005   Fracture 02/17/2004   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     Adalimumab (HUMIRA PEN) 40 MG/0.4ML PNKT Inject 40 mg into the skin every 21 ( twenty-one) days. 4 each 0   atorvastatin (LIPITOR) 20 MG tablet Take 1 tablet (20 mg total) by mouth daily. 90 tablet 3   brimonidine-timolol (COMBIGAN) 0.2-0.5 % ophthalmic solution Place 1 drop into both eyes 2 (two) times daily.     Cholecalciferol 25 MCG (1000 UT) capsule Take 1,000 Units by mouth daily.      indapamide (LOZOL) 1.25 MG tablet Take 1.25 mg by mouth daily. Pt takes in the pm     latanoprost (XALATAN) 0.005 %  ophthalmic solution Place 1 drop into both eyes at bedtime.     LOTEMAX 0.5 % GEL Place 1 drop into the left eye daily.     oxyCODONE (OXY IR/ROXICODONE) 5 MG immediate release tablet Take 1-2 tablets (5-10 mg total) by mouth every 6 (six) hours as needed for moderate pain, severe pain or breakthrough pain. 30 tablet 0   polyethylene glycol (MIRALAX / GLYCOLAX) 17 g packet Take 17 g by mouth daily as needed (Constipation).     potassium citrate (UROCIT-K) 10 MEQ (1080 MG) SR tablet Take 10 mEq by mouth 3 (three) times daily.     No current facility-administered medications on file prior to visit.    No Known Allergies  Objective: Physical Exam  General: Well developed, nourished, no acute distress, awake, alert and oriented x 3  Vascular: Dorsalis pedis artery 2/4 bilateral, Posterior tibial artery 1/4 bilateral, skin temperature warm to warm proximal to distal bilateral lower extremities, no varicosities, pedal hair present bilateral.  Neurological: Gross sensation present via light touch bilateral.   Dermatological: Skin is warm, dry, and supple bilateral, Nails 1-10 are tender, long, thick, and discolored with mild subungal debris, no webspace macerations present bilateral, no open lesions present bilateral, no callus/corns/hyperkeratotic tissue present bilateral. No signs of infection bilateral.  Musculoskeletal:  Asymptomatic hammertoe boney deformities noted bilateral. Muscular strength within normal limits without painon range of motion. No pain with calf compression bilateral.  Assessment and Plan:  Problem List Items Addressed  This Visit   None Visit Diagnoses     Pain due to onychomycosis of toenails of both feet    -  Primary      -Examined patient.  -Re-Discussed treatment options for painful mycotic nails. -Mechanically debrided and reduced mycotic nails with sterile nail nipper and dremel nail file without incident. -Encouraged elevation for edema control and  continued use of compression garments like before -Patient to return in 3 months for follow up evaluation or sooner if symptoms worsen.  Landis Martins, DPM

## 2021-04-24 LAB — COMPLETE METABOLIC PANEL WITH GFR
AG Ratio: 1.9 (calc) (ref 1.0–2.5)
ALT: 23 U/L (ref 9–46)
AST: 19 U/L (ref 10–35)
Albumin: 4.1 g/dL (ref 3.6–5.1)
Alkaline phosphatase (APISO): 80 U/L (ref 35–144)
BUN/Creatinine Ratio: 17 (calc) (ref 6–22)
BUN: 11 mg/dL (ref 7–25)
CO2: 28 mmol/L (ref 20–32)
Calcium: 9 mg/dL (ref 8.6–10.3)
Chloride: 99 mmol/L (ref 98–110)
Creat: 0.64 mg/dL — ABNORMAL LOW (ref 0.70–1.28)
Globulin: 2.2 g/dL (calc) (ref 1.9–3.7)
Glucose, Bld: 91 mg/dL (ref 65–99)
Potassium: 4.3 mmol/L (ref 3.5–5.3)
Sodium: 136 mmol/L (ref 135–146)
Total Bilirubin: 2.5 mg/dL — ABNORMAL HIGH (ref 0.2–1.2)
Total Protein: 6.3 g/dL (ref 6.1–8.1)
eGFR: 101 mL/min/{1.73_m2} (ref >=60)

## 2021-04-24 LAB — CBC WITH DIFFERENTIAL/PLATELET
Absolute Monocytes: 944 cells/uL (ref 200–950)
Basophils Absolute: 39 cells/uL (ref 0–200)
Basophils Relative: 0.5 %
Eosinophils Absolute: 156 cells/uL (ref 15–500)
Eosinophils Relative: 2 %
HCT: 41.8 % (ref 38.5–50.0)
Hemoglobin: 14.5 g/dL (ref 13.2–17.1)
Lymphs Abs: 1193 cells/uL (ref 850–3900)
MCH: 32.9 pg (ref 27.0–33.0)
MCHC: 34.7 g/dL (ref 32.0–36.0)
MCV: 94.8 fL (ref 80.0–100.0)
MPV: 9.4 fL (ref 7.5–12.5)
Monocytes Relative: 12.1 %
Neutro Abs: 5468 cells/uL (ref 1500–7800)
Neutrophils Relative %: 70.1 %
Platelets: 178 10*3/uL (ref 140–400)
RBC: 4.41 10*6/uL (ref 4.20–5.80)
RDW: 12.5 % (ref 11.0–15.0)
Total Lymphocyte: 15.3 %
WBC: 7.8 10*3/uL (ref 3.8–10.8)

## 2021-04-24 NOTE — Progress Notes (Signed)
CBC WNL.  Creatinine is borderline low.  GFR is WNL.  Total bilirubin remains elevated.  Please notify the patient and forward lab work to PCP.

## 2021-04-27 ENCOUNTER — Other Ambulatory Visit: Payer: Self-pay

## 2021-04-27 DIAGNOSIS — M450 Ankylosing spondylitis of multiple sites in spine: Secondary | ICD-10-CM

## 2021-04-27 MED ORDER — HUMIRA (2 PEN) 40 MG/0.4ML ~~LOC~~ AJKT: 40.0000 mg | AUTO-INJECTOR | SUBCUTANEOUS | 0 refills | Status: DC

## 2021-04-27 NOTE — Telephone Encounter (Signed)
Next Visit: 05/21/2021  Last Visit: 12/18/2020  Last Fill: 02/19/2021  DX:Ankylosing spondylitis of multiple sites in spine  Current Dose per office note on 12/18/2020: Humira 40 mg sq injections every 21 days.  Labs: 04/23/2021 CBC WNL.  Creatinine is borderline low.  GFR is WNL.  Total bilirubin remains elevated.  TB Gold: 12/18/2020 negative    Okay to refill humira?

## 2021-04-27 NOTE — Telephone Encounter (Signed)
Patient called requesting prescription of Humira.  Patient states [employer] needs a new prescription.

## 2021-04-29 ENCOUNTER — Telehealth: Payer: Self-pay | Admitting: Pharmacist

## 2021-04-29 NOTE — Chronic Care Management (AMB) (Signed)
    Chronic Care Management Pharmacy Assistant   Name: Antonio Ayers  MRN: [id number] DOB: 17-Apr-1951  Reason for Encounter: Patient called  04-29-2021 Per Thurmond Butts patient left message and needing a call back. Call to patient to inquire how I may be of assistance, no answer left message for return call. Return call from patient he wanted to confirm his upcoming appointment date with Watt Climes advised him of date and time he was in agreement.   Medications: Outpatient Encounter Medications as of 04/29/2021  Medication Sig   acetaminophen (TYLENOL) 325 MG tablet Take 325 mg by mouth every 6 (six) hours as needed for mild pain or moderate pain.   Adalimumab (HUMIRA PEN) 40 MG/0.4ML PNKT Inject 40 mg into the skin every 21 ( twenty-one) days.   atorvastatin (LIPITOR) 20 MG tablet Take 1 tablet (20 mg total) by mouth daily.   brimonidine-timolol (COMBIGAN) 0.2-0.5 % ophthalmic solution Place 1 drop into both eyes 2 (two) times daily.   Cholecalciferol 25 MCG (1000 UT) capsule Take 1,000 Units by mouth daily.    indapamide (LOZOL) 1.25 MG tablet Take 1.25 mg by mouth daily. Pt takes in the pm   latanoprost (XALATAN) 0.005 % ophthalmic solution Place 1 drop into both eyes at bedtime.   LOTEMAX 0.5 % GEL Place 1 drop into the left eye daily.   oxyCODONE (OXY IR/ROXICODONE) 5 MG immediate release tablet Take 1-2 tablets (5-10 mg total) by mouth every 6 (six) hours as needed for moderate pain, severe pain or breakthrough pain.   polyethylene glycol (MIRALAX / GLYCOLAX) 17 g packet Take 17 g by mouth daily as needed (Constipation).   potassium citrate (UROCIT-K) 10 MEQ (1080 MG) SR tablet Take 10 mEq by mouth 3 (three) times daily.   No facility-administered encounter medications on file as of 04/29/2021.   Care Gaps: CCM F/U Call - Scheduled 05/04/21 10am AWV- Needs appointment MSG sent to Ramond Craver CMA to schedule. COVID Booster #5 Therapist, music) - [employer] Rating  Drugs: Atorvastatin (Lipitor) 20 mg- Last filled 03-09-21 90 DS at [place] Pharmacist Assistant [phone]

## 2021-05-01 ENCOUNTER — Telehealth: Payer: Self-pay | Admitting: Pharmacist

## 2021-05-01 NOTE — Chronic Care Management (AMB) (Signed)
    Chronic Care Management Pharmacy Assistant   Name: Antonio Ayers  MRN: [id number] DOB: [birth year]  9-23-222 [place], No answer, left message of appointment on 05-04-2021 at 10 via telephone visit with [name], Pharm D. Notified to have all medications, supplements, blood pressure and/or blood sugar logs available during appointment and to return call if need to reschedule.   Care Gaps: CCM F/U Call - Scheduled 05/04/21 10am AWV- Needs appointment MSG sent to Ramond Craver CMA to schedule. COVID Booster #5 Therapist, music) - Overdue     Star Rating Drugs: Atorvastatin (Lipitor) 20 mg- Last filled 03-09-21 90 DS at [facility]   Any gaps in medications fill history?  None     Medications: Outpatient Encounter Medications as of 05/01/2021  Medication Sig   acetaminophen (TYLENOL) 325 MG tablet Take 325 mg by mouth every 6 (six) hours as needed for mild pain or moderate pain.   Adalimumab (HUMIRA PEN) 40 MG/0.4ML PNKT Inject 40 mg into the skin every 21 ( twenty-one) days.   atorvastatin (LIPITOR) 20 MG tablet Take 1 tablet (20 mg total) by mouth daily.   brimonidine-timolol (COMBIGAN) 0.2-0.5 % ophthalmic solution Place 1 drop into both eyes 2 (two) times daily.   Cholecalciferol 25 MCG (1000 UT) capsule Take 1,000 Units by mouth daily.    indapamide (LOZOL) 1.25 MG tablet Take 1.25 mg by mouth daily. Pt takes in the pm   latanoprost (XALATAN) 0.005 % ophthalmic solution Place 1 drop into both eyes at bedtime.   LOTEMAX 0.5 % GEL Place 1 drop into the left eye daily.   oxyCODONE (OXY IR/ROXICODONE) 5 MG immediate release tablet Take 1-2 tablets (5-10 mg total) by mouth every 6 (six) hours as needed for moderate pain, severe pain or breakthrough pain.   polyethylene glycol (MIRALAX / GLYCOLAX) 17 g packet Take 17 g by mouth daily as needed (Constipation).   potassium citrate (UROCIT-K) 10 MEQ (1080 MG) SR tablet Take 10 mEq by mouth 3 (three)  times daily.   No facility-administered encounter medications on file as of 05/01/2021.   [place] Clinical Pharmacist Assistant [phone]

## 2021-05-04 ENCOUNTER — Ambulatory Visit (INDEPENDENT_AMBULATORY_CARE_PROVIDER_SITE_OTHER): Payer: Medicare Other | Admitting: Pharmacist

## 2021-05-04 DIAGNOSIS — N400 Enlarged prostate without lower urinary tract symptoms: Secondary | ICD-10-CM

## 2021-05-04 DIAGNOSIS — N200 Calculus of kidney: Secondary | ICD-10-CM

## 2021-05-04 DIAGNOSIS — H40053 Ocular hypertension, bilateral: Secondary | ICD-10-CM

## 2021-05-04 DIAGNOSIS — R82994 Hypercalciuria: Secondary | ICD-10-CM | POA: Diagnosis not present

## 2021-05-04 NOTE — Progress Notes (Signed)
Chronic Care Management Pharmacy Note  05/04/2021 Name:  Antonio Ayers MRN:  [id number] DOB:  Nov 21, 1950  Summary: LDL at goal < 70 Pt reports more dizziness than usual   Recommendations/Changes made from today's visit: -Recommended monitoring and keeping a log of BP readings when having dizziness -Added metamucil to medication list as patient is taking regularly   Plan: Apply for PAP for [place] Follow up with pharmacist in 1 year  Subjective: Antonio Ayers is an 70 y.o. year old male who is a primary patient of [name], [name], MD.  The CCM team was consulted for assistance with disease management and care coordination needs.    Engaged with patient by telephone for follow up visit in response to provider referral for pharmacy case management and/or care coordination services.   Consent to Services:  The patient was given information about Chronic Care Management services, agreed to services, and gave verbal consent prior to initiation of services.  Please see initial visit note for detailed documentation.   Patient Care Team: [name], [name], MD as PCP - General (Internal Medicine) Sueanne Margarita, MD as PCP - Cardiology (Cardiology) Viona Gilmore, [facility] as Pharmacist (Pharmacist) Michael Boston, MD as Consulting Physician (General Surgery) Danis, Kirke Corin, MD as Consulting Physician (Gastroenterology) Sueanne Margarita, MD as Consulting Physician (Cardiology) Bo Merino, MD as Consulting Physician (Rheumatology) Irine Seal, MD as Attending Physician (Urology)  Recent office visits: 01-20-2021 [name], [name], MD (PCP) - Tele visit for colon cancer screening and other concerns. No medication changes.  Recent consult visits: 04/23/21 Landis Martins, DPM (podiatry): Patient presented for nail trim.  03/11/21 Michael Boston, MD: Patient presented for surgery prep.  01-26-2021 Orson Slick, MD (Oncology) - Patient presented  for monoclonal gammopathy of unknown significance and other concerns. No medication changes.   01-21-2021 Guerry Minors, MD (Surg) - Patient presented for second opinion for rectal polyp. No medication changes.    01-15-2021 Landis Martins, DPM (Podiatry) - Patient presented for Pain due to onychomycosis of toenails of both feet. No medication changes.   12-22-2020 Sueanne Margarita, MD ( Cardiology) - Patient presented for Nonrheumatic aortic valve insufficiency and other concerns. No medication changes.    12-18-2020 Ofilia Neas, PA-C (Rheumatology) - Patient presented for Ankylosing spondylitis of multiple sites in spine and other concerns. Discontinued COLACE 100 MG capsule  10/16/20 Wilfrid Lund, MD Gertie Fey): Patient presented for rectal bleeding and chronic constipation. Recommended a small amount of Miralax daily and colonoscopy planned.  10/09/20 Landis Martins, DPM (podiatry): Patient presented for nail trim.  09/09/20 [name], MD (cardiology): Patient presented for video visit for follow up. Started atorvastatin 20 mg daily.  09/05/20 Bo Merino, MD (rheumatology): Patient presented for video visit for ankylosing spondylitis follow up. No changes made.  05/07/20 Wilfrid Lund, MD (gastro): Patient presented for GERD follow up and scheduling colonoscopy.  11/13/19 Cleotilde Neer: Patient presented for hemangioma of skin follow up. Unable to access notes.   11/05/19 Jola Schmidt (ophthalmology): Patient presented for open-angle glaucoma follow up. Unable to access notes.  Hospital visits: 03/20/21 Patient admitted for removal of anal polyps.  Objective:  Lab Results  Component Value Date   CREATININE 0.64 (L) 04/23/2021   BUN 11 04/23/2021   GFR 113.56 06/22/2019   GFRNONAA >60 03/09/2021   GFRAA 108 12/18/2020   NA 136 04/23/2021   K 4.3 04/23/2021   CALCIUM 9.0 04/23/2021   CO2 28 04/23/2021  GLUCOSE 91 04/23/2021    Lab Results  Component Value Date/Time    HGBA1C 5.0 06/22/2019 11:05 AM   GFR 113.56 06/22/2019 11:05 AM    Last diabetic Eye exam: No results found for: HMDIABEYEEXA  Last diabetic Foot exam: No results found for: HMDIABFOOTEX   Lab Results  Component Value Date   CHOL 107 10/21/2020   HDL 53 10/21/2020   LDLCALC 39 10/21/2020   TRIG 72 10/21/2020   CHOLHDL 2.0 10/21/2020    Hepatic Function Latest Ref Rng & Units 04/23/2021 01/26/2021 12/18/2020  Total Protein 6.1 - 8.1 g/dL 6.3 7.0 6.7  Albumin 3.5 - 5.0 g/dL - 4.2 -  AST 10 - 35 U/L 19 26 -  ALT 9 - 46 U/L 23 31 -  Alk Phosphatase 38 - 126 U/L - 69 -  Total Bilirubin 0.2 - 1.2 mg/dL 2.5(H) 4.2(HH) -    Lab Results  Component Value Date/Time   TSH 0.77 06/22/2019 11:05 AM   TSH 1.59 12/19/2018 12:55 PM    CBC Latest Ref Rng & Units 04/23/2021 03/09/2021 01/26/2021  WBC 3.8 - 10.8 Thousand/uL 7.8 8.6 7.1  Hemoglobin 13.2 - 17.1 g/dL 14.5 15.1 15.8  Hematocrit 38.5 - 50.0 % 41.8 41.6 42.5  Platelets 140 - 400 Thousand/uL 178 171 175    Lab Results  Component Value Date/Time   VD25OH 41.37 06/22/2019 11:05 AM    Clinical ASCVD: Yes  The ASCVD Risk score (Arnett DK, et al., 2019) failed to calculate for the following reasons:   The valid total cholesterol range is 130 to 320 mg/dL    Depression screen [facility] 2/9 06/24/2020 06/22/2019 01/09/2019  Decreased Interest 0 0 0  Down, Depressed, Hopeless 0 0 0  PHQ - 2 Score 0 0 0  Altered sleeping _0 Tired, decreased energy _1 Change in appetite 0 0 0  Feeling bad or failure about yourself  0 0 0  Trouble concentrating 0 0 0  Moving slowly or fidgety/restless 0 0 0  Suicidal thoughts 0 0 0  PHQ-9 Score _2 Difficult doing work/chores Not difficult at all Not difficult at all Not difficult at all      Social History   Tobacco Use  Smoking Status Never  Smokeless Tobacco Never   BP Readings from Last 3 Encounters:  03/20/21 (!) 145/97  03/09/21 110/80  01/26/21 92/70   Pulse Readings from Last 3  Encounters:  03/20/21 75  03/09/21 81  01/26/21 (!) 59   Wt Readings from Last 3 Encounters:  03/20/21 233 lb 0.4 oz (105.7 kg)  03/09/21 233 lb (105.7 kg)  01/26/21 224 lb 9.6 oz (101.9 kg)   BMI Readings from Last 3 Encounters:  03/20/21 30.74 kg/m  03/09/21 30.74 kg/m  01/26/21 29.63 kg/m    Assessment/Interventions: Review of patient past medical history, allergies, medications, health status, including review of consultants reports, laboratory and other test data, was performed as part of comprehensive evaluation and provision of chronic care management services.   SDOH:  (Social Determinants of Health) assessments and interventions performed: No  SDOH Screenings   Alcohol Screen: Not on file  Depression (PHQ2-9): Low Risk    PHQ-2 Score: 3  Financial Resource Strain: Not on file  Food Insecurity: Not on file  Housing: Not on file  Physical Activity: Not on file  Social Connections: Not on file  Stress: Not on file  Tobacco Use: Low Risk    Smoking  Tobacco Use: Never   Smokeless Tobacco Use: Never  Transportation Needs: Not on file    [place]  No Known Allergies  Medications Reviewed Today     Reviewed by Landis Martins, DPM (Physician) on 04/23/21 at 1307  Med List Status: <None>   Medication Order Taking? Sig Documenting Provider Last Dose Status Informant  acetaminophen (TYLENOL) 325 MG tablet [id number] No Take 325 mg by mouth every 6 (six) hours as needed for mild pain or moderate pain. [provider] Past Week Active Self  Adalimumab (HUMIRA PEN) 40 MG/0.4ML PNKT [id number] No Inject 40 mg into the skin every 21 ( twenty-one) days. Ofilia Neas, PA-C 03/09/2021 Active Self  atorvastatin (LIPITOR) 20 MG tablet [id number] No Take 1 tablet (20 mg total) by mouth daily. Sueanne Margarita, MD 03/20/2021 0730 Active Self  brimonidine-timolol (COMBIGAN) 0.2-0.5 % ophthalmic solution [id number] No Place 1 drop into both eyes 2 (two) times daily.  [provider] 03/20/2021 0730 Active Self  Cholecalciferol 25 MCG (1000 UT) capsule [id number] No Take 1,000 Units by mouth daily.  [provider] 03/20/2021 0730 Active Self  indapamide (LOZOL) 1.25 MG tablet [id number] No Take 1.25 mg by mouth daily. Pt takes in the pm [provider] 03/19/2021 1900 Active Self  latanoprost (XALATAN) 0.005 % ophthalmic solution [id number] No Place 1 drop into both eyes at bedtime. [provider] 03/19/2021 1900 Active Self  LOTEMAX 0.5 % GEL [id number] No Place 1 drop into the left eye daily. [provider] 03/20/2021 0730 Active Self  oxyCODONE (OXY IR/ROXICODONE) 5 MG immediate release tablet [id number]  Take 1-2 tablets (5-10 mg total) by mouth every 6 (six) hours as needed for moderate pain, severe pain or breakthrough pain. Dwan Bolt, MD  Active   polyethylene glycol (MIRALAX / GLYCOLAX) 17 g packet [id number] No Take 17 g by mouth daily as needed (Constipation). [provider] 03/19/2021 1030 Active Self  potassium citrate (UROCIT-K) 10 MEQ (1080 MG) SR tablet [id number] No Take 10 mEq by mouth 3 (three) times daily. [provider] 03/20/2021 0730 Active Self            Patient Active Problem List   Diagnosis Date Noted   Agatston coronary artery calcium score greater than 400    Aortic aneurysm (HCC)    Aortic valve regurgitation 01/09/2019   Nephrolithiasis 01/09/2019   BPH (benign prostatic hyperplasia) 01/09/2019   Overweight (BMI 25.0-29.9) 01/09/2019   Primary osteoarthritis of both hips 12/29/2018   Primary osteoarthritis of both knees 12/29/2018   Raised intraocular pressure of both eyes 12/19/2018   Ankylosing spondylitis of multiple sites in spine ([place]) 12/19/2018   Hiatal hernia with GERD 11/08/2018   History of colonoscopy 01/07/2018   Gall stone in bile duct with infection of gallbladder 12/14/2017   Hernia of abdominal cavity 12/07/2016   Dislocated elbow, left,  initial encounter 10/07/2016   Enlarged liver 12/08/2006   Aortic anomaly 01/07/2005   Fracture 02/17/2004   Keratoconus 12/07/1977    Immunization History  Administered Date(s) Administered   Fluad Quad(high Dose 65+) 04/30/2019, 05/02/2020, 04/23/2021   Influenza, High Dose Seasonal PF 08/18/2015, 05/05/2016, 06/16/2017, 05/31/2018   PFIZER(Purple Top)SARS-COV-2 Vaccination 09/01/2019, 09/22/2019, 03/30/2020, 09/16/2020   Pneumococcal Conjugate-13 08/18/2015, 02/22/2019   Pneumococcal Polysaccharide-23 08/26/2016, 06/24/2020   Tdap 07/09/2014, 06/25/2019   Zoster Recombinat (Shingrix) 02/22/2019, 04/30/2019   Zoster, Live 07/09/2014    Conditions to be addressed/monitored:  Hyperlipidemia, GERD and Ankylosing spondylitis, glaucoma, kidney stones, constipation  Conditions addressed this visit: Constipation, kidney stones, hyperlipidemia  Care Plan : CCM Pharmacy Care Plan  Updates made by Viona Gilmore, [place] since 05/04/2021 12:00 AM     Problem: Problem: Hyperlipidemia, GERD and Ankylosing spondylitis, glaucoma, kidney stones, constipation      Long-Range Goal: Patient-Specific Goal   Start Date: 11/03/2020  Expected End Date: 11/03/2021  Recent Progress: On track  Priority: High  Note:   Current Barriers:  Unable to independently monitor therapeutic efficacy  Pharmacist Clinical Goal(s):  Patient will achieve adherence to monitoring guidelines and medication adherence to achieve therapeutic efficacy through collaboration with PharmD and provider.   Interventions: 1:1 collaboration with [name], [name], MD regarding development and update of comprehensive plan of care as evidenced by provider attestation and co-signature Inter-disciplinary care team collaboration (see longitudinal plan of care) Comprehensive medication review performed; medication list updated in electronic medical record  Hyperlipidemia/elevated CAC score: (LDL goal <  70) -Controlled -Current treatment: Atorvastatin 20 mg 1 tablet daily -Medications previously tried: none  -Current dietary patterns: did not discuss -Current exercise habits: limited mobility -Educated on Cholesterol goals;  Importance of limiting foods high in cholesterol; Exercise goal of 150 minutes per week; -Recommended to continue current medication  Ankylosing spondylitis (Goal: minimize symptoms) -Controlled -Current treatment  Humira 40 mg inject into the skin every 21 days -Medications previously tried: none  -Recommended to continue current medication Assessed patient finances. Patient is getting Humira with myabbvie assist.  GERD with hiatal hernia (Goal: minimize symptoms) -Controlled -Current treatment  No medications -Medications previously tried: omeprazole  -Recommended to continue as is  Glaucoma (Goal: lower intraocular pressure) -Controlled -Current treatment  Latanoprost 0.005% 1 drop in left before bedtime Lotemax 0.5% gel - apply to left eye Bimatoprost-timolol 0.2-0.5% 1 drop in both eyes twice daily  -Medications previously tried: none  -Assessed patient finances. Patient will be dropping off completed application tomorrow.  Constipation (Goal: regular bowel movements) -Controlled -Current treatment  Miralax 1 tbsp a day - just taking this for now Metamucil twice daily -Medications previously tried: none -Recommended to continue current medication  Kidney stones (Goal: prevent future kidney stones) -Controlled -Current treatment  Indapamide 1.25 mg 1 tablet daily Potassium 10 mEq 1 tablet three times daily -Medications previously tried: none  -Counseled on drinking more water to help with some periodic dizziness and checking blood pressure with these symptoms   Health Maintenance -Vaccine gaps: none -Current therapy:  Vitamin D 1000 unit 1 capsule daily -Educated on Cost vs benefit of each product must be carefully weighed by  individual consumer -Patient is satisfied with current therapy and denies issues -Recommended to continue current medication  Patient Goals/Self-Care Activities Patient will:  - take medications as prescribed check blood pressure when symptomatic, document, and provide at future appointments target a minimum of 150 minutes of moderate intensity exercise weekly  Follow Up Plan: Telephone follow up appointment with care management team member scheduled for: 1 year       Medication Assistance:  Humiraobtained through [facility] medication assistance program.  Enrollment ends 08/08/21  Compliance/Adherence/Medication fill history: Care Gaps: COVID booster   Star-Rating Drugs: Atorvastatin (Lipitor) 20 mg- Last [facility] 03-09-21 90 [facility]  Patient's preferred pharmacy is:  [id number], [place] [address] Phone: [phone] Fax: [phone]  [facility] - [place], [place] [id number], [place] [address] Phone: [phone] Fax: [phone]  [id number] [place] [id number]: [phone]: [phone] Uses pill box? No - only has 3 medicines Pt endorses 100% compliance  We discussed: Benefits of medication synchronization, packaging and delivery as well as enhanced pharmacist oversight with [employer]. Patient decided to: Continue current medication management strategy  Care Plan and Follow Up Patient Decision:  Patient agrees to Care Plan and Follow-up.  Plan: Telephone follow up appointment with care management team member scheduled for:  1 year  MJeni Salles PharmD BNorth CatasauquaPharmacist LOccidental Petroleumat B[phone]

## 2021-05-04 NOTE — Patient Instructions (Signed)
Hi Terry,  It was great catching up with you! Don't forget to check on your blood pressure when you are having dizziness or lightheadedness and keep a log of the readings.  Please reach out to me if you have any questions or need anything before our follow up!  Best, [name]  [name], PharmD, [place] [facility] [place]   Visit Information   Goals Addressed   None    Patient Care Plan: CCM Pharmacy Care Plan     Problem Identified: Problem: Hyperlipidemia, GERD and Ankylosing spondylitis, glaucoma, kidney stones, constipation      Long-Range Goal: Patient-Specific Goal   Start Date: 11/03/2020  Expected End Date: 11/03/2021  Recent Progress: On track  Priority: High  Note:   Current Barriers:  Unable to independently monitor therapeutic efficacy  Pharmacist Clinical Goal(s):  Patient will achieve adherence to monitoring guidelines and medication adherence to achieve therapeutic efficacy through collaboration with PharmD and provider.   Interventions: 1:1 collaboration with [name], [name], MD regarding development and update of comprehensive plan of care as evidenced by provider attestation and co-signature Inter-disciplinary care team collaboration (see longitudinal plan of care) Comprehensive medication review performed; medication list updated in electronic medical record  Hyperlipidemia/elevated CAC score: (LDL goal < 70) -Controlled -Current treatment: Atorvastatin 20 mg 1 tablet daily -Medications previously tried: none  -Current dietary patterns: did not discuss -Current exercise habits: limited mobility -Educated on Cholesterol goals;  Importance of limiting foods high in cholesterol; Exercise goal of 150 minutes per week; -Recommended to continue current medication  Ankylosing spondylitis (Goal: minimize symptoms) -Controlled -Current treatment  Humira 40 mg inject into the skin every 21  days -Medications previously tried: none  -Recommended to continue current medication Assessed patient finances. Patient is getting Humira with myabbvie assist.  GERD with hiatal hernia (Goal: minimize symptoms) -Controlled -Current treatment  No medications -Medications previously tried: omeprazole  -Recommended to continue as is  Glaucoma (Goal: lower intraocular pressure) -Controlled -Current treatment  Latanoprost 0.005% 1 drop in left before bedtime Lotemax 0.5% gel - apply to left eye Bimatoprost-timolol 0.2-0.5% 1 drop in both eyes twice daily  -Medications previously tried: none  -Assessed patient finances. Patient will be dropping off completed application tomorrow.  Constipation (Goal: regular bowel movements) -Controlled -Current treatment  Miralax 1 tbsp a day - just taking this for now Metamucil twice daily -Medications previously tried: none -Recommended to continue current medication  Kidney stones (Goal: prevent future kidney stones) -Controlled -Current treatment  Indapamide 1.25 mg 1 tablet daily Potassium 10 mEq 1 tablet three times daily -Medications previously tried: none  -Counseled on drinking more water to help with some periodic dizziness and checking blood pressure with these symptoms   Health Maintenance -Vaccine gaps: none -Current therapy:  Vitamin D 1000 unit 1 capsule daily -Educated on Cost vs benefit of each product must be carefully weighed by individual consumer -Patient is satisfied with current therapy and denies issues -Recommended to continue current medication  Patient Goals/Self-Care Activities Patient will:  - take medications as prescribed check blood pressure when symptomatic, document, and provide at future appointments target a minimum of 150 minutes of moderate intensity exercise weekly  Follow Up Plan: Telephone follow up appointment with care management team member scheduled for: 1 year       Patient verbalizes  understanding of instructions provided today and agrees to view in [place].  Telephone follow up appointment with pharmacy team member scheduled for: 1 year  Viona Gilmore, [facility]

## 2021-05-05 ENCOUNTER — Telehealth: Payer: Self-pay | Admitting: Pharmacist

## 2021-05-05 NOTE — Telephone Encounter (Signed)
Received renewal application from [place] for [place] patient assistance. Sent MyChart message to patient to confirm that he has submitted his portion of application. Placed provider portion in Dr. Arlean Hopping folder to be signed  Knox Saliva, PharmD, MPH, BCPS Clinical Pharmacist (Rheumatology and Pulmonology)

## 2021-05-06 NOTE — Telephone Encounter (Signed)
Submitted Patient Assistance Application to [place] for [place] along with provider portion, med list, and insurance card copy. Will update patient when we receive a response.  Fax# [phone] Phone# [phone]

## 2021-05-08 DIAGNOSIS — N400 Enlarged prostate without lower urinary tract symptoms: Secondary | ICD-10-CM | POA: Diagnosis not present

## 2021-05-08 DIAGNOSIS — H40053 Ocular hypertension, bilateral: Secondary | ICD-10-CM

## 2021-05-08 NOTE — Progress Notes (Signed)
Office Visit Note  Patient: [place] A[place]             Date of Birth: 25-Oct-1950           MRN: [id number]             PCP: [name], [name], MD Referring: [name], [facility]* Visit Date: 05/21/2021 Occupation: _0 @  Subjective:  Stiffness   History of Present Illness: Antonio Ayers is a 70 y.o. male with history of ankylosing spondylitis and osteoarthritis.  Patient is currently on Humira 40 mg subcutaneous injections every 21 days.  He has not noticed any increased joint pain, inflammation, or stiffness leading up to his Humira injections.  He does not feel as though the efficacy of Humira is any different when injecting every 14 days versus every 21 days.  He states that overall his symptoms have been unchanged.  He has occasional nocturnal pain in his spine and SI joints but it has been tolerable overall.  He denies any Achilles tendinitis or plantar fasciitis.  He has chronic pain and stiffness in both hip joints and both knees but states that these symptoms are unchanged.  He experiences occasional eye dryness but has not noticed any eye pain or increased photophobia.  He uses Systane eyedrops for symptomatic relief.  He denies any other new concerns at this time. He denies any recent infections.  He states that he had an anal polyp removed on 03/20/2021 at which time he held Humira for about 1 to 2 weeks.  He denies any complications after the surgery.    Activities of Daily Living:  Patient reports morning stiffness for 10 minutes.   Patient Reports nocturnal pain.  Difficulty dressing/grooming: Reports Difficulty climbing stairs: Reports Difficulty getting out of chair: Reports Difficulty using hands for taps, buttons, cutlery, and/or writing: Denies  Review of Systems  Constitutional:  Positive for fatigue.  HENT:  Positive for mouth dryness and nose dryness. Negative for mouth sores.   Eyes:  Positive for dryness. Negative for pain and itching.   Respiratory:  Positive for shortness of breath. Negative for difficulty breathing.   Cardiovascular:  Negative for chest pain and palpitations.  Gastrointestinal:  Negative for blood in stool, constipation and diarrhea.  Endocrine: Negative for increased urination.  Genitourinary:  Negative for difficulty urinating.  Musculoskeletal:  Positive for joint pain, joint pain, joint swelling and morning stiffness. Negative for myalgias, muscle tenderness and myalgias.  Skin:  Positive for rash. Negative for color change.  Allergic/Immunologic: Positive for susceptible to infections.  Neurological:  Positive for dizziness, numbness and headaches. Negative for memory loss and weakness.  Hematological:  Negative for bruising/bleeding tendency.  Psychiatric/Behavioral:  Negative for confusion.    PMFS History:  Patient Active Problem List   Diagnosis Date Noted   Agatston coronary artery calcium score greater than 400    Aortic aneurysm (HCC)    Aortic valve regurgitation 01/09/2019   Nephrolithiasis 01/09/2019   BPH (benign prostatic hyperplasia) 01/09/2019   Overweight (BMI 25.0-29.9) 01/09/2019   Primary osteoarthritis of both hips 12/29/2018   Primary osteoarthritis of both knees 12/29/2018   Raised intraocular pressure of both eyes 12/19/2018   Ankylosing spondylitis of multiple sites in spine ([place]) 12/19/2018   Hiatal hernia with GERD 11/08/2018   History of colonoscopy 01/07/2018   Gall stone in bile duct with infection of gallbladder 12/14/2017   Hernia of abdominal cavity 12/07/2016   Dislocated elbow, left, initial encounter 10/07/2016   Enlarged liver 12/08/2006  Aortic anomaly 01/07/2005   Fracture 02/17/2004   Keratoconus 12/07/1977    Past Medical History:  Diagnosis Date   Abdominal hernia    Agatston coronary artery calcium score greater than 400    coronary Ca 454 on CT 2021   Ankylosing spondylitis (HCC)    Aortic aneurysm (HCC)    mildly dilated ascending aorta  at 2m and aortic root 327mby echo 03/2020   Aortic insufficiency    Chronic diarrhea    Colon polyp    Complication of anesthesia    GERD (gastroesophageal reflux disease)    Headache    Heart murmur    History of hiatal hernia    History of kidney stones    PONV (postoperative nausea and vomiting)     Family History  Problem Relation Age of Onset   Stroke Mother    Stroke Father    Heart failure Father    Diabetes Sister    Obesity Sister    Hiatal hernia Sister    Colon cancer Neg Hx    Esophageal cancer Neg Hx    Rectal cancer Neg Hx    Stomach cancer Neg Hx    Past Surgical History:  Procedure Laterality Date   CATARACT EXTRACTION Left 2016   CHOLECYSTECTOMY  01/2018   COLONOSCOPY     02/2015   CORNEAL TRANSPLANT  1989, 1992, 2014   HEMORRHOID SURGERY N/A 03/20/2021   Procedure: HEMORRHOIDECTOMY WITH LIGATION AND HEMORRHOIDOPEXY;  Surgeon: GrMichael BostonMD;  Location: [facility];  Service: General;  Laterality: N/A;   HERNIA REPAIR  2018   HIATAL HERNIA REPAIR     KIDNEY STONE SURGERY  03/2018   MASS EXCISION N/A 03/20/2021   Procedure: REMOVAL OF ANAL polyp x2;  Surgeon: GrMichael BostonMD;  Location: [facility];  Service: General;  Laterality: N/A;   PROSTATE BIOPSY     x3   RECTAL EXAM UNDER ANESTHESIA N/A 03/20/2021   Procedure: ANORECTAL EXAM UNDER ANESTHESIA;  Surgeon: GrMichael BostonMD;  Location: [facility];  Service: General;  Laterality: N/A;   UPPER GASTROINTESTINAL ENDOSCOPY     Social History   Social History Narrative   Not on file   Immunization History  Administered Date(s) Administered   Fluad Quad(high Dose 65+) 04/30/2019, 05/02/2020, 04/23/2021   Influenza, High Dose Seasonal PF 08/18/2015, 05/05/2016, 06/16/2017, 05/31/2018   PFIZER(Purple Top)SARS-COV-2 Vaccination 09/01/2019, 09/22/2019, 03/30/2020, 09/16/2020, 04/17/2021   Pneumococcal Conjugate-13 08/18/2015, 02/22/2019   Pneumococcal Polysaccharide-23 08/26/2016, 06/24/2020   Tdap 07/09/2014,  06/25/2019   Zoster Recombinat (Shingrix) 02/22/2019, 04/30/2019   Zoster, Live 07/09/2014     Objective: Vital Signs: BP 107/71 (BP Location: Left Arm, Patient Position: Sitting, Cuff Size: Normal)   Pulse 92   Ht _0  (1.854 m)   Wt 217 lb 12.8 oz (98.8 kg)   BMI 28.74 kg/m    Physical Exam Vitals and nursing note reviewed.  Constitutional:      Appearance: He is well-developed.  HENT:     Head: Normocephalic and atraumatic.  Eyes:     Conjunctiva/sclera: Conjunctivae normal.     Pupils: Pupils are equal, round, and reactive to light.  Pulmonary:     Effort: Pulmonary effort is normal.  Abdominal:     Palpations: Abdomen is soft.  Musculoskeletal:     Cervical back: Normal range of motion and neck supple.  Skin:    General: Skin is warm and dry.     Capillary Refill: Capillary refill takes less than 2  seconds.  Neurological:     Mental Status: He is alert and oriented to person, place, and time.  Psychiatric:        Behavior: Behavior normal.     Musculoskeletal Exam: C-spine has limited extension and lateral rotation.  Good flexion of the C-spine noted.  Limited range of motion of the thoracic and lumbar spine.  Thoracic kyphosis noted.  No midline spinal tenderness.  Tenderness over the left SI joint upon palpation.  Shoulder joint abduction to about 120 degrees bilaterally.  Elbow joints have good range of motion with no tenderness or inflammation.  Wrist joints, MCPs, PIPs, DIPs have good range of motion with no synovitis.  DIP prominence consistent with osteoarthritis of both hands noted.  Complete fist formation bilaterally.  Extremely limited range of motion of both hip joints.  Limited extension of both knees.  No warmth or effusion of knee joints noted.  Ankle joints have good range of motion with no tenderness or joint swelling.  No evidence of Achilles tendinitis.  CDAI Exam: CDAI Score: -- Patient Global: --; Provider Global: -- Swollen: --; Tender: -- Joint  Exam 05/21/2021   No joint exam has been documented for this visit   There is currently no information documented on the homunculus. Go to the Rheumatology activity and complete the homunculus joint exam.  Investigation: No additional findings.  Imaging: No results found.  Recent Labs: Lab Results  Component Value Date   WBC 7.8 04/23/2021   HGB 14.5 04/23/2021   PLT 178 04/23/2021   NA 136 04/23/2021   K 4.3 04/23/2021   CL 99 04/23/2021   CO2 28 04/23/2021   GLUCOSE 91 04/23/2021   BUN 11 04/23/2021   CREATININE 0.64 (L) 04/23/2021   BILITOT 2.5 (H) 04/23/2021   ALKPHOS 69 01/26/2021   AST 19 04/23/2021   ALT 23 04/23/2021   PROT 6.3 04/23/2021   ALBUMIN 4.2 01/26/2021   CALCIUM 9.0 04/23/2021   GFRAA 108 12/18/2020   QFTBGOLDPLUS NEGATIVE 12/18/2020    Speciality Comments: No specialty comments available.  Procedures:  No procedures performed Allergies: Patient has no known allergies.   Assessment / Plan:     Visit Diagnoses: Ankylosing spondylitis of multiple sites in spine [facility]) -He has no synovitis or dactylitis on examination today.  He has severely limited range of motion of the C-spine, thoracic spine, lumbar spine.  Thoracic kyphosis was noted.  He has tenderness over the left SI joint on examination today.  He continues to have intermittent pain and stiffness in both knee joints and both hips.  He has severely limited range of motion of both hips and limited extension of both knees.  He remains on Humira 40 mg subcutaneous injections every 21 days.  He has not noticed any new or worsening symptoms spacing the dose of Humira.  He has not been experiencing any signs or symptoms of uveitis.  He has not had any recent rashes.  In the past he has benefited from physical therapy so a new referral will be placed today to try to improve his mobility and stiffness in his hip joints and knee joints.  He will remain on Humira as prescribed.  He was advised to notify us if  he develops any new or worsening symptoms.  He will follow-up in the office in 5 months.  Plan: Ambulatory referral to Physical Therapy  High risk medication use - Humira 40 mg sq injections every 21 days. TB Gold negative on 12/18/2020 and  will continue to be monitored yearly.  CBC and CMP were updated on 04/23/2021.  He will be due to update lab work in December and every 3 months to monitor for drug toxicity.  Standing orders for CBC and CMP remain in place. Discussed the importance of holding Humira if he develops signs or symptoms of an infection and to resume once the infection has completely cleared. He voiced understanding.   Primary osteoarthritis of both hips -He has severely limited range of motion of both hip joints.  In the past he has considered proceeding with a hip replacement but declined referral to orthopedics at this time.  He did request a referral to physical therapy which she has benefited from in the past.  Plan: Ambulatory referral to Physical Therapy  Primary osteoarthritis of both knees -He has chronic pain and stiffness in both knee joints.  He is able to climb steps but has to go slowly.  On examination he has limited extension of both knee joints but no knee joint effusion was noted.  Referral to physical therapy will be placed today.  Plan: Ambulatory referral to Physical Therapy  Dry eyes: He experiences occasional symptoms of dry eye.  No increased photophobia, eye pain, or conjunctival injection noted.  He uses Systane eyedrops as needed for symptomatic relief.  Raised intraocular pressure of both eyes: Patient reports history of glaucoma. He uses combigan drops as directed.   Other fatigue: Stable. Discussed the importance of regular exercise.   MGUS (monoclonal gammopathy of unknown significance): IgA kappa MGUS.  No evidence of hypogammaglobulinemia. Followed by Dr. Lorenso Courier.  Most recent office visit on 01/26/2021-office visit note was reviewed today in the office.   There is no clear indication for bone marrow biopsy at this time.  He will continue to follow-up with hematology every 6 months.   Orders: Orders Placed This Encounter  Procedures   Ambulatory referral to Physical Therapy    No orders of the defined types were placed in this encounter.     Follow-Up Instructions: Return in about 5 months (around 10/19/2021) for Ankylosing Spondylitis, Osteoarthritis.   Ofilia Neas, PA-C  Note - This record has been created using Dragon software.  Chart creation errors have been sought, but may not always  have been located. Such creation errors do not reflect on  the standard of medical care.

## 2021-05-14 NOTE — Telephone Encounter (Signed)
Received a fax from  [place] regarding an approval for [place] patient assistance from 05/13/21 to 08/08/22.   Phone number: [phone]  [place], PharmD, MPH, BCPS Clinical Pharmacist (Rheumatology and Pulmonology)

## 2021-05-20 DIAGNOSIS — N200 Calculus of kidney: Secondary | ICD-10-CM | POA: Diagnosis not present

## 2021-05-20 DIAGNOSIS — R350 Frequency of micturition: Secondary | ICD-10-CM | POA: Diagnosis not present

## 2021-05-21 ENCOUNTER — Other Ambulatory Visit: Payer: Self-pay

## 2021-05-21 ENCOUNTER — Ambulatory Visit: Payer: Medicare Other | Admitting: Physician Assistant

## 2021-05-21 ENCOUNTER — Encounter: Payer: Self-pay | Admitting: Physician Assistant

## 2021-05-21 ENCOUNTER — Telehealth: Payer: Self-pay | Admitting: Rheumatology

## 2021-05-21 VITALS — BP 107/71 | HR 92 | Ht 73.0 in | Wt 217.8 lb

## 2021-05-21 DIAGNOSIS — R778 Other specified abnormalities of plasma proteins: Secondary | ICD-10-CM

## 2021-05-21 DIAGNOSIS — R5383 Other fatigue: Secondary | ICD-10-CM

## 2021-05-21 DIAGNOSIS — H40053 Ocular hypertension, bilateral: Secondary | ICD-10-CM

## 2021-05-21 DIAGNOSIS — Z79899 Other long term (current) drug therapy: Secondary | ICD-10-CM

## 2021-05-21 DIAGNOSIS — M170 Bilateral primary osteoarthritis of knee: Secondary | ICD-10-CM

## 2021-05-21 DIAGNOSIS — K620 Anal polyp: Secondary | ICD-10-CM

## 2021-05-21 DIAGNOSIS — M160 Bilateral primary osteoarthritis of hip: Secondary | ICD-10-CM

## 2021-05-21 DIAGNOSIS — H04123 Dry eye syndrome of bilateral lacrimal glands: Secondary | ICD-10-CM | POA: Diagnosis not present

## 2021-05-21 DIAGNOSIS — D472 Monoclonal gammopathy: Secondary | ICD-10-CM | POA: Diagnosis not present

## 2021-05-21 DIAGNOSIS — M450 Ankylosing spondylitis of multiple sites in spine: Secondary | ICD-10-CM | POA: Diagnosis not present

## 2021-05-21 NOTE — Telephone Encounter (Signed)
LMOM for patient to advise where he would like to go for PT.

## 2021-05-21 NOTE — Telephone Encounter (Signed)
Patient was wondering if there is any PT facilities near [place] that he might go to instead of [place]. [name] area is just a little closer for him. Please call to discuss.

## 2021-05-21 NOTE — Patient Instructions (Signed)
Standing Labs We placed an order today for your standing lab work.   Please have your standing labs drawn in December and every 3 months    If possible, please have your labs drawn 2 weeks prior to your appointment so that the provider can discuss your results at your appointment.  Please note that you may see your imaging and lab results in MyChart before we have reviewed them. We may be awaiting multiple results to interpret others before contacting you. Please allow our office up to 72 hours to thoroughly review all of the results before contacting the office for clarification of your results.  We have open lab daily: Monday through Thursday from 1:30-4:30 PM and Friday from 1:30-4:00 PM at the office of Dr. Shaili Deveshwar, [facility].   Please be advised, all patients with office appointments requiring lab work will take precedent over walk-in lab work.  If possible, please come for your lab work on Monday and Friday afternoons, as you may experience shorter wait times. The office is located at [address], [address], [place], [place] [address] No appointment is necessary.   Labs are drawn by [facility]. Please bring your co-pay at the time of your lab draw.  You may receive a bill from [facility] for your lab work.  If you wish to have your labs drawn at another location, please call the office 24 hours in advance to send orders.  If you have any questions regarding directions or hours of operation,  please call [phone].   As a reminder, please drink plenty of water prior to coming for your lab work. Thanks!  

## 2021-05-26 NOTE — Telephone Encounter (Signed)
I called patient, patient will stay with [facility].

## 2021-06-01 ENCOUNTER — Encounter: Payer: Self-pay | Admitting: Physical Therapy

## 2021-06-01 ENCOUNTER — Other Ambulatory Visit: Payer: Self-pay

## 2021-06-01 ENCOUNTER — Ambulatory Visit: Payer: Medicare Other | Admitting: Physical Therapy

## 2021-06-01 DIAGNOSIS — R2689 Other abnormalities of gait and mobility: Secondary | ICD-10-CM

## 2021-06-01 DIAGNOSIS — M545 Low back pain, unspecified: Secondary | ICD-10-CM | POA: Diagnosis not present

## 2021-06-01 DIAGNOSIS — M6281 Muscle weakness (generalized): Secondary | ICD-10-CM

## 2021-06-01 DIAGNOSIS — M542 Cervicalgia: Secondary | ICD-10-CM | POA: Diagnosis not present

## 2021-06-01 DIAGNOSIS — R293 Abnormal posture: Secondary | ICD-10-CM

## 2021-06-01 DIAGNOSIS — G8929 Other chronic pain: Secondary | ICD-10-CM | POA: Diagnosis not present

## 2021-06-01 NOTE — Therapy (Signed)
[facility] Physical Therapy [address] [place], [place], [address] Phone: [phone]   Fax:  [phone]  Physical Therapy Evaluation  Patient Details  Name: Antonio Ayers MRN: [id number] Date of Birth: May 27, 1951 Referring Provider (PT): Ofilia Neas, PA-C   Encounter Date: 06/01/2021   PT End of Session - 06/01/21 1053     Visit Number 1    Number of Visits 15    Date for PT Re-Evaluation 08/10/21    Authorization Type UHC MCR    Progress Note Due on Visit 10    PT Start Time 0932    PT Stop Time 1016    PT Time Calculation (min) 44 min    Activity Tolerance Patient tolerated treatment well    Behavior During Therapy [facility] for tasks assessed/performed             Past Medical History:  Diagnosis Date   Abdominal hernia    Agatston coronary artery calcium score greater than 400    coronary Ca 454 on CT 2021   Ankylosing spondylitis ([place])    Aortic aneurysm (HCC)    mildly dilated ascending aorta at 64mm and aortic root 66mm by echo 03/2020   Aortic insufficiency    Chronic diarrhea    Colon polyp    Complication of anesthesia    GERD (gastroesophageal reflux disease)    Headache    Heart murmur    History of hiatal hernia    History of kidney stones    PONV (postoperative nausea and vomiting)     Past Surgical History:  Procedure Laterality Date   CATARACT EXTRACTION Left 2016   CHOLECYSTECTOMY  01/2018   COLONOSCOPY     02/2015   CORNEAL TRANSPLANT  1989, 1992, 2014   HEMORRHOID SURGERY N/A 03/20/2021   Procedure: HEMORRHOIDECTOMY WITH LIGATION AND HEMORRHOIDOPEXY;  Surgeon: Michael Boston, MD;  Location: [facility];  Service: General;  Laterality: N/A;   HERNIA REPAIR  2018   HIATAL HERNIA REPAIR     KIDNEY STONE SURGERY  03/2018   MASS EXCISION N/A 03/20/2021   Procedure: REMOVAL OF ANAL polyp x2;  Surgeon: Michael Boston, MD;  Location: [facility];  Service: General;  Laterality: N/A;   PROSTATE BIOPSY     x3   RECTAL EXAM UNDER  ANESTHESIA N/A 03/20/2021   Procedure: ANORECTAL EXAM UNDER ANESTHESIA;  Surgeon: Michael Boston, MD;  Location: [facility];  Service: General;  Laterality: N/A;   UPPER GASTROINTESTINAL ENDOSCOPY      There were no vitals filed for this visit.    Subjective Assessment - 06/01/21 0935     Subjective relays chronic pain and stiffness from Ankylosing spondylitis of multiple sites in spine (HCC)and OA of both hips and knees. He has pain and difficulty after sitting too long and with climbing stairs. Pain is mostly in neck, shoulders, back and hip flexors, he takes humira injections every 3 weeks. He has PT in the past mostly stretching has helped    Pertinent History PMH: OA,GERD,aortic aneurysm    Diagnostic tests XR    Patient Stated Goals reduce pain and stiffness    Currently in Pain? Yes    Pain Score 6     Pain Location Other (Comment)   neck, back, shoulder, hip flexors   Pain Orientation Right;Left    Pain Descriptors / Indicators Aching;Sharp;Tightness    Pain Type Chronic pain    Pain Radiating Towards down his shoulders and hips    Pain Onset More than  a month ago    Pain Frequency Intermittent    Aggravating Factors  sitting too long and with climbing stairs    Pain Relieving Factors stretches, thi chi, NSAIDs, humira injections                [facility] PT Assessment - 06/01/21 0001       Assessment   Medical Diagnosis M45.0 (ICD-10-CM) - Ankylosing spondylitis of multiple sites in spine (HCC)and OA of both hips and knees    Referring Provider (PT) Ofilia Neas, PA-C    Onset Date/Surgical Date --   chronic pain > 1 year   Next MD Visit 10/22/20    Prior Therapy nothing recent but has done PT in past for this      Precautions   Precautions None      Restrictions   Weight Bearing Restrictions No      Balance Screen   Has the patient fallen in the past 6 months Yes    How many times? 1   off of curb   Has the patient had a decrease in activity level because of a fear  of falling?  No   not if he has cane with him   Is the patient reluctant to leave their home because of a fear of falling?  No      Home Environment   Living Environment Private residence    Additional Comments 14 steps to bedroom with handrail on Rt going up      Prior Function   Level of Independence Independent with basic ADLs    Vocation Retired    Leisure relax      Cognition   Overall Cognitive Status Within Functional Limits for tasks assessed      Observation/Other Assessments   Focus on Therapeutic Outcomes (FOTO)  not done, too many areas      Functional Tests   Functional tests Other;Other2      Other:   Other/ Comments 5 times sit to stand he must use UE support to push up 24 seconds      Other:   Other/Comments TUG time 15 seconds with SPC      Posture/Postural Control   Posture Comments slumped posture, forward head      ROM / Strength   AROM / PROM / Strength AROM;Strength      AROM   Overall AROM Comments bilat shoulder scaption to 120 deg, Rt hip flexion to 100, Lt hip flexion to 95 in sitting, hip IR limited to neutral, no able to move beyond this    AROM Assessment Site Cervical;Lumbar    Cervical Flexion 30    Cervical Extension 3    Cervical - Right Side Bend 5    Cervical - Left Side Bend 5    Cervical - Right Rotation 25    Cervical - Left Rotation 30    Lumbar Flexion 25%    Lumbar Extension 25%    Lumbar - Right Side Bend 25%    Lumbar - Left Side Bend 50%    Lumbar - Right Rotation 25%    Lumbar - Left Rotation 25%      Strength   Overall Strength Comments gross UE strength 4/5, gross LE strength 4+/5      Ambulation/Gait   Ambulation/Gait Yes    Ambulation/Gait Assistance 6: Modified independent (Device/Increase time)    Ambulation Distance (Feet) 100 Feet    Assistive device Straight cane    Gait Comments  slower velocity, slumped posture, decreased hip flexion                        Objective measurements  completed on examination: See above findings.       [facility] Adult PT Treatment/Exercise - 06/01/21 0001       Exercises   Exercises Neck;Lumbar      Lumbar Exercises: Aerobic   Nustep L5 X5 min LE/UE                     PT Education - 06/01/21 1053     Education Details HEP,POC    Person(s) Educated Patient    Methods Explanation;Demonstration;Verbal cues;Handout    Comprehension Verbalized understanding;Need further instruction              PT Short Term Goals - 06/01/21 1109       PT SHORT TERM GOAL #1   Title Pt will be I and compliant with intial HEP    Time 4    Period Weeks    Status New    Target Date 06/29/21      PT SHORT TERM GOAL #2   Title Pt will improve neck ROM by 5 deg each plane    Time 4    Period Weeks    Status New               PT Long Term Goals - 06/01/21 1112       PT LONG TERM GOAL #1   Title Pt will report less than 4/10 overall pain with ADL's    Time 10    Period Weeks    Status New    Target Date 08/10/21      PT LONG TERM GOAL #2   Title Pt will improve neck ROM by 10 deg each plane for improved scanning of his enviornment    Time 10    Status New      PT LONG TERM GOAL #3   Title Pt will report improved ability to navigate stairs and have less pain with this    Time 10    Period Weeks    Status New      PT LONG TERM GOAL #4   Title Pt will improve 5TSTS test to <20 seconds to show improved muscular endurance      PT LONG TERM GOAL #5   Title Pt will improve TUG time to 13 seconds with LRAD to show improved gait speed and balance    Time 10    Period Weeks    Status New    Target Date 08/10/21                    Plan - 06/01/21 1055     Clinical Impression Statement Pt presents with chronic neck and back pain with stiffness and decreased ROM from anklosing Spondylitis with bilateral hip and knee OA. He will benefit from skilled PT to improve funcitonal mobility, functional strenth  and reduce pain.    Personal Factors and Comorbidities Comorbidity 3+;Time since onset of injury/illness/exacerbation    Comorbidities HYQ:MVHQIONGE spine, bilat hip/knee OA,GERD,aortic aneurysm    Examination-Activity Limitations Bend;Carry;Lift;Stand;Stairs;Squat;Locomotion Level;Reach Overhead;Transfers    Examination-Participation Restrictions Cleaning;Community Activity;Driving    Stability/Clinical Decision Making Evolving/Moderate complexity    Clinical Decision Making Moderate    Rehab Potential Good    PT Frequency 2x / week    PT Duration Other (comment)  10 weeks   PT Treatment/Interventions Aquatic Therapy;Cryotherapy;Programmer, systems;Therapeutic activities;Therapeutic exercise;Neuromuscular re-education;Manual techniques;Dry needling;Passive range of motion;Taping;Joint Manipulations    PT Next Visit Plan needs overall neck/lumbar/hip ROM, strength and ROM for stairs, how was HEP?    PT Home Exercise Plan Access Code: [email or web address]    Consulted and Agree with Plan of Care Patient             Patient will benefit from skilled therapeutic intervention in order to improve the following deficits and impairments:  Decreased activity tolerance, Decreased balance, Decreased endurance, Decreased mobility, Decreased range of motion, Decreased strength, Difficulty walking, Hypomobility, Postural dysfunction, Other (comment), Pain  Visit Diagnosis: Cervicalgia  Chronic bilateral low back pain without sciatica  Other abnormalities of gait and mobility  Muscle weakness (generalized)  Abnormal posture     Problem List Patient Active Problem List   Diagnosis Date Noted   Agatston coronary artery calcium score greater than 400    Aortic aneurysm (HCC)    Aortic valve regurgitation 01/09/2019   Nephrolithiasis 01/09/2019   BPH (benign prostatic hyperplasia) 01/09/2019   Overweight (BMI 25.0-29.9) 01/09/2019   Primary  osteoarthritis of both hips 12/29/2018   Primary osteoarthritis of both knees 12/29/2018   Raised intraocular pressure of both eyes 12/19/2018   Ankylosing spondylitis of multiple sites in spine ([place]) 12/19/2018   Hiatal hernia with GERD 11/08/2018   History of colonoscopy 01/07/2018   Gall stone in bile duct with infection of gallbladder 12/14/2017   Hernia of abdominal cavity 12/07/2016   Dislocated elbow, left, initial encounter 10/07/2016   Enlarged liver 12/08/2006   Aortic anomaly 01/07/2005   Fracture 02/17/2004   Keratoconus 12/07/1977    Debbe Odea, PT,DPT 06/01/2021, 11:19 AM  [facility] Physical Therapy [address] [place], [place], [address] Phone: [phone]   Fax:  [phone]  Name: Antonio Ayers MRN: [id number] Date of Birth: May 25, 1951

## 2021-06-01 NOTE — Patient Instructions (Signed)
Access Code: [email or web address] URL: [email or web address] Date: 06/01/2021 Prepared by: Elsie Ra  Exercises Seated Cervical Sidebending Stretch - 2 x daily - 6 x weekly - 1 sets - 2-3 reps - 30 sec hold Seated Assisted Cervical Rotation with Towel - 2 x daily - 6 x weekly - 1 sets - 10 reps - 5 hold Cervical Extension AROM with Strap - 2 x daily - 6 x weekly - 3 sets - 10 reps Hip Flexor Stretch at Edge of Bed - 2 x daily - 6 x weekly - 3 sets - 10 reps Supine Lower Trunk Rotation - 2 x daily - 6 x weekly - 1 sets - 10 reps - 5 sec hold Mini Squat with Counter Support - 2 x daily - 6 x weekly - 1-2 sets - 10 reps

## 2021-06-03 DIAGNOSIS — M6281 Muscle weakness (generalized): Secondary | ICD-10-CM | POA: Diagnosis not present

## 2021-06-03 DIAGNOSIS — R350 Frequency of micturition: Secondary | ICD-10-CM | POA: Diagnosis not present

## 2021-06-03 DIAGNOSIS — M6289 Other specified disorders of muscle: Secondary | ICD-10-CM | POA: Diagnosis not present

## 2021-06-03 DIAGNOSIS — M62838 Other muscle spasm: Secondary | ICD-10-CM | POA: Diagnosis not present

## 2021-06-04 DIAGNOSIS — H401132 Primary open-angle glaucoma, bilateral, moderate stage: Secondary | ICD-10-CM | POA: Diagnosis not present

## 2021-06-05 ENCOUNTER — Encounter: Payer: Medicare Other | Admitting: Rehabilitative and Restorative Service Providers"

## 2021-06-09 ENCOUNTER — Telehealth: Payer: Self-pay

## 2021-06-09 NOTE — Telephone Encounter (Signed)
Caller states he has been having dizzy spells on and off and has spoken to his doctor about it. He is staying hydrated. States he has been having the dizzy spells and headache that comes and goes.  06/09/2021 12:20:12 PM SEE PCP WITHIN 3 DAYS Yes [place], RN, Leilani Merl Complies Caller Understands Yes  06/09/21 1324 LVM instructions for pt to call back to get appt rescheduled to sooner date with any available provider. Noted that appt was made for 06/16/21 with PCP.

## 2021-06-10 ENCOUNTER — Other Ambulatory Visit: Payer: Self-pay

## 2021-06-10 ENCOUNTER — Ambulatory Visit: Payer: Medicare Other | Admitting: Physical Therapy

## 2021-06-10 DIAGNOSIS — R2689 Other abnormalities of gait and mobility: Secondary | ICD-10-CM | POA: Diagnosis not present

## 2021-06-10 DIAGNOSIS — G8929 Other chronic pain: Secondary | ICD-10-CM

## 2021-06-10 DIAGNOSIS — R293 Abnormal posture: Secondary | ICD-10-CM | POA: Diagnosis not present

## 2021-06-10 DIAGNOSIS — M542 Cervicalgia: Secondary | ICD-10-CM

## 2021-06-10 DIAGNOSIS — M545 Low back pain, unspecified: Secondary | ICD-10-CM

## 2021-06-10 DIAGNOSIS — M6281 Muscle weakness (generalized): Secondary | ICD-10-CM | POA: Diagnosis not present

## 2021-06-10 NOTE — Therapy (Signed)
[facility] Physical Therapy [address] [place], [place], [address] Phone: [phone]   Fax:  [phone]  Physical Therapy Treatment  Patient Details  Name: Antonio Ayers MRN: [id number] Date of Birth: 1950-10-24 Referring Provider (PT): Ofilia Neas, PA-C   Encounter Date: 06/10/2021   PT End of Session - 06/10/21 1042     Visit Number 2    Number of Visits 15    Date for PT Re-Evaluation 08/10/21    Authorization Type UHC MCR    Progress Note Due on Visit 10    PT Start Time 1009    PT Stop Time 1053    PT Time Calculation (min) 44 min    Activity Tolerance Patient tolerated treatment well    Behavior During Therapy [facility] for tasks assessed/performed             Past Medical History:  Diagnosis Date   Abdominal hernia    Agatston coronary artery calcium score greater than 400    coronary Ca 454 on CT 2021   Ankylosing spondylitis ([place])    Aortic aneurysm (HCC)    mildly dilated ascending aorta at 68mm and aortic root 79mm by echo 03/2020   Aortic insufficiency    Chronic diarrhea    Colon polyp    Complication of anesthesia    GERD (gastroesophageal reflux disease)    Headache    Heart murmur    History of hiatal hernia    History of kidney stones    PONV (postoperative nausea and vomiting)     Past Surgical History:  Procedure Laterality Date   CATARACT EXTRACTION Left 2016   CHOLECYSTECTOMY  01/2018   COLONOSCOPY     02/2015   CORNEAL TRANSPLANT  1989, 1992, 2014   HEMORRHOID SURGERY N/A 03/20/2021   Procedure: HEMORRHOIDECTOMY WITH LIGATION AND HEMORRHOIDOPEXY;  Surgeon: Michael Boston, MD;  Location: [facility];  Service: General;  Laterality: N/A;   HERNIA REPAIR  2018   HIATAL HERNIA REPAIR     KIDNEY STONE SURGERY  03/2018   MASS EXCISION N/A 03/20/2021   Procedure: REMOVAL OF ANAL polyp x2;  Surgeon: Michael Boston, MD;  Location: [facility];  Service: General;  Laterality: N/A;   PROSTATE BIOPSY     x3   RECTAL EXAM UNDER  ANESTHESIA N/A 03/20/2021   Procedure: ANORECTAL EXAM UNDER ANESTHESIA;  Surgeon: Michael Boston, MD;  Location: [facility];  Service: General;  Laterality: N/A;   UPPER GASTROINTESTINAL ENDOSCOPY      There were no vitals filed for this visit.   Subjective Assessment - 06/10/21 1016     Subjective relays he has stiffness to report not really pain today, he has been getting dizzy and will follow up with MD about this.    Pertinent History PMH: OA,GERD,aortic aneurysm    Diagnostic tests XR    Patient Stated Goals reduce pain and stiffness    Pain Onset More than a month ago                [facility] Adult PT Treatment/Exercise - 06/10/21 0001       Neck Exercises: Seated   Cervical Rotation Limitations 12 reps bilat holding 5 sec with towel AAROM    Other Seated Exercise pulleys 2 min flexion, 2 min abd    Other Seated Exercise rows and extensions bilat with red X20 ea, sitting away from back of chair for increased core      Lumbar Exercises: Stretches   Lower Trunk Rotation  5 reps;10 seconds    Other Lumbar Stretch Exercise standing hip flexor stretch with foot on 4 inch step (tried 6 inch step but too tall for him) 3X20 seconds bilat      Lumbar Exercises: Aerobic   Nustep L6 X 7 min LE/UE      Lumbar Exercises: Machines for Strengthening   Leg Press DL 50# 3X10 at seat #5(try a little more weight next time and seat #4)      Manual Therapy   Manual therapy comments bilat hip/knee flexion PROM, hip IR/ER PROM, manual hamstring stretching, bilateral long axis distraction, bilateral neck sidebending PROM                       PT Short Term Goals - 06/01/21 1109       PT SHORT TERM GOAL #1   Title Pt will be I and compliant with intial HEP    Time 4    Period Weeks    Status New    Target Date 06/29/21      PT SHORT TERM GOAL #2   Title Pt will improve neck ROM by 5 deg each plane    Time 4    Period Weeks    Status New               PT Long Term  Goals - 06/01/21 1112       PT LONG TERM GOAL #1   Title Pt will report less than 4/10 overall pain with ADL's    Time 10    Period Weeks    Status New    Target Date 08/10/21      PT LONG TERM GOAL #2   Title Pt will improve neck ROM by 10 deg each plane for improved scanning of his enviornment    Time 10    Status New      PT LONG TERM GOAL #3   Title Pt will report improved ability to navigate stairs and have less pain with this    Time 10    Period Weeks    Status New      PT LONG TERM GOAL #4   Title Pt will improve 5TSTS test to <20 seconds to show improved muscular endurance      PT LONG TERM GOAL #5   Title Pt will improve TUG time to 13 seconds with LRAD to show improved gait speed and balance    Time 10    Period Weeks    Status New    Target Date 08/10/21                   Plan - 06/10/21 1047     Clinical Impression Statement Session focused on overall ROM and strength for neck/shoulders/lumbar/knees to this tolerance. We reviewed HEP and he showed good understanding of this but did need cues for proper technique of cervical rotation SNAG. Also his bed is too low for the supine hip flexor stretch so we changed this to standing version. Continue current POC.    Personal Factors and Comorbidities Comorbidity 3+;Time since onset of injury/illness/exacerbation    Comorbidities [id number] spine, bilat hip/knee OA,GERD,aortic aneurysm    Examination-Activity Limitations Bend;Carry;Lift;Stand;Stairs;Squat;Locomotion Level;Reach Overhead;Transfers    Examination-Participation Restrictions Cleaning;Community Activity;Driving    Stability/Clinical Decision Making Evolving/Moderate complexity    Rehab Potential Good    PT Frequency 2x / week    PT Duration Other (comment)   10 weeks  PT Treatment/Interventions Aquatic Therapy;Cryotherapy;Programmer, systems;Therapeutic activities;Therapeutic  exercise;Neuromuscular re-education;Manual techniques;Dry needling;Passive range of motion;Taping;Joint Manipulations    PT Next Visit Plan needs overall neck/lumbar/hip ROM, strength and ROM for stairs    PT Home Exercise Plan Access Code: [email or web address]    Consulted and Agree with Plan of Care Patient             Patient will benefit from skilled therapeutic intervention in order to improve the following deficits and impairments:  Decreased activity tolerance, Decreased balance, Decreased endurance, Decreased mobility, Decreased range of motion, Decreased strength, Difficulty walking, Hypomobility, Postural dysfunction, Other (comment), Pain  Visit Diagnosis: Cervicalgia  Chronic bilateral low back pain without sciatica  Other abnormalities of gait and mobility  Muscle weakness (generalized)  Abnormal posture     Problem List Patient Active Problem List   Diagnosis Date Noted   Agatston coronary artery calcium score greater than 400    Aortic aneurysm (HCC)    Aortic valve regurgitation 01/09/2019   Nephrolithiasis 01/09/2019   BPH (benign prostatic hyperplasia) 01/09/2019   Overweight (BMI 25.0-29.9) 01/09/2019   Primary osteoarthritis of both hips 12/29/2018   Primary osteoarthritis of both knees 12/29/2018   Raised intraocular pressure of both eyes 12/19/2018   Ankylosing spondylitis of multiple sites in spine ([place]) 12/19/2018   Hiatal hernia with GERD 11/08/2018   History of colonoscopy 01/07/2018   Gall stone in bile duct with infection of gallbladder 12/14/2017   Hernia of abdominal cavity 12/07/2016   Dislocated elbow, left, initial encounter 10/07/2016   Enlarged liver 12/08/2006   Aortic anomaly 01/07/2005   Fracture 02/17/2004   Keratoconus 12/07/1977    Debbe Odea, PT,DPT 06/10/2021, 11:03 AM  [facility] Physical Therapy [address] [place], [place], [address] Phone: [phone]   Fax:  [phone]  Name: Antonio Ayers MRN: [id number] Date of Birth: 01/06/1951

## 2021-06-11 ENCOUNTER — Other Ambulatory Visit: Payer: Self-pay

## 2021-06-12 ENCOUNTER — Ambulatory Visit (INDEPENDENT_AMBULATORY_CARE_PROVIDER_SITE_OTHER): Payer: Medicare Other | Admitting: Internal Medicine

## 2021-06-12 ENCOUNTER — Encounter: Payer: Self-pay | Admitting: Physical Therapy

## 2021-06-12 ENCOUNTER — Encounter: Payer: Self-pay | Admitting: Internal Medicine

## 2021-06-12 ENCOUNTER — Ambulatory Visit: Payer: Medicare Other | Admitting: Physical Therapy

## 2021-06-12 VITALS — BP 102/64 | HR 89 | Temp 97.9°F | Wt 219.6 lb

## 2021-06-12 DIAGNOSIS — G8929 Other chronic pain: Secondary | ICD-10-CM

## 2021-06-12 DIAGNOSIS — M542 Cervicalgia: Secondary | ICD-10-CM | POA: Diagnosis not present

## 2021-06-12 DIAGNOSIS — R293 Abnormal posture: Secondary | ICD-10-CM

## 2021-06-12 DIAGNOSIS — R2689 Other abnormalities of gait and mobility: Secondary | ICD-10-CM | POA: Diagnosis not present

## 2021-06-12 DIAGNOSIS — M6281 Muscle weakness (generalized): Secondary | ICD-10-CM

## 2021-06-12 DIAGNOSIS — M545 Low back pain, unspecified: Secondary | ICD-10-CM

## 2021-06-12 DIAGNOSIS — R42 Dizziness and giddiness: Secondary | ICD-10-CM

## 2021-06-12 NOTE — Therapy (Signed)
[facility] Physical Therapy [address] [place], [place], [address] Phone: [phone]   Fax:  [phone]  Physical Therapy Treatment  Patient Details  Name: Antonio Ayers MRN: [id number] Date of Birth: Mar 25, 1951 Referring Provider (PT): Ofilia Neas, PA-C   Encounter Date: 06/12/2021   PT End of Session - 06/12/21 1110     Visit Number 3    Number of Visits 15    Date for PT Re-Evaluation 08/10/21    Authorization Type UHC MCR    Progress Note Due on Visit 10    PT Start Time 1017    PT Stop Time 9480    PT Time Calculation (min) 48 min    Activity Tolerance Patient tolerated treatment well    Behavior During Therapy [facility] for tasks assessed/performed             Past Medical History:  Diagnosis Date   Abdominal hernia    Agatston coronary artery calcium score greater than 400    coronary Ca 454 on CT 2021   Ankylosing spondylitis ([place])    Aortic aneurysm (HCC)    mildly dilated ascending aorta at 69mm and aortic root 69mm by echo 03/2020   Aortic insufficiency    Chronic diarrhea    Colon polyp    Complication of anesthesia    GERD (gastroesophageal reflux disease)    Headache    Heart murmur    History of hiatal hernia    History of kidney stones    PONV (postoperative nausea and vomiting)     Past Surgical History:  Procedure Laterality Date   CATARACT EXTRACTION Left 2016   CHOLECYSTECTOMY  01/2018   COLONOSCOPY     02/2015   CORNEAL TRANSPLANT  1989, 1992, 2014   HEMORRHOID SURGERY N/A 03/20/2021   Procedure: HEMORRHOIDECTOMY WITH LIGATION AND HEMORRHOIDOPEXY;  Surgeon: Michael Boston, MD;  Location: [facility];  Service: General;  Laterality: N/A;   HERNIA REPAIR  2018   HIATAL HERNIA REPAIR     KIDNEY STONE SURGERY  03/2018   MASS EXCISION N/A 03/20/2021   Procedure: REMOVAL OF ANAL polyp x2;  Surgeon: Michael Boston, MD;  Location: [facility];  Service: General;  Laterality: N/A;   PROSTATE BIOPSY     x3   RECTAL EXAM UNDER  ANESTHESIA N/A 03/20/2021   Procedure: ANORECTAL EXAM UNDER ANESTHESIA;  Surgeon: Michael Boston, MD;  Location: [facility];  Service: General;  Laterality: N/A;   UPPER GASTROINTESTINAL ENDOSCOPY      There were no vitals filed for this visit.   Subjective Assessment - 06/12/21 1109     Subjective relays continued difficulty with stiffness but not really having pain, he felt overall less stiff and better after last session but after a day the stiffness was back    Pertinent History PMH: OA,GERD,aortic aneurysm    Diagnostic tests XR    Patient Stated Goals reduce pain and stiffness    Pain Onset More than a month ago                               [facility] Adult PT Treatment/Exercise - 06/12/21 0001       Neck Exercises: Seated   Other Seated Exercise shoulder pulleys 2 min flexion, 2 min abd. neck rotations 15 reps bilat holding 5 sec with towel AAROM, neck extensions 5 sec hold with towel SNAG X15    Other Seated Exercise rows and extensions bilat  with red X20 ea, sitting away from back of chair for increased core      Lumbar Exercises: Aerobic   Nustep L6 X 8 min LE/UE      Lumbar Exercises: Machines for Strengthening   Leg Press DL 62# seat #4 2X15, then 37# SL 2X15 ea side      Manual Therapy   Manual therapy comments bilat hip/knee flexion PROM, hip IR/ER PROM, manual hamstring stretching, bilateral long axis distraction, bilateral neck sidebending PROM                       PT Short Term Goals - 06/01/21 1109       PT SHORT TERM GOAL #1   Title Pt will be I and compliant with intial HEP    Time 4    Period Weeks    Status New    Target Date 06/29/21      PT SHORT TERM GOAL #2   Title Pt will improve neck ROM by 5 deg each plane    Time 4    Period Weeks    Status New               PT Long Term Goals - 06/01/21 1112       PT LONG TERM GOAL #1   Title Pt will report less than 4/10 overall pain with ADL's    Time 10    Period  Weeks    Status New    Target Date 08/10/21      PT LONG TERM GOAL #2   Title Pt will improve neck ROM by 10 deg each plane for improved scanning of his enviornment    Time 10    Status New      PT LONG TERM GOAL #3   Title Pt will report improved ability to navigate stairs and have less pain with this    Time 10    Period Weeks    Status New      PT LONG TERM GOAL #4   Title Pt will improve 5TSTS test to <20 seconds to show improved muscular endurance      PT LONG TERM GOAL #5   Title Pt will improve TUG time to 13 seconds with LRAD to show improved gait speed and balance    Time 10    Period Weeks    Status New    Target Date 08/10/21                   Plan - 06/12/21 1111     Clinical Impression Statement He is not having much pain but contnues to be limited by severe widespread joint stiffness. We focused on general ROM and stretching again today with some strengthening with good toleance and without complaints with any exercises. We will continue to work to improve ROM, strength, and overall function as tolerated.    Personal Factors and Comorbidities Comorbidity 3+;Time since onset of injury/illness/exacerbation    Comorbidities [place]O:VZCHYIFOY spine, bilat hip/knee OA,GERD,aortic aneurysm    Examination-Activity Limitations Bend;Carry;Lift;Stand;Stairs;Squat;Locomotion Level;Reach Overhead;Transfers    Examination-Participation Restrictions Cleaning;Community Activity;Driving    Stability/Clinical Decision Making Evolving/Moderate complexity    Rehab Potential Good    PT Frequency 2x / week    PT Duration Other (comment)   10 weeks   PT Treatment/Interventions Aquatic Therapy;Cryotherapy;Programmer, systems;Therapeutic activities;Therapeutic exercise;Neuromuscular re-education;Manual techniques;Dry needling;Passive range of motion;Taping;Joint Manipulations    PT Next Visit Plan needs overall neck/lumbar/hip ROM,  strength and ROM for goal of stairs    PT Home Exercise Plan Access Code: [email or web address]    Consulted and Agree with Plan of Care Patient             Patient will benefit from skilled therapeutic intervention in order to improve the following deficits and impairments:  Decreased activity tolerance, Decreased balance, Decreased endurance, Decreased mobility, Decreased range of motion, Decreased strength, Difficulty walking, Hypomobility, Postural dysfunction, Other (comment), Pain  Visit Diagnosis: Cervicalgia  Chronic bilateral low back pain without sciatica  Other abnormalities of gait and mobility  Muscle weakness (generalized)  Abnormal posture     Problem List Patient Active Problem List   Diagnosis Date Noted   Agatston coronary artery calcium score greater than 400    Aortic aneurysm (HCC)    Aortic valve regurgitation 01/09/2019   Nephrolithiasis 01/09/2019   BPH (benign prostatic hyperplasia) 01/09/2019   Overweight (BMI 25.0-29.9) 01/09/2019   Primary osteoarthritis of both hips 12/29/2018   Primary osteoarthritis of both knees 12/29/2018   Raised intraocular pressure of both eyes 12/19/2018   Ankylosing spondylitis of multiple sites in spine ([place]) 12/19/2018   Hiatal hernia with GERD 11/08/2018   History of colonoscopy 01/07/2018   Gall stone in bile duct with infection of gallbladder 12/14/2017   Hernia of abdominal cavity 12/07/2016   Dislocated elbow, left, initial encounter 10/07/2016   Enlarged liver 12/08/2006   Aortic anomaly 01/07/2005   Fracture 02/17/2004   Keratoconus 12/07/1977    Debbe Odea, PT,DPT 06/12/2021, 11:14 AM  [facility] Physical Therapy [address] [place], [place], [address] Phone: [phone]   Fax:  [phone]  Name: Jhoan Schmieder MRN: [id number] Date of Birth: 05-13-1951

## 2021-06-12 NOTE — Progress Notes (Signed)
Acute office Visit     This visit occurred during the SARS-CoV-2 public health emergency.  Safety protocols were in place, including screening questions prior to the visit, additional usage of staff PPE, and extensive cleaning of exam room while observing appropriate contact time as indicated for disinfecting solutions.    CC/Reason for Visit: Dizziness  HPI: Antonio Ayers is a 70 y.o. male who is coming in today for the above mentioned reasons.  He had his surgery for large rectal polyp, has been weak ever since and has been attending physical therapy at the recommendation of his rheumatologist who he sees for his history of rheumatoid arthritis.  He had been on indapamide prescribed by his urologist.  He has been having some dizziness.  He has not been taking blood pressures at home.  He discontinued indapamide about a week ago and although the dizziness has improved it still persist.  This is not a sensation of vertigo.  At times he will have a mild headache with it.  His in office blood pressure today is 102/64, he does not take any blood pressure medication.  Past Medical/Surgical History: Past Medical History:  Diagnosis Date   Abdominal hernia    Agatston coronary artery calcium score greater than 400    coronary Ca 454 on CT 2021   Ankylosing spondylitis (HCC)    Aortic aneurysm (HCC)    mildly dilated ascending aorta at 74mm and aortic root 79mm by echo 03/2020   Aortic insufficiency    Chronic diarrhea    Colon polyp    Complication of anesthesia    GERD (gastroesophageal reflux disease)    Headache    Heart murmur    History of hiatal hernia    History of kidney stones    PONV (postoperative nausea and vomiting)     Past Surgical History:  Procedure Laterality Date   CATARACT EXTRACTION Left 2016   CHOLECYSTECTOMY  01/2018   COLONOSCOPY     02/2015   CORNEAL TRANSPLANT  1989, 1992, 2014   HEMORRHOID SURGERY N/A 03/20/2021   Procedure: HEMORRHOIDECTOMY WITH  LIGATION AND HEMORRHOIDOPEXY;  Surgeon: Michael Boston, MD;  [facility];  Service: General;  Laterality: N/A;   HERNIA REPAIR  2018   HIATAL HERNIA REPAIR     KIDNEY STONE SURGERY  03/2018   MASS EXCISION N/A 03/20/2021   Procedure: REMOVAL OF ANAL polyp x2;  Surgeon: Michael Boston, MD;  [facility]: [facility];  Service: General;  Laterality: N/A;   PROSTATE BIOPSY     x3   RECTAL EXAM UNDER ANESTHESIA N/A 03/20/2021   Procedure: ANORECTAL EXAM UNDER ANESTHESIA;  Surgeon: Michael Boston, MD;  [facility];  Service: General;  Laterality: N/A;   UPPER GASTROINTESTINAL ENDOSCOPY      Social History:  reports that he has never smoked. He has never used smokeless tobacco. He reports current alcohol use. He reports that he does not use drugs.  Allergies: No Known Allergies  Family History:  Family History  Problem Relation Age of Onset   Stroke Mother    Stroke Father    Heart failure Father    Diabetes Sister    Obesity Sister    Hiatal hernia Sister    Colon cancer Neg Hx    Esophageal cancer Neg Hx    Rectal cancer Neg Hx    Stomach cancer Neg Hx      Current Outpatient Medications:    acetaminophen (TYLENOL) 325 MG tablet, Take  325 mg by mouth every 6 (six) hours as needed for mild pain or moderate pain., Disp: , Rfl:    Adalimumab (HUMIRA PEN) 40 MG/0.4ML PNKT, Inject 40 mg into the skin every 21 ( twenty-one) days., Disp: 4 each, Rfl: 0   atorvastatin (LIPITOR) 20 MG tablet, Take 1 tablet (20 mg total) by mouth daily., Disp: 90 tablet, Rfl: 3   brimonidine-timolol (COMBIGAN) 0.2-0.5 % ophthalmic solution, Place 1 drop into both eyes 2 (two) times daily., Disp: , Rfl:    Cholecalciferol 25 MCG (1000 UT) capsule, Take 1,000 Units by mouth daily. , Disp: , Rfl:    LOTEMAX 0.5 % GEL, Place 1 drop into the left eye daily., Disp: , Rfl:    polyethylene glycol (MIRALAX / GLYCOLAX) 17 g packet, Take 17 g by mouth daily as needed (Constipation)., Disp: , Rfl:    potassium citrate  (UROCIT-K) 10 MEQ (1080 MG) SR tablet, Take 10 mEq by mouth 3 (three) times daily., Disp: , Rfl:    psyllium (METAMUCIL SMOOTH TEXTURE) 28 % packet, Take 1 packet by mouth 2 (two) times daily., Disp: , Rfl:    latanoprost (XALATAN) 0.005 % ophthalmic solution, Place 1 drop into both eyes at bedtime., Disp: , Rfl:   Review of Systems:  Constitutional: Denies fever, chills, diaphoresis, appetite change and fatigue.  HEENT: Denies photophobia, eye pain, redness, hearing loss, ear pain, congestion, sore throat, rhinorrhea, sneezing, mouth sores, trouble swallowing, neck pain, neck stiffness and tinnitus.   Respiratory: Denies SOB, DOE, cough, chest tightness,  and wheezing.   Cardiovascular: Denies chest pain, palpitations and leg swelling.  Gastrointestinal: Denies nausea, vomiting, abdominal pain, diarrhea, constipation, blood in stool and abdominal distention.  Genitourinary: Denies dysuria, urgency, frequency, hematuria, flank pain and difficulty urinating.  Endocrine: Denies: hot or cold intolerance, sweats, changes in hair or nails, polyuria, polydipsia. Musculoskeletal: Denies myalgias, back pain, joint swelling, arthralgias and gait problem.  Skin: Denies pallor, rash and wound.  Neurological: Denies  seizures, syncope, weakness,  numbness and headaches.  Hematological: Denies adenopathy. Easy bruising, personal or family bleeding history  Psychiatric/Behavioral: Denies suicidal ideation, mood changes, confusion, nervousness, sleep disturbance and agitation    Physical Exam: Vitals:   06/12/21 1602  BP: 102/64  Pulse: 89  Temp: 97.9 F (36.6 C)  TempSrc: Oral  SpO2: 96%  Weight: 219 lb 9.6 oz (99.6 kg)    Body mass index is 28.97 kg/m.   Constitutional: NAD, calm, comfortable Eyes: PERRL, lids and conjunctivae normal, wears corrective lenses ENMT: Mucous membranes are moist.   Psychiatric: Normal judgment and insight. Alert and oriented x 3. Normal mood.    Impression  and Plan:  Dizziness -He is not orthostatic. -I suspect it is related to indapamide as he has already had some improvement off of it and suspect will continue to get better the longer it has been since his last dose. -Nonetheless have advised that he stay hydrated and check his blood pressure at home when he feels dizzy.    Lelon Frohlich, MD [facility]

## 2021-06-16 ENCOUNTER — Ambulatory Visit: Payer: Medicare Other | Admitting: Internal Medicine

## 2021-06-17 ENCOUNTER — Encounter: Payer: Medicare Other | Admitting: Rehabilitative and Restorative Service Providers"

## 2021-06-17 ENCOUNTER — Telehealth: Payer: Self-pay | Admitting: Rehabilitative and Restorative Service Providers"

## 2021-06-17 NOTE — Telephone Encounter (Signed)
Called and spoke to patient about no show.  Pt. Indicated he got his times mixed up.  Reminded him of next appointment time.   Scot Jun, PT, DPT, OCS, ATC 06/17/21  9:50 AM

## 2021-06-19 ENCOUNTER — Encounter: Payer: Medicare Other | Admitting: Rehabilitative and Restorative Service Providers"

## 2021-06-19 ENCOUNTER — Other Ambulatory Visit: Payer: Self-pay

## 2021-06-23 ENCOUNTER — Other Ambulatory Visit: Payer: Self-pay

## 2021-06-23 ENCOUNTER — Ambulatory Visit: Payer: Medicare Other | Admitting: Physical Therapy

## 2021-06-23 DIAGNOSIS — M542 Cervicalgia: Secondary | ICD-10-CM

## 2021-06-23 DIAGNOSIS — M6281 Muscle weakness (generalized): Secondary | ICD-10-CM | POA: Diagnosis not present

## 2021-06-23 DIAGNOSIS — R293 Abnormal posture: Secondary | ICD-10-CM

## 2021-06-23 DIAGNOSIS — R2689 Other abnormalities of gait and mobility: Secondary | ICD-10-CM

## 2021-06-23 DIAGNOSIS — G8929 Other chronic pain: Secondary | ICD-10-CM | POA: Diagnosis not present

## 2021-06-23 DIAGNOSIS — M545 Low back pain, unspecified: Secondary | ICD-10-CM | POA: Diagnosis not present

## 2021-06-23 NOTE — Therapy (Signed)
[facility] Physical Therapy [address] [place], [place], [address] Phone: [phone]   Fax:  [phone]  Physical Therapy Treatment  Patient Details  Name: Antonio Ayers MRN: [id number] Date of Birth: [birth year] Referring Provider (PT): Ofilia Neas, PA-C   Encounter Date: 06/23/2021   PT End of Session - 06/23/21 1114     Visit Number 4    Number of Visits 15    Date for PT Re-Evaluation 08/10/21    Authorization Type UHC MCR    Progress Note Due on Visit 10    PT Start Time 5400    PT Stop Time 1057    PT Time Calculation (min) 42 min    Activity Tolerance Patient tolerated treatment well    Behavior During Therapy [facility] for tasks assessed/performed             Past Medical History:  Diagnosis Date   Abdominal hernia    Agatston coronary artery calcium score greater than 400    coronary Ca 454 on CT 2021   Ankylosing spondylitis ([place])    Aortic aneurysm (HCC)    mildly dilated ascending aorta at 57mm and aortic root 75mm by echo 03/2020   Aortic insufficiency    Chronic diarrhea    Colon polyp    Complication of anesthesia    GERD (gastroesophageal reflux disease)    Headache    Heart murmur    History of hiatal hernia    History of kidney stones    PONV (postoperative nausea and vomiting)     Past Surgical History:  Procedure Laterality Date   CATARACT EXTRACTION Left 2016   CHOLECYSTECTOMY  01/2018   COLONOSCOPY     02/2015   CORNEAL TRANSPLANT  1989, 1992, 2014   HEMORRHOID SURGERY N/A 03/20/2021   Procedure: HEMORRHOIDECTOMY WITH LIGATION AND HEMORRHOIDOPEXY;  Surgeon: Michael Boston, MD;  [facility];  Service: General;  Laterality: N/A;   HERNIA REPAIR  2018   HIATAL HERNIA REPAIR     KIDNEY STONE SURGERY  03/2018   MASS EXCISION N/A 03/20/2021   Procedure: REMOVAL OF ANAL polyp x2;  Surgeon: Michael Boston, MD;  [facility];  Service: General;  Laterality: N/A;   PROSTATE BIOPSY     x3   RECTAL EXAM UNDER  ANESTHESIA N/A 03/20/2021   Procedure: ANORECTAL EXAM UNDER ANESTHESIA;  Surgeon: Michael Boston, MD;  [facility];  Service: General;  Laterality: N/A;   UPPER GASTROINTESTINAL ENDOSCOPY      There were no vitals filed for this visit.   Subjective Assessment - 06/23/21 1113     Subjective relays he missed last week due to dizziness, went to see MD about this and will follow up again. He thinks it is BP related. He does say it has improved some this week but still has it. He is not having pain but does complaint of soreness and stiffness in his hips/knees.    Pertinent History PMH: OA,GERD,aortic aneurysm    Diagnostic tests XR    Patient Stated Goals reduce pain and stiffness    Pain Onset More than a month ago               [facility] Adult PT Treatment/Exercise - 06/23/21 0001       Neck Exercises: Seated   Other Seated Exercise shoulder pulleys 2 min flexion, 2 min abd.      Lumbar Exercises: Aerobic   Nustep L6 X 8 min LE/UE  Lumbar Exercises: Machines for Strengthening   Leg Press DL 75# seat #4 2X15, then 37# SL 2X15 ea side      Lumbar Exercises: Standing   Row --    Theraband Level (Row) Level 4 (Blue)    Row Limitations 2X15    Other Standing Lumbar Exercises TRX squats 2X10      Manual Therapy   Manual therapy comments bilat hip/knee flexion PROM,  manual hamstring stretching, bilateral long axis distraction, bilateral neck sidebending and rotation PROM, neck extension and flexion PROM                       PT Short Term Goals - 06/01/21 1109       PT SHORT TERM GOAL #1   Title Pt will be I and compliant with intial HEP    Time 4    Period Weeks    Status New    Target Date 06/29/21      PT SHORT TERM GOAL #2   Title Pt will improve neck ROM by 5 deg each plane    Time 4    Period Weeks    Status New               PT Long Term Goals - 06/01/21 1112       PT LONG TERM GOAL #1   Title Pt will report less than 4/10 overall  pain with ADL's    Time 10    Period Weeks    Status New    Target Date 08/10/21      PT LONG TERM GOAL #2   Title Pt will improve neck ROM by 10 deg each plane for improved scanning of his enviornment    Time 10    Status New      PT LONG TERM GOAL #3   Title Pt will report improved ability to navigate stairs and have less pain with this    Time 10    Period Weeks    Status New      PT LONG TERM GOAL #4   Title Pt will improve 5TSTS test to <20 seconds to show improved muscular endurance      PT LONG TERM GOAL #5   Title Pt will improve TUG time to 13 seconds with LRAD to show improved gait speed and balance    Time 10    Period Weeks    Status New    Target Date 08/10/21                   Plan - 06/23/21 1115     Clinical Impression Statement We progressed his overall strength program with good tolerance. We will continue to work to improve his functional mobilities and ROM as tolerated.    Personal Factors and Comorbidities Comorbidity 3+;Time since onset of injury/illness/exacerbation    Comorbidities [place]:ZGYFVCBSW spine, bilat hip/knee OA,GERD,aortic aneurysm    Examination-Activity Limitations Bend;Carry;Lift;Stand;Stairs;Squat;Locomotion Level;Reach Overhead;Transfers    Examination-Participation Restrictions Cleaning;Community Activity;Driving    Stability/Clinical Decision Making Evolving/Moderate complexity    Rehab Potential Good    PT Frequency 2x / week    PT Duration Other (comment)   10 weeks   PT Treatment/Interventions Aquatic Therapy;Cryotherapy;Programmer, systems;Therapeutic activities;Therapeutic exercise;Neuromuscular re-education;Manual techniques;Dry needling;Passive range of motion;Taping;Joint Manipulations    PT Next Visit Plan needs overall neck/lumbar/hip ROM, strength and ROM for goal of stairs    PT Home Exercise Plan Access Code: [email or web address]  Consulted and Agree with Plan of Care  Patient             Patient will benefit from skilled therapeutic intervention in order to improve the following deficits and impairments:  Decreased activity tolerance, Decreased balance, Decreased endurance, Decreased mobility, Decreased range of motion, Decreased strength, Difficulty walking, Hypomobility, Postural dysfunction, Other (comment), Pain  Visit Diagnosis: Cervicalgia  Chronic bilateral low back pain without sciatica  Other abnormalities of gait and mobility  Muscle weakness (generalized)  Abnormal posture     Problem List Patient Active Problem List   Diagnosis Date Noted   Agatston coronary artery calcium score greater than 400    Aortic aneurysm (HCC)    Aortic valve regurgitation 01/09/2019   Nephrolithiasis 01/09/2019   BPH (benign prostatic hyperplasia) 01/09/2019   Overweight (BMI 25.0-29.9) 01/09/2019   Primary osteoarthritis of both hips 12/29/2018   Primary osteoarthritis of both knees 12/29/2018   Raised intraocular pressure of both eyes 12/19/2018   Ankylosing spondylitis of multiple sites in spine ([place]) 12/19/2018   Hiatal hernia with GERD 11/08/2018   History of colonoscopy 01/07/2018   Gall stone in bile duct with infection of gallbladder 12/14/2017   Hernia of abdominal cavity 12/07/2016   Dislocated elbow, left, initial encounter 10/07/2016   Enlarged liver 12/08/2006   Aortic anomaly 01/07/2005   Fracture 02/17/2004   Keratoconus 12/07/1977    Debbe Odea, PT,DPT 06/23/2021, 11:16 AM  [facility] Physical Therapy [address] [place], [place], [address] Phone: [phone]   Fax:  [phone]  Name: Micha Dosanjh MRN: [id number] Date of Birth: 1951-06-13

## 2021-06-25 ENCOUNTER — Other Ambulatory Visit: Payer: Self-pay

## 2021-06-25 ENCOUNTER — Ambulatory Visit: Payer: Medicare Other | Admitting: Physical Therapy

## 2021-06-25 DIAGNOSIS — M6281 Muscle weakness (generalized): Secondary | ICD-10-CM

## 2021-06-25 DIAGNOSIS — M545 Low back pain, unspecified: Secondary | ICD-10-CM | POA: Diagnosis not present

## 2021-06-25 DIAGNOSIS — M542 Cervicalgia: Secondary | ICD-10-CM

## 2021-06-25 DIAGNOSIS — R293 Abnormal posture: Secondary | ICD-10-CM

## 2021-06-25 DIAGNOSIS — G8929 Other chronic pain: Secondary | ICD-10-CM

## 2021-06-25 DIAGNOSIS — R2689 Other abnormalities of gait and mobility: Secondary | ICD-10-CM | POA: Diagnosis not present

## 2021-06-25 NOTE — Therapy (Signed)
[facility] Physical Therapy [address] [place], [place], [address] Phone: [phone]   Fax:  [phone]  Physical Therapy Treatment  Patient Details  Name: Antonio Ayers MRN: [id number] Date of Birth: 02-02-1951 Referring Provider (PT): Ofilia Neas, PA-C   Encounter Date: 06/25/2021   PT End of Session - 06/25/21 1047     Visit Number 5    Number of Visits 15    Date for PT Re-Evaluation 08/10/21    Authorization Type UHC MCR    Progress Note Due on Visit 10    PT Start Time 4132    PT Stop Time 1100    PT Time Calculation (min) 45 min    Activity Tolerance Patient tolerated treatment well    Behavior During Therapy [facility] for tasks assessed/performed             Past Medical History:  Diagnosis Date   Abdominal hernia    Agatston coronary artery calcium score greater than 400    coronary Ca 454 on CT 2021   Ankylosing spondylitis ([place])    Aortic aneurysm (HCC)    mildly dilated ascending aorta at 47mm and aortic root 41mm by echo 03/2020   Aortic insufficiency    Chronic diarrhea    Colon polyp    Complication of anesthesia    GERD (gastroesophageal reflux disease)    Headache    Heart murmur    History of hiatal hernia    History of kidney stones    PONV (postoperative nausea and vomiting)     Past Surgical History:  Procedure Laterality Date   CATARACT EXTRACTION Left 2016   CHOLECYSTECTOMY  01/2018   COLONOSCOPY     02/2015   CORNEAL TRANSPLANT  1989, 1992, 2014   HEMORRHOID SURGERY N/A 03/20/2021   Procedure: HEMORRHOIDECTOMY WITH LIGATION AND HEMORRHOIDOPEXY;  Surgeon: Michael Boston, MD;  Location: [facility];  Service: General;  Laterality: N/A;   HERNIA REPAIR  2018   HIATAL HERNIA REPAIR     KIDNEY STONE SURGERY  03/2018   MASS EXCISION N/A 03/20/2021   Procedure: REMOVAL OF ANAL polyp x2;  Surgeon: Michael Boston, MD;  Location: [facility];  Service: General;  Laterality: N/A;   PROSTATE BIOPSY     x3   RECTAL EXAM UNDER  ANESTHESIA N/A 03/20/2021   Procedure: ANORECTAL EXAM UNDER ANESTHESIA;  Surgeon: Michael Boston, MD;  Location: [facility];  Service: General;  Laterality: N/A;   UPPER GASTROINTESTINAL ENDOSCOPY      There were no vitals filed for this visit.   Subjective Assessment - 06/25/21 1045     Subjective relays stiffness is his biggest complaint in his neck/back/hips, he has a little bit of pain to report in his Lt hip today.    Pertinent History PMH: OA,GERD,aortic aneurysm    Diagnostic tests XR    Patient Stated Goals reduce pain and stiffness    Pain Onset More than a month ago                [facility] PT Assessment - 06/25/21 0001       Assessment   Medical Diagnosis M45.0 (ICD-10-CM) - Ankylosing spondylitis of multiple sites in spine (HCC)and OA of both hips and knees    Referring Provider (PT) Ofilia Neas, PA-C      Other:   Other/ Comments 5 times sit to stand he must use UE support to push up 18 seconds      AROM   Cervical Flexion  35    Cervical Extension 3    Cervical - Right Side Bend 7    Cervical - Left Side Bend 5    Cervical - Right Rotation 35    Cervical - Left Rotation 30    Lumbar Flexion 50%    Lumbar Extension 50%    Lumbar - Right Side Bend 25%    Lumbar - Left Side Bend 50%    Lumbar - Right Rotation 50%    Lumbar - Left Rotation 50%               [facility] Adult PT Treatment/Exercise - 06/25/21 0001       Lumbar Exercises: Aerobic   Nustep L6 X 10 min LE/UE      Lumbar Exercises: Machines for Strengthening   Leg Press DL 81# seat #4 3X15, then 37# SL 2X15 ea side      Lumbar Exercises: Standing   Theraband Level (Row) Level 4 (Blue)    Row Limitations 2X15    Shoulder Extension Both;15 reps    Theraband Level (Shoulder Extension) Level 4 (Blue)    Shoulder Extension Limitations 2X15      Manual Therapy   Manual therapy comments bilat hip/knee flexion PROM,  manual hamstring stretching, bilateral long axis distraction, bilateral neck  sidebending and rotation PROM, neck extension and flexion PROM                       PT Short Term Goals - 06/25/21 1114       PT SHORT TERM GOAL #1   Title Pt will be I and compliant with intial HEP    Time 4    Period Weeks    Status Achieved    Target Date 06/29/21      PT SHORT TERM GOAL #2   Title Pt will improve neck ROM by 5 deg each plane    Time 4    Period Weeks    Status On-going               PT Long Term Goals - 06/25/21 1115       PT LONG TERM GOAL #1   Title Pt will report less than 4/10 overall pain with ADL's    Time 10    Period Weeks    Status On-going      PT LONG TERM GOAL #2   Title Pt will improve neck ROM by 10 deg each plane for improved scanning of his enviornment    Time 10    Status On-going      PT LONG TERM GOAL #3   Title Pt will report improved ability to navigate stairs and have less pain with this    Time 10    Period Weeks    Status On-going      PT LONG TERM GOAL #4   Title Pt will improve 5TSTS test to <20 seconds to show improved muscular endurance    Status On-going      PT LONG TERM GOAL #5   Title Pt will improve TUG time to 13 seconds with LRAD to show improved gait speed and balance    Time 10    Period Weeks    Status On-going                   Plan - 06/25/21 1047     Clinical Impression Statement updated measurements show significant improvement in 5 times sit to stand  test and lumbar ROM, he has made very small improvments in his overall neck ROM and continues to be limited by overall joint stiffness. Being that he has made progress I recommend he continue with more PT and he was encouraged to set up additional visits.    Personal Factors and Comorbidities Comorbidity 3+;Time since onset of injury/illness/exacerbation    Comorbidities JIR:CVELFYBOF spine, bilat hip/knee OA,GERD,aortic aneurysm    Examination-Activity Limitations Bend;Carry;Lift;Stand;Stairs;Squat;Locomotion Level;Reach  Overhead;Transfers    Examination-Participation Restrictions Cleaning;Community Activity;Driving    Stability/Clinical Decision Making Evolving/Moderate complexity    Rehab Potential Good    PT Frequency 2x / week    PT Duration Other (comment)   10 weeks   PT Treatment/Interventions Aquatic Therapy;Cryotherapy;Programmer, systems;Therapeutic activities;Therapeutic exercise;Neuromuscular re-education;Manual techniques;Dry needling;Passive range of motion;Taping;Joint Manipulations    PT Next Visit Plan needs overall neck/lumbar/hip ROM, strength and ROM for goal of stairs    PT Home Exercise Plan Access Code: [email or web address]    Consulted and Agree with Plan of Care Patient             Patient will benefit from skilled therapeutic intervention in order to improve the following deficits and impairments:  Decreased activity tolerance, Decreased balance, Decreased endurance, Decreased mobility, Decreased range of motion, Decreased strength, Difficulty walking, Hypomobility, Postural dysfunction, Other (comment), Pain  Visit Diagnosis: Cervicalgia  Chronic bilateral low back pain without sciatica  Other abnormalities of gait and mobility  Muscle weakness (generalized)  Abnormal posture     Problem List Patient Active Problem List   Diagnosis Date Noted   Agatston coronary artery calcium score greater than 400    Aortic aneurysm (HCC)    Aortic valve regurgitation 01/09/2019   Nephrolithiasis 01/09/2019   BPH (benign prostatic hyperplasia) 01/09/2019   Overweight (BMI 25.0-29.9) 01/09/2019   Primary osteoarthritis of both hips 12/29/2018   Primary osteoarthritis of both knees 12/29/2018   Raised intraocular pressure of both eyes 12/19/2018   Ankylosing spondylitis of multiple sites in spine ([place]) 12/19/2018   Hiatal hernia with GERD 11/08/2018   History of colonoscopy 01/07/2018   Gall stone in bile duct with infection of gallbladder  12/14/2017   Hernia of abdominal cavity 12/07/2016   Dislocated elbow, left, initial encounter 10/07/2016   Enlarged liver 12/08/2006   Aortic anomaly 01/07/2005   Fracture 02/17/2004   Keratoconus 12/07/1977    Debbe Odea, PT,DPT 06/25/2021, 11:17 AM  [facility] Physical Therapy [address] [place], [place], [address] Phone: [phone]   Fax:  [phone]  Name: Roemello Speyer MRN: [id number] Date of Birth: 1951/03/05

## 2021-07-01 ENCOUNTER — Telehealth: Payer: Self-pay | Admitting: Internal Medicine

## 2021-07-01 DIAGNOSIS — N400 Enlarged prostate without lower urinary tract symptoms: Secondary | ICD-10-CM

## 2021-07-01 DIAGNOSIS — Z0000 Encounter for general adult medical examination without abnormal findings: Secondary | ICD-10-CM

## 2021-07-01 DIAGNOSIS — R160 Hepatomegaly, not elsewhere classified: Secondary | ICD-10-CM

## 2021-07-01 NOTE — Telephone Encounter (Signed)
Patient called to see if lab work could be put in so he could come early to do labs. Patient states he will have a hard time fasting until 10:30. Will need a callback to schedule on lab schedule.    Good callback number is [phone]    Please advise

## 2021-07-06 DIAGNOSIS — R350 Frequency of micturition: Secondary | ICD-10-CM | POA: Diagnosis not present

## 2021-07-06 DIAGNOSIS — M62838 Other muscle spasm: Secondary | ICD-10-CM | POA: Diagnosis not present

## 2021-07-06 DIAGNOSIS — M6281 Muscle weakness (generalized): Secondary | ICD-10-CM | POA: Diagnosis not present

## 2021-07-06 DIAGNOSIS — M6289 Other specified disorders of muscle: Secondary | ICD-10-CM | POA: Diagnosis not present

## 2021-07-07 ENCOUNTER — Encounter: Payer: Self-pay | Admitting: Internal Medicine

## 2021-07-07 ENCOUNTER — Telehealth: Payer: Self-pay | Admitting: Internal Medicine

## 2021-07-07 NOTE — Telephone Encounter (Signed)
Pt call and stated he want to know can he come in for his labs tomorrow before his appt and want a call back to let him know

## 2021-07-07 NOTE — Telephone Encounter (Signed)
Left message on machine for patient to schedule a lab appointment Orders placed

## 2021-07-07 NOTE — Telephone Encounter (Signed)
Left message on machine for patient to schedule a lab appointment. Lab orders placed

## 2021-07-08 ENCOUNTER — Encounter: Payer: Self-pay | Admitting: Internal Medicine

## 2021-07-08 ENCOUNTER — Ambulatory Visit (INDEPENDENT_AMBULATORY_CARE_PROVIDER_SITE_OTHER): Payer: Medicare Other | Admitting: Internal Medicine

## 2021-07-08 ENCOUNTER — Other Ambulatory Visit (INDEPENDENT_AMBULATORY_CARE_PROVIDER_SITE_OTHER): Payer: Medicare Other

## 2021-07-08 VITALS — BP 130/80 | HR 75 | Temp 98.3°F | Ht 72.5 in | Wt 225.0 lb

## 2021-07-08 DIAGNOSIS — H409 Unspecified glaucoma: Secondary | ICD-10-CM

## 2021-07-08 DIAGNOSIS — Z0000 Encounter for general adult medical examination without abnormal findings: Secondary | ICD-10-CM

## 2021-07-08 DIAGNOSIS — I712 Thoracic aortic aneurysm, without rupture, unspecified: Secondary | ICD-10-CM | POA: Diagnosis not present

## 2021-07-08 DIAGNOSIS — I700 Atherosclerosis of aorta: Secondary | ICD-10-CM

## 2021-07-08 DIAGNOSIS — M450 Ankylosing spondylitis of multiple sites in spine: Secondary | ICD-10-CM

## 2021-07-08 DIAGNOSIS — N400 Enlarged prostate without lower urinary tract symptoms: Secondary | ICD-10-CM | POA: Diagnosis not present

## 2021-07-08 DIAGNOSIS — R160 Hepatomegaly, not elsewhere classified: Secondary | ICD-10-CM

## 2021-07-08 LAB — HEMOGLOBIN A1C: Hgb A1c MFr Bld: 5.2 % (ref 4.6–6.5)

## 2021-07-08 LAB — CBC WITH DIFFERENTIAL/PLATELET
Basophils Absolute: 0 10*3/uL (ref 0.0–0.1)
Basophils Relative: 0.7 % (ref 0.0–3.0)
Eosinophils Absolute: 0.1 10*3/uL (ref 0.0–0.7)
Eosinophils Relative: 1.3 % (ref 0.0–5.0)
HCT: 44.1 % (ref 39.0–52.0)
Hemoglobin: 15.3 g/dL (ref 13.0–17.0)
Lymphocytes Relative: 16.7 % (ref 12.0–46.0)
Lymphs Abs: 1.1 10*3/uL (ref 0.7–4.0)
MCHC: 34.7 g/dL (ref 30.0–36.0)
MCV: 92.4 fl (ref 78.0–100.0)
Monocytes Absolute: 0.7 10*3/uL (ref 0.1–1.0)
Monocytes Relative: 10.3 % (ref 3.0–12.0)
Neutro Abs: 4.7 10*3/uL (ref 1.4–7.7)
Neutrophils Relative %: 71 % (ref 43.0–77.0)
Platelets: 170 10*3/uL (ref 150.0–400.0)
RBC: 4.77 Mil/uL (ref 4.22–5.81)
RDW: 12.8 % (ref 11.5–15.5)
WBC: 6.6 10*3/uL (ref 4.0–10.5)

## 2021-07-08 LAB — LIPID PANEL
Cholesterol: 106 mg/dL (ref 0–200)
HDL: 56.7 mg/dL (ref >39.00)
LDL Cholesterol: 36 mg/dL (ref 0–99)
NonHDL: 48.9
Total CHOL/HDL Ratio: 2
Triglycerides: 64 mg/dL (ref 0.0–149.0)
VLDL: 12.8 mg/dL (ref 0.0–40.0)

## 2021-07-08 LAB — TSH: TSH: 1.37 u[IU]/mL (ref 0.35–5.50)

## 2021-07-08 LAB — VITAMIN B12: Vitamin B-12: 249 pg/mL (ref 211–911)

## 2021-07-08 LAB — VITAMIN D 25 HYDROXY (VIT D DEFICIENCY, FRACTURES): VITD: 40.45 ng/mL (ref 30.00–100.00)

## 2021-07-08 NOTE — Progress Notes (Signed)
Established Patient Office Visit     This visit occurred during the SARS-CoV-2 public health emergency.  Safety protocols were in place, including screening questions prior to the visit, additional usage of staff PPE, and extensive cleaning of exam room while observing appropriate contact time as indicated for disinfecting solutions.    CC/Reason for Visit: Annual preventive exam and subsequent Medicare wellness visit  HPI: Antonio Ayers is a 70 y.o. male who is coming in today for the above mentioned reasons. Past Medical History is significant for: Ankylosing spondylitis followed by rheumatology currently on Humira every other week, BPH followed by urology, history of glaucoma and some deteriorating vision followed by ophthalmology, he has had bilateral corneal transplants, he also has a history of a thoracic aortic aneurysm followed by cardiology with yearly CT scans.  His last one was in 2020.  He has had persistently elevated intraocular pressure that has been leading to some headaches and dizziness.  His ophthalmologist is attempting medication changes.  All immunizations are up-to-date, colonoscopy is up-to-date.   Past Medical/Surgical History: Past Medical History:  Diagnosis Date   Abdominal hernia    Agatston coronary artery calcium score greater than 400    coronary Ca 454 on CT 2021   Ankylosing spondylitis (HCC)    Aortic aneurysm (HCC)    mildly dilated ascending aorta at 83mm and aortic root 36mm by echo 03/2020   Aortic insufficiency    Chronic diarrhea    Colon polyp    Complication of anesthesia    GERD (gastroesophageal reflux disease)    Headache    Heart murmur    History of hiatal hernia    History of kidney stones    PONV (postoperative nausea and vomiting)     Past Surgical History:  Procedure Laterality Date   CATARACT EXTRACTION Left 2016   CHOLECYSTECTOMY  01/2018   COLONOSCOPY     02/2015   CORNEAL TRANSPLANT  1989, 1992, 2014    HEMORRHOID SURGERY N/A 03/20/2021   Procedure: HEMORRHOIDECTOMY WITH LIGATION AND HEMORRHOIDOPEXY;  Surgeon: Michael Boston, MD;  Location: [facility];  Service: General;  Laterality: N/A;   HERNIA REPAIR  2018   HIATAL HERNIA REPAIR     KIDNEY STONE SURGERY  03/2018   MASS EXCISION N/A 03/20/2021   Procedure: REMOVAL OF ANAL polyp x2;  Surgeon: Michael Boston, MD;  [facility]: [facility];  Service: General;  Laterality: N/A;   PROSTATE BIOPSY     x3   RECTAL EXAM UNDER ANESTHESIA N/A 03/20/2021   Procedure: ANORECTAL EXAM UNDER ANESTHESIA;  Surgeon: Michael Boston, MD;  Location: [facility];  Service: General;  Laterality: N/A;   UPPER GASTROINTESTINAL ENDOSCOPY      Social History:  reports that he has never smoked. He has never used smokeless tobacco. He reports current alcohol use. He reports that he does not use drugs.  Allergies: No Known Allergies  Family History:  Family History  Problem Relation Age of Onset   Stroke Mother    Stroke Father    Heart failure Father    Diabetes Sister    Obesity Sister    Hiatal hernia Sister    Colon cancer Neg Hx    Esophageal cancer Neg Hx    Rectal cancer Neg Hx    Stomach cancer Neg Hx      Current Outpatient Medications:    acetaminophen (TYLENOL) 325 MG tablet, Take 325 mg by mouth every 6 (six) hours as needed for mild pain  or moderate pain., Disp: , Rfl:    Adalimumab (HUMIRA PEN) 40 MG/0.4ML PNKT, Inject 40 mg into the skin every 21 ( twenty-one) days., Disp: 4 each, Rfl: 0   atorvastatin (LIPITOR) 20 MG tablet, Take 1 tablet (20 mg total) by mouth daily., Disp: 90 tablet, Rfl: 3   brimonidine-timolol (COMBIGAN) 0.2-0.5 % ophthalmic solution, Place 1 drop into both eyes 2 (two) times daily., Disp: , Rfl:    Cholecalciferol 25 MCG (1000 UT) capsule, Take 1,000 Units by mouth daily. , Disp: , Rfl:    latanoprost (XALATAN) 0.005 % ophthalmic solution, Place 1 drop into both eyes at bedtime., Disp: , Rfl:    LOTEMAX 0.5 % GEL, Place 1 drop into  the left eye daily., Disp: , Rfl:    polyethylene glycol (MIRALAX / GLYCOLAX) 17 g packet, Take 17 g by mouth daily as needed (Constipation)., Disp: , Rfl:    potassium citrate (UROCIT-K) 10 MEQ (1080 MG) SR tablet, Take 10 mEq by mouth 3 (three) times daily., Disp: , Rfl:    psyllium (METAMUCIL SMOOTH TEXTURE) 28 % packet, Take 1 packet by mouth 2 (two) times daily., Disp: , Rfl:   Review of Systems:  Constitutional: Denies fever, chills, diaphoresis, appetite change and fatigue.  HEENT: Denies photophobia, eye pain, redness, hearing loss, ear pain, congestion, sore throat, rhinorrhea, sneezing, mouth sores, trouble swallowing, neck pain, neck stiffness and tinnitus.   Respiratory: Denies SOB, DOE, cough, chest tightness,  and wheezing.   Cardiovascular: Denies chest pain, palpitations and leg swelling.  Gastrointestinal: Denies nausea, vomiting, abdominal pain, diarrhea, constipation, blood in stool and abdominal distention.  Genitourinary: Denies dysuria, urgency, frequency, hematuria, flank pain and difficulty urinating.  Endocrine: Denies: hot or cold intolerance, sweats, changes in hair or nails, polyuria, polydipsia. Musculoskeletal: Denies myalgias, back pain, joint swelling, arthralgias and gait problem.  Skin: Denies pallor, rash and wound.  Neurological: Denies seizures, syncope, weakness, light-headedness, numbness. Hematological: Denies adenopathy. Easy bruising, personal or family bleeding history  Psychiatric/Behavioral: Denies suicidal ideation, mood changes, confusion, nervousness, sleep disturbance and agitation    Physical Exam: Vitals:   07/08/21 1040  BP: 130/80  Pulse: 75  Temp: 98.3 F (36.8 C)  TempSrc: Oral  SpO2: 96%  Weight: 225 lb (102.1 kg)  Height: 6' 0.5" (1.842 m)    Body mass index is 30.1 kg/m.   Constitutional: NAD, calm, comfortable Eyes: PERRL, lids and conjunctivae normal, wears corrective lenses ENMT: Mucous membranes are moist. Posterior  pharynx clear of any exudate or lesions. Normal dentition. Tympanic membrane is pearly white, no erythema or bulging. Neck: normal, supple, no masses, no thyromegaly Respiratory: clear to auscultation bilaterally, no wheezing, no crackles. Normal respiratory effort. No accessory muscle use.  Cardiovascular: Regular rate and rhythm, no murmurs / rubs / gallops. No extremity edema. 2+ pedal pulses. No carotid bruits.  Abdomen: no tenderness, no masses palpated. No hepatosplenomegaly. Bowel sounds positive.  Musculoskeletal: no clubbing / cyanosis. No joint deformity upper and lower extremities. Good ROM, no contractures. Normal muscle tone.  Skin: no rashes, lesions, ulcers. No induration Neurologic: CN 2-12 grossly intact. Sensation intact, DTR normal. Strength 5/5 in all 4.  Psychiatric: Normal judgment and insight. Alert and oriented x 3. Normal mood.    Subsequent Medicare wellness visit   1. Risk factors, based on past  M,S,F -cardiovascular disease risk factors include age, gender, history of aortic atherosclerosis   2.  Physical activities: Just activities of daily living is otherwise sedentary   3.  Depression/mood: Stable, not depressed   4.  Hearing: No perceived issues   5.  ADL's: Independent in all ADLs   6.  Fall risk: Moderate to high fall risk due to previous history of falls   7.  Home safety: No problems identified   8.  Height weight, and visual acuity: height and weight as above, vision:  Vision Screening   Right eye Left eye Both eyes  Without correction     With correction 20/50 na na  Comments: Dr Valetta Close 04/2021   9.  Counseling: Advised to follow-up with cardiology in regards to his thoracic aortic aneurysm   10. Lab orders based on risk factors: Laboratory update will be reviewed   11. Referral : None today   12. Care plan: Follow-up with me in 6 months   13. Cognitive assessment: No cognitive impairment   14. Screening: Patient provided with a written  and personalized 5-10 year screening schedule in the AVS. yes   15. Provider List Update: PCP, cardiologist Dr. Radford Pax, ophthalmologist Dr. Franne Grip  16. Advance Directives: Full code   17. Opioids: Patient is not on any opioid prescriptions and has no risk factors for a substance use disorder.   [place] Office Visit from 07/08/2021 in [place] [facility] [place]  PHQ-9 Total Score 2       Fall Risk 06/24/2020 09/03/2020 01/26/2021 03/20/2021 07/08/2021  Falls in the past year? 1 - - - 1  Was there an injury with Fall? 1 - - - 0  Fall Risk Category Calculator 2 - - - 2  Fall Risk Category Moderate - - - Moderate  Patient Fall Risk Level - Low fall risk High fall risk High fall risk -     Impression and Plan:  Encounter for preventive health examination -Recommend routine eye and dental care. -Immunizations: All immunizations are up-to-date and age-appropriate -Healthy lifestyle discussed in detail. -Labs to be updated today. -Colon cancer screening: March/2022 -Breast cancer screening: Not applicable -Cervical cancer screening: Not applicable -Lung cancer screening: Not applicable -Prostate cancer screening: PSA today -DEXA: Not applicable  Benign prostatic hyperplasia without lower urinary tract symptoms -PSA ordered.  Thoracic aortic aneurysm without rupture, unspecified part -Is overdue for imaging, advised to contact Dr. Theodosia Blender office.  Ankylosing spondylitis of multiple sites in spine ([facility]) -Followed by rheumatology on Humira.  Aortic atherosclerosis (HCC) -Check lipids today, on atorvastatin 20 mg daily.  Glaucoma of both eyes, unspecified glaucoma type -Followed by ophthalmology    Patient Instructions  -Nice seeing you today!!  -Lab work today; will notify you once results are available.  -Make sure you schedule follow up with Dr. Radford Pax in regards to your aortic aneurysm. It is time to schedule repeat imaging.  -Schedule follow up in 6  months or sooner as needed.     Lelon Frohlich, MD [facility]

## 2021-07-08 NOTE — Patient Instructions (Addendum)
-  Nice seeing you today!!  -Lab work today; will notify you once results are available.  -Make sure you schedule follow up with Dr. Radford Pax in regards to your aortic aneurysm. It is time to schedule repeat imaging.  -Schedule follow up in 6 months or sooner as needed.

## 2021-07-09 ENCOUNTER — Other Ambulatory Visit: Payer: Self-pay | Admitting: *Deleted

## 2021-07-09 DIAGNOSIS — M450 Ankylosing spondylitis of multiple sites in spine: Secondary | ICD-10-CM

## 2021-07-09 LAB — COMPLETE METABOLIC PANEL WITH GFR
AG Ratio: 1.8 (calc) (ref 1.0–2.5)
ALT: 19 U/L (ref 9–46)
AST: 21 U/L (ref 10–35)
Albumin: 4.3 g/dL (ref 3.6–5.1)
Alkaline phosphatase (APISO): 93 U/L (ref 35–144)
BUN/Creatinine Ratio: 18 (calc) (ref 6–22)
BUN: 12 mg/dL (ref 7–25)
CO2: 26 mmol/L (ref 20–32)
Calcium: 9 mg/dL (ref 8.6–10.3)
Chloride: 104 mmol/L (ref 98–110)
Creat: 0.67 mg/dL — ABNORMAL LOW (ref 0.70–1.28)
Globulin: 2.4 g/dL (calc) (ref 1.9–3.7)
Glucose, Bld: 94 mg/dL (ref 65–99)
Potassium: 4.4 mmol/L (ref 3.5–5.3)
Sodium: 139 mmol/L (ref 135–146)
Total Bilirubin: 3.3 mg/dL — ABNORMAL HIGH (ref 0.2–1.2)
Total Protein: 6.7 g/dL (ref 6.1–8.1)
eGFR: 100 mL/min/{1.73_m2} (ref >=60)

## 2021-07-09 MED ORDER — HUMIRA (2 PEN) 40 MG/0.4ML ~~LOC~~ AJKT: 40.0000 mg | AUTO-INJECTOR | SUBCUTANEOUS | 0 refills | Status: DC

## 2021-07-09 NOTE — Telephone Encounter (Signed)
Refill request received via fax  Next Visit: 10/22/2021  Last Visit: 05/21/2021  Last Fill: 04/27/2021  DX:Ankylosing spondylitis of multiple sites in spine   Current Dose per office note 05/21/2021: Humira 40 mg sq injections every 21 days  Labs: 07/08/2021 Creat. 0.67, Total Bilirubin 3.3  TB Gold: 12/18/2020 Neg    Okay to refill Humira?

## 2021-07-14 ENCOUNTER — Other Ambulatory Visit: Payer: Self-pay

## 2021-07-14 ENCOUNTER — Ambulatory Visit: Payer: Medicare Other | Admitting: Physical Therapy

## 2021-07-14 DIAGNOSIS — M6281 Muscle weakness (generalized): Secondary | ICD-10-CM | POA: Diagnosis not present

## 2021-07-14 DIAGNOSIS — M545 Low back pain, unspecified: Secondary | ICD-10-CM

## 2021-07-14 DIAGNOSIS — M542 Cervicalgia: Secondary | ICD-10-CM

## 2021-07-14 DIAGNOSIS — R2689 Other abnormalities of gait and mobility: Secondary | ICD-10-CM | POA: Diagnosis not present

## 2021-07-14 DIAGNOSIS — G8929 Other chronic pain: Secondary | ICD-10-CM

## 2021-07-14 DIAGNOSIS — R293 Abnormal posture: Secondary | ICD-10-CM | POA: Diagnosis not present

## 2021-07-14 NOTE — Therapy (Signed)
[facility] Physical Therapy [address] [place], [place], [address] Phone: [phone]   Fax:  [phone]  Physical Therapy Treatment  Patient Details  Name: Antonio Ayers MRN: [id number] Date of Birth: [birth year] Referring Provider (PT): Ofilia Neas, PA-C   Encounter Date: 07/14/2021   PT End of Session - 07/14/21 1133     Visit Number 6    Number of Visits 15    Date for PT Re-Evaluation 08/10/21    Authorization Type UHC MCR    Progress Note Due on Visit 10    PT Start Time 7616    PT Stop Time 1100    PT Time Calculation (min) 45 min    Activity Tolerance Patient tolerated treatment well    Behavior During Therapy [facility] for tasks assessed/performed             Past Medical History:  Diagnosis Date   Abdominal hernia    Agatston coronary artery calcium score greater than 400    coronary Ca 454 on CT 2021   Ankylosing spondylitis ([place])    Aortic aneurysm (HCC)    mildly dilated ascending aorta at 16mm and aortic root 31mm by echo 03/2020   Aortic insufficiency    Chronic diarrhea    Colon polyp    Complication of anesthesia    GERD (gastroesophageal reflux disease)    Headache    Heart murmur    History of hiatal hernia    History of kidney stones    PONV (postoperative nausea and vomiting)     Past Surgical History:  Procedure Laterality Date   CATARACT EXTRACTION Left 2016   CHOLECYSTECTOMY  01/2018   COLONOSCOPY     02/2015   CORNEAL TRANSPLANT  1989, 1992, 2014   HEMORRHOID SURGERY N/A 03/20/2021   Procedure: HEMORRHOIDECTOMY WITH LIGATION AND HEMORRHOIDOPEXY;  Surgeon: Michael Boston, MD;  Location: [facility];  Service: General;  Laterality: N/A;   HERNIA REPAIR  2018   HIATAL HERNIA REPAIR     KIDNEY STONE SURGERY  03/2018   MASS EXCISION N/A 03/20/2021   Procedure: REMOVAL OF ANAL polyp x2;  Surgeon: Michael Boston, MD;  Location: [facility];  Service: General;  Laterality: N/A;   PROSTATE BIOPSY     x3   RECTAL EXAM UNDER  ANESTHESIA N/A 03/20/2021   Procedure: ANORECTAL EXAM UNDER ANESTHESIA;  Surgeon: Michael Boston, MD;  Location: [facility];  Service: General;  Laterality: N/A;   UPPER GASTROINTESTINAL ENDOSCOPY      There were no vitals filed for this visit.   Subjective Assessment - 07/14/21 1131     Subjective relays stiffness continues to be is his biggest complaint in his neck/back/hips, he has a little bit of pain to report in his Lt hip today but not much. He has also been having some dizziness but saw MD about this who will adjust medications some    Pertinent History PMH: OA,GERD,aortic aneurysm    Diagnostic tests XR    Patient Stated Goals reduce pain and stiffness    Pain Onset More than a month ago             [facility] Adult PT Treatment/Exercise - 07/14/21 0001       Neck Exercises: Theraband   Shoulder Extension 20 reps;Blue    Rows 20 reps;Blue      Lumbar Exercises: Stretches   Other Lumbar Stretch Exercise standing hip flexor stretch with foot on 4 inch step (tried 6 inch step but too tall  for him) 3X20 seconds bilat      Lumbar Exercises: Standing   Other Standing Lumbar Exercises squats with UE support at sink with chair touch to chair with airex pad in it.    Other Standing Lumbar Exercises lateral walking 3 round trips no UE support      Manual Therapy   Manual therapy comments bilat hip/knee flexion PROM, hip abd PROM manual hamstring stretching, bilateral long axis distraction, bilateral neck sidebending and rotation PROM, neck extension PROM                       PT Short Term Goals - 06/25/21 1114       PT SHORT TERM GOAL #1   Title Pt will be I and compliant with intial HEP    Time 4    Period Weeks    Status Achieved    Target Date 06/29/21      PT SHORT TERM GOAL #2   Title Pt will improve neck ROM by 5 deg each plane    Time 4    Period Weeks    Status On-going               PT Long Term Goals - 06/25/21 1115       PT LONG TERM GOAL  #1   Title Pt will report less than 4/10 overall pain with ADL's    Time 10    Period Weeks    Status On-going      PT LONG TERM GOAL #2   Title Pt will improve neck ROM by 10 deg each plane for improved scanning of his enviornment    Time 10    Status On-going      PT LONG TERM GOAL #3   Title Pt will report improved ability to navigate stairs and have less pain with this    Time 10    Period Weeks    Status On-going      PT LONG TERM GOAL #4   Title Pt will improve 5TSTS test to <20 seconds to show improved muscular endurance    Status On-going      PT LONG TERM GOAL #5   Title Pt will improve TUG time to 13 seconds with LRAD to show improved gait speed and balance    Time 10    Period Weeks    Status On-going                   Plan - 07/14/21 1154     Clinical Impression Statement We continued to work to improve his overall stiffness and ROM. He continues to struggle with this but he is doing well from a strength standpoint. We also added balance activity of sidestepping without UE support and he would benefit from further balance training as well. Continue POC    Personal Factors and Comorbidities Comorbidity 3+;Time since onset of injury/illness/exacerbation    Comorbidities OMB:[id number] spine, bilat hip/knee OA,GERD,aortic aneurysm    Examination-Activity Limitations Bend;Carry;Lift;Stand;Stairs;Squat;Locomotion Level;Reach Overhead;Transfers    Examination-Participation Restrictions Cleaning;Community Activity;Driving    Stability/Clinical Decision Making Evolving/Moderate complexity    Rehab Potential Good    PT Frequency 2x / week    PT Duration Other (comment)   10 weeks   PT Treatment/Interventions Aquatic Therapy;Cryotherapy;Programmer, systems;Therapeutic activities;Therapeutic exercise;Neuromuscular re-education;Manual techniques;Dry needling;Passive range of motion;Taping;Joint Manipulations    PT Next  Visit Plan needs overall neck/lumbar/hip ROM, strength and ROM for goal of stairs  PT Home Exercise Plan Access Code: [email or web address]    Consulted and Agree with Plan of Care Patient             Patient will benefit from skilled therapeutic intervention in order to improve the following deficits and impairments:  Decreased activity tolerance, Decreased balance, Decreased endurance, Decreased mobility, Decreased range of motion, Decreased strength, Difficulty walking, Hypomobility, Postural dysfunction, Other (comment), Pain  Visit Diagnosis: Cervicalgia  Chronic bilateral low back pain without sciatica  Other abnormalities of gait and mobility  Muscle weakness (generalized)  Abnormal posture     Problem List Patient Active Problem List   Diagnosis Date Noted   Agatston coronary artery calcium score greater than 400    Aortic aneurysm (HCC)    Aortic valve regurgitation 01/09/2019   Nephrolithiasis 01/09/2019   BPH (benign prostatic hyperplasia) 01/09/2019   Overweight (BMI 25.0-29.9) 01/09/2019   Primary osteoarthritis of both hips 12/29/2018   Primary osteoarthritis of both knees 12/29/2018   Raised intraocular pressure of both eyes 12/19/2018   Ankylosing spondylitis of multiple sites in spine ([place]) 12/19/2018   Hiatal hernia with GERD 11/08/2018   History of colonoscopy 01/07/2018   Gall stone in bile duct with infection of gallbladder 12/14/2017   Hernia of abdominal cavity 12/07/2016   Dislocated elbow, left, initial encounter 10/07/2016   Enlarged liver 12/08/2006   Aortic anomaly 01/07/2005   Fracture 02/17/2004   Keratoconus 12/07/1977    Debbe Odea, PT,DPT 07/14/2021, 11:59 AM  [facility] Physical Therapy [address] [place], [place], [address] Phone: [phone]   Fax:  [phone]  Name: Antonio Ayers MRN: [id number] Date of Birth: [birth year]

## 2021-07-16 ENCOUNTER — Ambulatory Visit: Payer: Medicare Other | Admitting: Physical Therapy

## 2021-07-16 ENCOUNTER — Other Ambulatory Visit: Payer: Self-pay

## 2021-07-16 DIAGNOSIS — M6281 Muscle weakness (generalized): Secondary | ICD-10-CM | POA: Diagnosis not present

## 2021-07-16 DIAGNOSIS — R2689 Other abnormalities of gait and mobility: Secondary | ICD-10-CM

## 2021-07-16 DIAGNOSIS — R293 Abnormal posture: Secondary | ICD-10-CM | POA: Diagnosis not present

## 2021-07-16 DIAGNOSIS — M542 Cervicalgia: Secondary | ICD-10-CM | POA: Diagnosis not present

## 2021-07-16 DIAGNOSIS — M545 Low back pain, unspecified: Secondary | ICD-10-CM

## 2021-07-16 DIAGNOSIS — G8929 Other chronic pain: Secondary | ICD-10-CM

## 2021-07-16 NOTE — Therapy (Signed)
[facility] Physical Therapy [address] [place], [place], [address] Phone: [phone]   Fax:  [phone]  Physical Therapy Treatment  Patient Details  Name: Antonio Ayers MRN: [id number] Date of Birth: 1951-05-05 Referring Provider (PT): Ofilia Neas, PA-C   Encounter Date: 07/16/2021   PT End of Session - 07/16/21 1218     Visit Number 7    Number of Visits 15    Date for PT Re-Evaluation 08/10/21    Authorization Type UHC MCR    Progress Note Due on Visit 10    PT Start Time 1105    PT Stop Time 1150    PT Time Calculation (min) 45 min    Activity Tolerance Patient tolerated treatment well    Behavior During Therapy [facility] for tasks assessed/performed             Past Medical History:  Diagnosis Date   Abdominal hernia    Agatston coronary artery calcium score greater than 400    coronary Ca 454 on CT 2021   Ankylosing spondylitis ([place])    Aortic aneurysm (HCC)    mildly dilated ascending aorta at 64mm and aortic root 3mm by echo 03/2020   Aortic insufficiency    Chronic diarrhea    Colon polyp    Complication of anesthesia    GERD (gastroesophageal reflux disease)    Headache    Heart murmur    History of hiatal hernia    History of kidney stones    PONV (postoperative nausea and vomiting)     Past Surgical History:  Procedure Laterality Date   CATARACT EXTRACTION Left 2016   CHOLECYSTECTOMY  01/2018   COLONOSCOPY     02/2015   CORNEAL TRANSPLANT  1989, 1992, 2014   HEMORRHOID SURGERY N/A 03/20/2021   Procedure: HEMORRHOIDECTOMY WITH LIGATION AND HEMORRHOIDOPEXY;  Surgeon: Michael Boston, MD;  Location: [facility];  Service: General;  Laterality: N/A;   HERNIA REPAIR  2018   HIATAL HERNIA REPAIR     KIDNEY STONE SURGERY  03/2018   MASS EXCISION N/A 03/20/2021   Procedure: REMOVAL OF ANAL polyp x2;  Surgeon: Michael Boston, MD;  Location: [facility];  Service: General;  Laterality: N/A;   PROSTATE BIOPSY     x3   RECTAL EXAM UNDER  ANESTHESIA N/A 03/20/2021   Procedure: ANORECTAL EXAM UNDER ANESTHESIA;  Surgeon: Michael Boston, MD;  Location: [facility];  Service: General;  Laterality: N/A;   UPPER GASTROINTESTINAL ENDOSCOPY      There were no vitals filed for this visit.   Subjective Assessment - 07/16/21 1200     Subjective relays some moderate pain that feels like pins and needles in his left knee today but other than that no other pain. He does not feel too sore after last session    Pertinent History PMH: OA,GERD,aortic aneurysm    Diagnostic tests XR    Patient Stated Goals reduce pain and stiffness    Pain Onset More than a month ago                               [facility] Adult PT Treatment/Exercise - 07/16/21 0001       Neck Exercises: Theraband   Shoulder Extension 20 reps;Blue    Rows 20 reps;Blue      Lumbar Exercises: Stretches   Other Lumbar Stretch Exercise standing hip flexor stretch 10 sec X 5 bilat  Lumbar Exercises: Aerobic   Nustep L6 X 10 min LE/UE      Lumbar Exercises: Machines for Strengthening   Leg Press DL 100# 3X15 into as much hip/knee flexion as tolerated      Lumbar Exercises: Standing   Other Standing Lumbar Exercises squats with UE support at sink with chair touch to chair with airex pad in it 2X10    Other Standing Lumbar Exercises lateral walking and retro walking 3 round trips no UE support. Balance on foam pad with feet close 1 min, then head turns X10 and head nods X10      Manual Therapy   Manual therapy comments bilat hip/knee flexion PROM, hip abd PROM manual hamstring stretching, bilateral long axis distraction, bilateral neck sidebending and rotation PROM, neck extension PROM                       PT Short Term Goals - 06/25/21 1114       PT SHORT TERM GOAL #1   Title Pt will be I and compliant with intial HEP    Time 4    Period Weeks    Status Achieved    Target Date 06/29/21      PT SHORT TERM GOAL #2   Title Pt will  improve neck ROM by 5 deg each plane    Time 4    Period Weeks    Status On-going               PT Long Term Goals - 06/25/21 1115       PT LONG TERM GOAL #1   Title Pt will report less than 4/10 overall pain with ADL's    Time 10    Period Weeks    Status On-going      PT LONG TERM GOAL #2   Title Pt will improve neck ROM by 10 deg each plane for improved scanning of his enviornment    Time 10    Status On-going      PT LONG TERM GOAL #3   Title Pt will report improved ability to navigate stairs and have less pain with this    Time 10    Period Weeks    Status On-going      PT LONG TERM GOAL #4   Title Pt will improve 5TSTS test to <20 seconds to show improved muscular endurance    Status On-going      PT LONG TERM GOAL #5   Title Pt will improve TUG time to 13 seconds with LRAD to show improved gait speed and balance    Time 10    Period Weeks    Status On-going                   Plan - 07/16/21 1218     Clinical Impression Statement We added more balance challenges today to work on his gait unsteadiness without his SPC. We continued to work to improve ROM and strength with good pain tolerance but severe ROM restrictions limit this some. Continue POC with efforts to maximize his overall function and balance.    Personal Factors and Comorbidities Comorbidity 3+;Time since onset of injury/illness/exacerbation    Comorbidities JEH:UDJSHFWYO spine, bilat hip/knee OA,GERD,aortic aneurysm    Examination-Activity Limitations Bend;Carry;Lift;Stand;Stairs;Squat;Locomotion Level;Reach Overhead;Transfers    Examination-Participation Restrictions Cleaning;Community Activity;Driving    Stability/Clinical Decision Making Evolving/Moderate complexity    Rehab Potential Good    PT Frequency 2x / week  PT Duration Other (comment)   10 weeks   PT Treatment/Interventions Aquatic Therapy;Cryotherapy;Administrator, arts;Therapeutic activities;Therapeutic exercise;Neuromuscular re-education;Manual techniques;Dry needling;Passive range of motion;Taping;Joint Manipulations    PT Next Visit Plan needs overall neck/lumbar/hip ROM, strength and ROM for goal of stairs, balance    PT Home Exercise Plan Access Code: [email or web address]    Consulted and Agree with Plan of Care Patient             Patient will benefit from skilled therapeutic intervention in order to improve the following deficits and impairments:  Decreased activity tolerance, Decreased balance, Decreased endurance, Decreased mobility, Decreased range of motion, Decreased strength, Difficulty walking, Hypomobility, Postural dysfunction, Other (comment), Pain  Visit Diagnosis: Cervicalgia  Chronic bilateral low back pain without sciatica  Other abnormalities of gait and mobility  Muscle weakness (generalized)  Abnormal posture     Problem List Patient Active Problem List   Diagnosis Date Noted   Agatston coronary artery calcium score greater than 400    Aortic aneurysm (HCC)    Aortic valve regurgitation 01/09/2019   Nephrolithiasis 01/09/2019   BPH (benign prostatic hyperplasia) 01/09/2019   Overweight (BMI 25.0-29.9) 01/09/2019   Primary osteoarthritis of both hips 12/29/2018   Primary osteoarthritis of both knees 12/29/2018   Raised intraocular pressure of both eyes 12/19/2018   Ankylosing spondylitis of multiple sites in spine ([place]) 12/19/2018   Hiatal hernia with GERD 11/08/2018   History of colonoscopy 01/07/2018   Gall stone in bile duct with infection of gallbladder 12/14/2017   Hernia of abdominal cavity 12/07/2016   Dislocated elbow, left, initial encounter 10/07/2016   Enlarged liver 12/08/2006   Aortic anomaly 01/07/2005   Fracture 02/17/2004   Keratoconus 12/07/1977    Debbe Odea, PT,DPT 07/16/2021, 12:20 PM  [facility] Physical Therapy [address] [place], [place], [address] Phone:  [phone]   Fax:  [phone]  Name: Taylin Leder MRN: [id number] Date of Birth: 24-Sep-1950

## 2021-07-21 ENCOUNTER — Encounter: Payer: Medicare Other | Admitting: Physical Therapy

## 2021-07-23 ENCOUNTER — Encounter: Payer: Self-pay | Admitting: Sports Medicine

## 2021-07-23 ENCOUNTER — Ambulatory Visit: Payer: Medicare Other | Admitting: Physical Therapy

## 2021-07-23 ENCOUNTER — Other Ambulatory Visit: Payer: Self-pay

## 2021-07-23 ENCOUNTER — Ambulatory Visit: Payer: Medicare Other | Admitting: Sports Medicine

## 2021-07-23 ENCOUNTER — Encounter: Payer: Self-pay | Admitting: Physical Therapy

## 2021-07-23 DIAGNOSIS — M545 Low back pain, unspecified: Secondary | ICD-10-CM

## 2021-07-23 DIAGNOSIS — M79674 Pain in right toe(s): Secondary | ICD-10-CM

## 2021-07-23 DIAGNOSIS — M542 Cervicalgia: Secondary | ICD-10-CM

## 2021-07-23 DIAGNOSIS — G8929 Other chronic pain: Secondary | ICD-10-CM

## 2021-07-23 DIAGNOSIS — R2689 Other abnormalities of gait and mobility: Secondary | ICD-10-CM

## 2021-07-23 DIAGNOSIS — R293 Abnormal posture: Secondary | ICD-10-CM | POA: Diagnosis not present

## 2021-07-23 DIAGNOSIS — M79675 Pain in left toe(s): Secondary | ICD-10-CM | POA: Diagnosis not present

## 2021-07-23 DIAGNOSIS — M6281 Muscle weakness (generalized): Secondary | ICD-10-CM | POA: Diagnosis not present

## 2021-07-23 DIAGNOSIS — B351 Tinea unguium: Secondary | ICD-10-CM

## 2021-07-23 NOTE — Progress Notes (Signed)
Subjective: Antonio Ayers is a 70 y.o. male patient seen today in office with complaint of mildly painful thickened and elongated toenails; unable to trim. Has a history of Anklyosis spondylitis on Humera unchanged from prior. No other issues noted.  Patient Active Problem List   Diagnosis Date Noted   Agatston coronary artery calcium score greater than 400    Aortic aneurysm (HCC)    Aortic valve regurgitation 01/09/2019   Nephrolithiasis 01/09/2019   BPH (benign prostatic hyperplasia) 01/09/2019   Overweight (BMI 25.0-29.9) 01/09/2019   Primary osteoarthritis of both hips 12/29/2018   Primary osteoarthritis of both knees 12/29/2018   Raised intraocular pressure of both eyes 12/19/2018   Ankylosing spondylitis of multiple sites in spine ([place]) 12/19/2018   Hiatal hernia with GERD 11/08/2018   History of colonoscopy 01/07/2018   Gall stone in bile duct with infection of gallbladder 12/14/2017   Hernia of abdominal cavity 12/07/2016   Dislocated elbow, left, initial encounter 10/07/2016   Enlarged liver 12/08/2006   Aortic anomaly 01/07/2005   Fracture 02/17/2004   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     Adalimumab (HUMIRA PEN) 40 MG/0.4ML PNKT Inject 40 mg into the skin every 21 ( twenty-one) days. 4 each 0   atorvastatin (LIPITOR) 20 MG tablet Take 1 tablet (20 mg total) by mouth daily. 90 tablet 3   brimonidine-timolol (COMBIGAN) 0.2-0.5 % ophthalmic solution Place 1 drop into both eyes 2 (two) times daily.     Cholecalciferol 25 MCG (1000 UT) capsule Take 1,000 Units by mouth daily.      latanoprost (XALATAN) 0.005 % ophthalmic solution Place 1 drop into both eyes at bedtime.     LOTEMAX 0.5 % GEL Place 1 drop into the left eye daily.     polyethylene glycol (MIRALAX / GLYCOLAX)  17 g packet Take 17 g by mouth daily as needed (Constipation).     potassium citrate (UROCIT-K) 10 MEQ (1080 MG) SR tablet Take 10 mEq by mouth 3 (three) times daily.     psyllium (METAMUCIL SMOOTH TEXTURE) 28 % packet Take 1 packet by mouth 2 (two) times daily.     No current facility-administered medications on file prior to visit.    No Known Allergies  Objective: Physical Exam  General: Well developed, nourished, no acute distress, awake, alert and oriented x 3  Vascular: Dorsalis pedis artery 2/4 bilateral, Posterior tibial artery 1/4 bilateral, skin temperature warm to warm proximal to distal bilateral lower extremities, no varicosities, Trace edema bilateral ankles, pedal hair present bilateral.  Neurological: Gross sensation present via light touch bilateral.   Dermatological: Skin is warm, dry, and supple bilateral, Nails 1-10 are tender, long, thick, and discolored with mild subungal debris, no webspace macerations present bilateral, no open lesions present bilateral, no callus/corns/hyperkeratotic tissue present bilateral. No signs of infection bilateral.  Musculoskeletal:  Asymptomatic hammertoe boney deformities noted bilateral. Muscular strength within normal limits without painon range of motion. No pain with calf compression bilateral.  Assessment and Plan:  Problem List Items Addressed This Visit   None Visit Diagnoses     Pain due to onychomycosis of toenails of both feet    -  Primary      -Examined patient.  -Re-Discussed treatment options for painful mycotic nails. -Mechanically debrided and reduced  mycotic nails with sterile nail nipper and dremel nail file without incident. -Patient to return in 3 months for follow up evaluation or sooner if symptoms worsen.  Landis Martins, DPM

## 2021-07-23 NOTE — Therapy (Signed)
[facility] Physical Therapy [address] [place], [place], [address] Phone: [phone]   Fax:  [phone]  Physical Therapy Treatment  Patient Details  Name: Antonio Ayers MRN: [id number] Date of Birth: Jan 21, 1951 Referring Provider (PT): Ofilia Neas, PA-C   Encounter Date: 07/23/2021   PT End of Session - 07/23/21 1152     Visit Number 8    Number of Visits 15    Date for PT Re-Evaluation 08/10/21    Authorization Type UHC MCR    Progress Note Due on Visit 10    PT Start Time 1100    PT Stop Time 2951    PT Time Calculation (min) 45 min    Activity Tolerance Patient tolerated treatment well    Behavior During Therapy [facility] for tasks assessed/performed             Past Medical History:  Diagnosis Date   Abdominal hernia    Agatston coronary artery calcium score greater than 400    coronary Ca 454 on CT 2021   Ankylosing spondylitis ([place])    Aortic aneurysm (HCC)    mildly dilated ascending aorta at 60mm and aortic root 60mm by echo 03/2020   Aortic insufficiency    Chronic diarrhea    Colon polyp    Complication of anesthesia    GERD (gastroesophageal reflux disease)    Headache    Heart murmur    History of hiatal hernia    History of kidney stones    PONV (postoperative nausea and vomiting)     Past Surgical History:  Procedure Laterality Date   CATARACT EXTRACTION Left 2016   CHOLECYSTECTOMY  01/2018   COLONOSCOPY     02/2015   CORNEAL TRANSPLANT  1989, 1992, 2014   HEMORRHOID SURGERY N/A 03/20/2021   Procedure: HEMORRHOIDECTOMY WITH LIGATION AND HEMORRHOIDOPEXY;  Surgeon: Michael Boston, MD;  Location: [facility];  Service: General;  Laterality: N/A;   HERNIA REPAIR  2018   HIATAL HERNIA REPAIR     KIDNEY STONE SURGERY  03/2018   MASS EXCISION N/A 03/20/2021   Procedure: REMOVAL OF ANAL polyp x2;  Surgeon: Michael Boston, MD;  Location: [facility];  Service: General;  Laterality: N/A;   PROSTATE BIOPSY     x3   RECTAL EXAM UNDER  ANESTHESIA N/A 03/20/2021   Procedure: ANORECTAL EXAM UNDER ANESTHESIA;  Surgeon: Michael Boston, MD;  Location: [facility];  Service: General;  Laterality: N/A;   UPPER GASTROINTESTINAL ENDOSCOPY      There were no vitals filed for this visit.   Subjective Assessment - 07/23/21 1151     Subjective relays overall feels a little better this week    Pertinent History PMH: OA,GERD,aortic aneurysm    Diagnostic tests XR    Patient Stated Goals reduce pain and stiffness    Pain Onset More than a month ago                               [facility] Adult PT Treatment/Exercise - 07/23/21 0001       Lumbar Exercises: Stretches   Other Lumbar Stretch Exercise standing hip flexor stretch 15 sec X 3 bilat    Other Lumbar Stretch Exercise standing lumbar flexion stretch with 10# KB 10 sec X5      Lumbar Exercises: Aerobic   Nustep L6 X 10 min LE/UE      Lumbar Exercises: Machines for Strengthening   Leg Press DL  106# 3X15 into as much hip/knee flexion as tolerated      Lumbar Exercises: Standing   Other Standing Lumbar Exercises squats with UE support at sink with chair touch to chair with airex pad in it 2X10    Other Standing Lumbar Exercises lateral walking and retro walking 3 round trips no UE support. Tandem balance 30 sec  X3 bilat with intermitt UE support      Manual Therapy   Manual therapy comments bilat hip/knee flexion PROM, hip abd PROM manual hamstring stretching, bilateral long axis distraction, bilateral neck sidebending and rotation PROM, neck extension PROM                       PT Short Term Goals - 06/25/21 1114       PT SHORT TERM GOAL #1   Title Pt will be I and compliant with intial HEP    Time 4    Period Weeks    Status Achieved    Target Date 06/29/21      PT SHORT TERM GOAL #2   Title Pt will improve neck ROM by 5 deg each plane    Time 4    Period Weeks    Status On-going               PT Long Term Goals - 06/25/21 1115        PT LONG TERM GOAL #1   Title Pt will report less than 4/10 overall pain with ADL's    Time 10    Period Weeks    Status On-going      PT LONG TERM GOAL #2   Title Pt will improve neck ROM by 10 deg each plane for improved scanning of his enviornment    Time 10    Status On-going      PT LONG TERM GOAL #3   Title Pt will report improved ability to navigate stairs and have less pain with this    Time 10    Period Weeks    Status On-going      PT LONG TERM GOAL #4   Title Pt will improve 5TSTS test to <20 seconds to show improved muscular endurance    Status On-going      PT LONG TERM GOAL #5   Title Pt will improve TUG time to 13 seconds with LRAD to show improved gait speed and balance    Time 10    Period Weeks    Status On-going                   Plan - 07/23/21 1152     Clinical Impression Statement He continues to be limited by wide spread joint stiffness. He has 2 more visits scheduled so we will do reassessment next visit to determine need to continue vs discharge. We will need to update and consolidate HEP as well.    Personal Factors and Comorbidities Comorbidity 3+;Time since onset of injury/illness/exacerbation    Comorbidities QIO:[id number] spine, bilat hip/knee OA,GERD,aortic aneurysm    Examination-Activity Limitations Bend;Carry;Lift;Stand;Stairs;Squat;Locomotion Level;Reach Overhead;Transfers    Examination-Participation Restrictions Cleaning;Community Activity;Driving    Stability/Clinical Decision Making Evolving/Moderate complexity    Rehab Potential Good    PT Frequency 2x / week    PT Duration Other (comment)   10 weeks   PT Treatment/Interventions Aquatic Therapy;Cryotherapy;Programmer, systems;Therapeutic activities;Therapeutic exercise;Neuromuscular re-education;Manual techniques;Dry needling;Passive range of motion;Taping;Joint Manipulations    PT Next Visit Plan progress note  and update  HEP    PT Home Exercise Plan Access Code: [email or web address]    Consulted and Agree with Plan of Care Patient             Patient will benefit from skilled therapeutic intervention in order to improve the following deficits and impairments:  Decreased activity tolerance, Decreased balance, Decreased endurance, Decreased mobility, Decreased range of motion, Decreased strength, Difficulty walking, Hypomobility, Postural dysfunction, Other (comment), Pain  Visit Diagnosis: Cervicalgia  Chronic bilateral low back pain without sciatica  Other abnormalities of gait and mobility  Muscle weakness (generalized)  Abnormal posture     Problem List Patient Active Problem List   Diagnosis Date Noted   Agatston coronary artery calcium score greater than 400    Aortic aneurysm (HCC)    Aortic valve regurgitation 01/09/2019   Nephrolithiasis 01/09/2019   BPH (benign prostatic hyperplasia) 01/09/2019   Overweight (BMI 25.0-29.9) 01/09/2019   Primary osteoarthritis of both hips 12/29/2018   Primary osteoarthritis of both knees 12/29/2018   Raised intraocular pressure of both eyes 12/19/2018   Ankylosing spondylitis of multiple sites in spine ([place]) 12/19/2018   Hiatal hernia with GERD 11/08/2018   History of colonoscopy 01/07/2018   Gall stone in bile duct with infection of gallbladder 12/14/2017   Hernia of abdominal cavity 12/07/2016   Dislocated elbow, left, initial encounter 10/07/2016   Enlarged liver 12/08/2006   Aortic anomaly 01/07/2005   Fracture 02/17/2004   Keratoconus 12/07/1977    Debbe Odea, PT,DPT 07/23/2021, 12:07 PM  [facility] Physical Therapy [address] [place], [place], [address] Phone: [phone]   Fax:  [phone]  Name: Antonio Ayers MRN: [id number] Date of Birth: 1951-03-16

## 2021-07-28 ENCOUNTER — Other Ambulatory Visit: Payer: Self-pay

## 2021-07-28 ENCOUNTER — Ambulatory Visit: Payer: Medicare Other | Admitting: Physical Therapy

## 2021-07-28 DIAGNOSIS — G8929 Other chronic pain: Secondary | ICD-10-CM

## 2021-07-28 DIAGNOSIS — M6281 Muscle weakness (generalized): Secondary | ICD-10-CM | POA: Diagnosis not present

## 2021-07-28 DIAGNOSIS — M542 Cervicalgia: Secondary | ICD-10-CM

## 2021-07-28 DIAGNOSIS — R293 Abnormal posture: Secondary | ICD-10-CM

## 2021-07-28 DIAGNOSIS — M545 Low back pain, unspecified: Secondary | ICD-10-CM | POA: Diagnosis not present

## 2021-07-28 DIAGNOSIS — R2689 Other abnormalities of gait and mobility: Secondary | ICD-10-CM

## 2021-07-28 NOTE — Therapy (Signed)
[facility] Physical Therapy [address] [place], [place], [address] Phone: [phone]   Fax:  [phone]  Physical Therapy Treatment  Patient Details  Name: Antonio Ayers MRN: [id number] Date of Birth: Dec 27, 1950 Referring Provider (PT): Ofilia Neas, PA-C   Encounter Date: 07/28/2021   PT End of Session - 07/28/21 1124     Visit Number 9    Number of Visits 15    Date for PT Re-Evaluation 08/10/21    Authorization Type UHC MCR    Progress Note Due on Visit 10    PT Start Time 1058    PT Stop Time 9417    PT Time Calculation (min) 45 min    Activity Tolerance Patient tolerated treatment well    Behavior During Therapy [facility] for tasks assessed/performed             Past Medical History:  Diagnosis Date   Abdominal hernia    Agatston coronary artery calcium score greater than 400    coronary Ca 454 on CT 2021   Ankylosing spondylitis ([place])    Aortic aneurysm (HCC)    mildly dilated ascending aorta at 79mm and aortic root 12mm by echo 03/2020   Aortic insufficiency    Chronic diarrhea    Colon polyp    Complication of anesthesia    GERD (gastroesophageal reflux disease)    Headache    Heart murmur    History of hiatal hernia    History of kidney stones    PONV (postoperative nausea and vomiting)     Past Surgical History:  Procedure Laterality Date   CATARACT EXTRACTION Left 2016   CHOLECYSTECTOMY  01/2018   COLONOSCOPY     02/2015   CORNEAL TRANSPLANT  1989, 1992, 2014   HEMORRHOID SURGERY N/A 03/20/2021   Procedure: HEMORRHOIDECTOMY WITH LIGATION AND HEMORRHOIDOPEXY;  Surgeon: Michael Boston, MD;  Location: [facility];  Service: General;  Laterality: N/A;   HERNIA REPAIR  2018   HIATAL HERNIA REPAIR     KIDNEY STONE SURGERY  03/2018   MASS EXCISION N/A 03/20/2021   Procedure: REMOVAL OF ANAL polyp x2;  Surgeon: Michael Boston, MD;  Location: [facility];  Service: General;  Laterality: N/A;   PROSTATE BIOPSY     x3   RECTAL EXAM UNDER  ANESTHESIA N/A 03/20/2021   Procedure: ANORECTAL EXAM UNDER ANESTHESIA;  Surgeon: Michael Boston, MD;  Location: [facility];  Service: General;  Laterality: N/A;   UPPER GASTROINTESTINAL ENDOSCOPY      There were no vitals filed for this visit.   Subjective Assessment - 07/28/21 1125     Subjective not much pain overall but very stiff upon arrival.    Pertinent History PMH: OA,GERD,aortic aneurysm    Diagnostic tests XR    Patient Stated Goals reduce pain and stiffness    Pain Onset More than a month ago               [facility] Adult PT Treatment/Exercise - 07/28/21 0001       Neck Exercises: Theraband   Shoulder Extension 20 reps;Blue    Rows 20 reps;Blue      Lumbar Exercises: Stretches   Other Lumbar Stretch Exercise standing hip flexor stretch 20 sec X 3 bilat      Lumbar Exercises: Aerobic   Nustep L6 X 10 min LE/UE      Lumbar Exercises: Machines for Strengthening   Leg Press DL 106# 3X15 into as much hip/knee flexion as tolerated  Lumbar Exercises: Standing   Other Standing Lumbar Exercises lateral walking and march walking 2 round trips at mat table      Lumbar Exercises: Seated   Other Seated Lumbar Exercises sit to stand 2X10 no UE support from 24 inch      Manual Therapy   Manual therapy comments bilat hip/knee flexion PROM, hip abd PROM manual hamstring stretching, bilateral long axis distraction, bilateral neck sidebending and rotation PROM, neck extension PROM                       PT Short Term Goals - 06/25/21 1114       PT SHORT TERM GOAL #1   Title Pt will be I and compliant with intial HEP    Time 4    Period Weeks    Status Achieved    Target Date 06/29/21      PT SHORT TERM GOAL #2   Title Pt will improve neck ROM by 5 deg each plane    Time 4    Period Weeks    Status On-going               PT Long Term Goals - 06/25/21 1115       PT LONG TERM GOAL #1   Title Pt will report less than 4/10 overall pain with ADL's     Time 10    Period Weeks    Status On-going      PT LONG TERM GOAL #2   Title Pt will improve neck ROM by 10 deg each plane for improved scanning of his enviornment    Time 10    Status On-going      PT LONG TERM GOAL #3   Title Pt will report improved ability to navigate stairs and have less pain with this    Time 10    Period Weeks    Status On-going      PT LONG TERM GOAL #4   Title Pt will improve 5TSTS test to <20 seconds to show improved muscular endurance    Status On-going      PT LONG TERM GOAL #5   Title Pt will improve TUG time to 13 seconds with LRAD to show improved gait speed and balance    Time 10    Period Weeks    Status On-going                   Plan - 07/28/21 1155     Clinical Impression Statement He has one more PT visit scheduled and then feels ready to discharge to HEP. We will update HEP and review this with him next session.    Personal Factors and Comorbidities Comorbidity 3+;Time since onset of injury/illness/exacerbation    Comorbidities GMW:[id number] spine, bilat hip/knee OA,GERD,aortic aneurysm    Examination-Activity Limitations Bend;Carry;Lift;Stand;Stairs;Squat;Locomotion Level;Reach Overhead;Transfers    Examination-Participation Restrictions Cleaning;Community Activity;Driving    Stability/Clinical Decision Making Evolving/Moderate complexity    Rehab Potential Good    PT Frequency 2x / week    PT Duration Other (comment)   10 weeks   PT Treatment/Interventions Aquatic Therapy;Cryotherapy;Programmer, systems;Therapeutic activities;Therapeutic exercise;Neuromuscular re-education;Manual techniques;Dry needling;Passive range of motion;Taping;Joint Manipulations    PT Next Visit Plan DC to hep next visit    PT Home Exercise Plan Access Code: [email or web address]    Consulted and Agree with Plan of Care Patient             Patient will benefit  from skilled therapeutic intervention in order  to improve the following deficits and impairments:  Decreased activity tolerance, Decreased balance, Decreased endurance, Decreased mobility, Decreased range of motion, Decreased strength, Difficulty walking, Hypomobility, Postural dysfunction, Other (comment), Pain  Visit Diagnosis: Cervicalgia  Chronic bilateral low back pain without sciatica  Other abnormalities of gait and mobility  Muscle weakness (generalized)  Abnormal posture     Problem List Patient Active Problem List   Diagnosis Date Noted   Agatston coronary artery calcium score greater than 400    Aortic aneurysm (HCC)    Aortic valve regurgitation 01/09/2019   Nephrolithiasis 01/09/2019   BPH (benign prostatic hyperplasia) 01/09/2019   Overweight (BMI 25.0-29.9) 01/09/2019   Primary osteoarthritis of both hips 12/29/2018   Primary osteoarthritis of both knees 12/29/2018   Raised intraocular pressure of both eyes 12/19/2018   Ankylosing spondylitis of multiple sites in spine ([place]) 12/19/2018   Hiatal hernia with GERD 11/08/2018   History of colonoscopy 01/07/2018   Gall stone in bile duct with infection of gallbladder 12/14/2017   Hernia of abdominal cavity 12/07/2016   Dislocated elbow, left, initial encounter 10/07/2016   Enlarged liver 12/08/2006   Aortic anomaly 01/07/2005   Fracture 02/17/2004   Keratoconus 12/07/1977    Debbe Odea, PT,DPT 07/28/2021, 11:56 AM  [facility] Physical Therapy [address] [place], [place], [address] Phone: [phone]   Fax:  [phone]  Name: Imanol Bihl MRN: [id number] Date of Birth: 1950/09/28

## 2021-07-30 ENCOUNTER — Ambulatory Visit: Payer: Medicare Other | Admitting: Physical Therapy

## 2021-07-30 ENCOUNTER — Other Ambulatory Visit: Payer: Self-pay

## 2021-07-30 DIAGNOSIS — R293 Abnormal posture: Secondary | ICD-10-CM | POA: Diagnosis not present

## 2021-07-30 DIAGNOSIS — M542 Cervicalgia: Secondary | ICD-10-CM | POA: Diagnosis not present

## 2021-07-30 DIAGNOSIS — R2689 Other abnormalities of gait and mobility: Secondary | ICD-10-CM

## 2021-07-30 DIAGNOSIS — G8929 Other chronic pain: Secondary | ICD-10-CM | POA: Diagnosis not present

## 2021-07-30 DIAGNOSIS — M545 Low back pain, unspecified: Secondary | ICD-10-CM | POA: Diagnosis not present

## 2021-07-30 DIAGNOSIS — M6281 Muscle weakness (generalized): Secondary | ICD-10-CM

## 2021-07-30 NOTE — Patient Instructions (Signed)
Access Code: [email or web address] URL: [email or web address] Date: 07/30/2021 Prepared by: Elsie Ra  Exercises Seated Cervical Sidebending Stretch - 2 x daily - 6 x weekly - 1 sets - 2-3 reps - 30 sec hold Seated Assisted Cervical Rotation with Towel - 2 x daily - 6 x weekly - 10 reps - 1 sets - 5 hold Standing Hip Flexor Stretch - 2 x daily - 6 x weekly - 3 sets - 10 reps Mini Squat with Counter Support - 2 x daily - 6 x weekly - 1-2 sets - 10 reps Side Stepping with Counter Support - 2 x daily - 6 x weekly - 1 sets - 3-5 reps Standing Shoulder Row with Anchored Resistance - 2 x daily - 6 x weekly - 3 sets - 10 reps

## 2021-07-30 NOTE — Therapy (Signed)
[facility] [address] [place], [place], [address] Phone: [phone]   Fax:  [phone]  Physical Therapy Treatment/Progress note and Discharge PHYSICAL THERAPY DISCHARGE SUMMARY  Visits from Start of Care: 10  Current functional level related to goals / functional outcomes: See below   Remaining deficits: See below   Education / Equipment: HEP Plan: Patient agrees to discharge.  Patient goals were not met. Patient is being discharged due to his request to transition to HEP.    Progress Note reporting period 06/01/21 to 07/30/21  See below for objective and subjective measurements relating to patients progress with PT.    Patient Details  Name: Antonio Ayers MRN: [id number] Date of Birth: July 26, 1951 Referring Provider (PT): Ofilia Neas, PA-C   Encounter Date: 07/30/2021   PT End of Session - 07/30/21 1109     Visit Number 10    Number of Visits 15    Date for PT Re-Evaluation 08/10/21    Authorization Type UHC MCR    Progress Note Due on Visit 10    PT Start Time 1055    PT Stop Time 1140    PT Time Calculation (min) 45 min    Activity Tolerance Patient tolerated treatment well    Behavior During Therapy [facility] for tasks assessed/performed             Past Medical History:  Diagnosis Date   Abdominal hernia    Agatston coronary artery calcium score greater than 400    coronary Ca 454 on CT 2021   Ankylosing spondylitis ([place])    Aortic aneurysm (HCC)    mildly dilated ascending aorta at 40m and aortic root 375mby echo 03/2020   Aortic insufficiency    Chronic diarrhea    Colon polyp    Complication of anesthesia    GERD (gastroesophageal reflux disease)    Headache    Heart murmur    History of hiatal hernia    History of kidney stones    PONV (postoperative nausea and vomiting)     Past Surgical History:  Procedure Laterality Date   CATARACT EXTRACTION Left 2016   CHOLECYSTECTOMY  01/2018    COLONOSCOPY     02/2015   CORNEAL TRANSPLANT  1989, 1992, 2014   HEMORRHOID SURGERY N/A 03/20/2021   Procedure: HEMORRHOIDECTOMY WITH LIGATION AND HEMORRHOIDOPEXY;  Surgeon: GrMichael BostonMD;  Location: [facility];  Service: General;  Laterality: N/A;   HERNIA REPAIR  2018   HIATAL HERNIA REPAIR     KIDNEY STONE SURGERY  03/2018   MASS EXCISION N/A 03/20/2021   Procedure: REMOVAL OF ANAL polyp x2;  Surgeon: GrMichael BostonMD;  Location: [facility];  Service: General;  Laterality: N/A;   PROSTATE BIOPSY     x3   RECTAL EXAM UNDER ANESTHESIA N/A 03/20/2021   Procedure: ANORECTAL EXAM UNDER ANESTHESIA;  Surgeon: GrMichael BostonMD;  Location: [facility];  Service: General;  Laterality: N/A;   UPPER GASTROINTESTINAL ENDOSCOPY      There were no vitals filed for this visit.   Subjective Assessment - 07/30/21 1103     Subjective relays not much pain just continued stiffness. He feels ready to discharge to HEP today.                [facility] - 07/30/21 0001       Assessment   Medical Diagnosis M45.0 (ICD-10-CM) - Ankylosing spondylitis of multiple sites in spine (HCC)and OA of both hips and knees  Referring Provider (PT) Ofilia Neas, PA-C      Other:   Other/ Comments 5 times sit to stand he must use UE support to push up 17 seconds      Other:   Other/Comments TUG time 15 seconds without AD      AROM   Cervical Extension 5    Cervical - Right Side Bend 8    Cervical - Left Side Bend 7    Cervical - Right Rotation 40    Cervical - Left Rotation 35    Lumbar Flexion 50%    Lumbar Extension 50%    Lumbar - Right Side Bend 25%    Lumbar - Left Side Bend 50%    Lumbar - Right Rotation 50%    Lumbar - Left Rotation 50%      Strength   Overall Strength Comments 4+ gross UE/LE strength                           [facility] Adult PT Treatment/Exercise - 07/30/21 0001       Neck Exercises: Theraband   Shoulder Extension 20 reps;Blue    Rows 20 reps;Blue       Neck Exercises: Seated   Cervical Rotation 10 reps    Cervical Rotation Limitations AAROM X 5 sec bilat    Lateral Flexion 10 reps    Lateral Flexion Limitations AAROM X 5 sec bilat      Lumbar Exercises: Stretches   Other Lumbar Stretch Exercise standing hip flexor stretch 20 sec X 3 bilat      Lumbar Exercises: Aerobic   Nustep L6 X 10 min LE/UE      Lumbar Exercises: Machines for Strengthening   Leg Press DL 106# 3X15 into as much hip/knee flexion as tolerated      Lumbar Exercises: Standing   Other Standing Lumbar Exercises lateral walking and march walking 2 round trips at mat table      Manual Therapy   Manual therapy comments bilat hip/knee flexion PROM, hip abd PROM manual hamstring stretching, bilateral long axis distraction, bilateral neck sidebending and rotation PROM, neck extension PROM                       PT Short Term Goals - 07/30/21 1116       PT SHORT TERM GOAL #1   Title Pt will be I and compliant with intial HEP    Time 4    Period Weeks    Status Achieved    Target Date 06/29/21      PT SHORT TERM GOAL #2   Title Pt will improve neck ROM by 5 deg each plane    Time 4    Period Weeks    Status Achieved               PT Long Term Goals - 07/30/21 1116       PT LONG TERM GOAL #1   Title Pt will report less than 4/10 overall pain with ADL's    Time 10    Period Weeks    Status Achieved      PT LONG TERM GOAL #2   Title Pt will improve neck ROM by 10 deg each plane for improved scanning of his enviornment    Baseline improved 5 deg    Time 10    Status Not Met      PT LONG TERM  GOAL #3   Title Pt will report improved ability to navigate stairs and have less pain with this    Baseline unable due to hip flexion ROM limitation    Time 10    Period Weeks    Status Not Met      PT LONG TERM GOAL #4   Title Pt will improve 5TSTS test to <20 seconds to show improved muscular endurance    Baseline 17 sec today    Status  Achieved      PT LONG TERM GOAL #5   Title Pt will improve TUG time to 13 seconds with LRAD to show improved gait speed and balance    Baseline 15 sec today but without SPC    Time 10    Period Weeks    Status Not Met                   Plan - 07/30/21 1110     Clinical Impression Statement He has made small improvement in ROM and moderate imrovement in overall strength since beginning PT. His joint stiffness is a big limiting factor to his funciton and his overall progress with PT. This has his plateau it appears. He does feel ready to discharge to HEP so I updated this today to give him progressions. He showed good understanding of this and had no further quesitons.    Personal Factors and Comorbidities Comorbidity 3+;Time since onset of injury/illness/exacerbation    Comorbidities WPY:KDXIPJASN spine, bilat hip/knee OA,GERD,aortic aneurysm    Examination-Activity Limitations Bend;Carry;Lift;Stand;Stairs;Squat;Locomotion Level;Reach Overhead;Transfers    Examination-Participation Restrictions Cleaning;Community Activity;Driving    Stability/Clinical Decision Making Evolving/Moderate complexity    Rehab Potential Good    PT Frequency 2x / week    PT Duration Other (comment)   10 weeks   PT Treatment/Interventions Aquatic Therapy;Cryotherapy;Programmer, systems;Therapeutic activities;Therapeutic exercise;Neuromuscular re-education;Manual techniques;Dry needling;Passive range of motion;Taping;Joint Manipulations    PT Next Visit Plan DC today    PT Home Exercise Plan Access Code: [email or web address]    Consulted and Agree with Plan of Care Patient             Patient will benefit from skilled therapeutic intervention in order to improve the following deficits and impairments:  Decreased activity tolerance, Decreased balance, Decreased endurance, Decreased mobility, Decreased range of motion, Decreased strength, Difficulty walking,  Hypomobility, Postural dysfunction, Other (comment), Pain  Visit Diagnosis: Cervicalgia  Chronic bilateral low back pain without sciatica  Other abnormalities of gait and mobility  Muscle weakness (generalized)  Abnormal posture     Problem List Patient Active Problem List   Diagnosis Date Noted   Agatston coronary artery calcium score greater than 400    Aortic aneurysm (HCC)    Aortic valve regurgitation 01/09/2019   Nephrolithiasis 01/09/2019   BPH (benign prostatic hyperplasia) 01/09/2019   Overweight (BMI 25.0-29.9) 01/09/2019   Primary osteoarthritis of both hips 12/29/2018   Primary osteoarthritis of both knees 12/29/2018   Raised intraocular pressure of both eyes 12/19/2018   Ankylosing spondylitis of multiple sites in spine ([place]) 12/19/2018   Hiatal hernia with GERD 11/08/2018   History of colonoscopy 01/07/2018   Gall stone in bile duct with infection of gallbladder 12/14/2017   Hernia of abdominal cavity 12/07/2016   Dislocated elbow, left, initial encounter 10/07/2016   Enlarged liver 12/08/2006   Aortic anomaly 01/07/2005   Fracture 02/17/2004   Keratoconus 12/07/1977    Debbe Odea, PT,DPT 07/30/2021, 11:46 AM  [facility]  [address] [place], [place], [address] Phone: [phone]   Fax:  [phone]  Name: Antonio Ayers MRN: [id number] Date of Birth: 08/13/1950

## 2021-08-05 ENCOUNTER — Inpatient Hospital Stay: Payer: Medicare Other

## 2021-08-11 ENCOUNTER — Other Ambulatory Visit: Payer: Self-pay | Admitting: Hematology and Oncology

## 2021-08-11 ENCOUNTER — Other Ambulatory Visit: Payer: Self-pay | Admitting: *Deleted

## 2021-08-11 DIAGNOSIS — D472 Monoclonal gammopathy: Secondary | ICD-10-CM

## 2021-08-12 ENCOUNTER — Inpatient Hospital Stay: Payer: Medicare Other | Attending: Hematology and Oncology

## 2021-08-12 ENCOUNTER — Ambulatory Visit: Payer: Medicare Other | Admitting: Hematology and Oncology

## 2021-08-12 ENCOUNTER — Other Ambulatory Visit: Payer: Self-pay

## 2021-08-12 DIAGNOSIS — D801 Nonfamilial hypogammaglobulinemia: Secondary | ICD-10-CM | POA: Insufficient documentation

## 2021-08-12 DIAGNOSIS — Z8719 Personal history of other diseases of the digestive system: Secondary | ICD-10-CM | POA: Diagnosis not present

## 2021-08-12 DIAGNOSIS — Z833 Family history of diabetes mellitus: Secondary | ICD-10-CM | POA: Diagnosis not present

## 2021-08-12 DIAGNOSIS — Z79899 Other long term (current) drug therapy: Secondary | ICD-10-CM | POA: Insufficient documentation

## 2021-08-12 DIAGNOSIS — M459 Ankylosing spondylitis of unspecified sites in spine: Secondary | ICD-10-CM | POA: Diagnosis not present

## 2021-08-12 DIAGNOSIS — Z9049 Acquired absence of other specified parts of digestive tract: Secondary | ICD-10-CM | POA: Insufficient documentation

## 2021-08-12 DIAGNOSIS — R55 Syncope and collapse: Secondary | ICD-10-CM | POA: Diagnosis not present

## 2021-08-12 DIAGNOSIS — Z86010 Personal history of colonic polyps: Secondary | ICD-10-CM | POA: Insufficient documentation

## 2021-08-12 DIAGNOSIS — R42 Dizziness and giddiness: Secondary | ICD-10-CM | POA: Insufficient documentation

## 2021-08-12 DIAGNOSIS — Z8249 Family history of ischemic heart disease and other diseases of the circulatory system: Secondary | ICD-10-CM | POA: Insufficient documentation

## 2021-08-12 DIAGNOSIS — Z823 Family history of stroke: Secondary | ICD-10-CM | POA: Diagnosis not present

## 2021-08-12 DIAGNOSIS — Z8379 Family history of other diseases of the digestive system: Secondary | ICD-10-CM | POA: Insufficient documentation

## 2021-08-12 DIAGNOSIS — D472 Monoclonal gammopathy: Secondary | ICD-10-CM | POA: Insufficient documentation

## 2021-08-12 DIAGNOSIS — Z8349 Family history of other endocrine, nutritional and metabolic diseases: Secondary | ICD-10-CM | POA: Diagnosis not present

## 2021-08-12 LAB — CMP (CANCER CENTER ONLY)
ALT: 15 U/L (ref 0–44)
AST: 15 U/L (ref 15–41)
Albumin: 3.9 g/dL (ref 3.5–5.0)
Alkaline Phosphatase: 94 U/L (ref 38–126)
Anion gap: 7 (ref 5–15)
BUN: 23 mg/dL (ref 8–23)
CO2: 25 mmol/L (ref 22–32)
Calcium: 8.7 mg/dL — ABNORMAL LOW (ref 8.9–10.3)
Chloride: 107 mmol/L (ref 98–111)
Creatinine: 0.8 mg/dL (ref 0.61–1.24)
GFR, Estimated: >60 mL/min (ref >60)
Glucose, Bld: 121 mg/dL — ABNORMAL HIGH (ref 70–99)
Potassium: 4 mmol/L (ref 3.5–5.1)
Sodium: 139 mmol/L (ref 135–145)
Total Bilirubin: 2.7 mg/dL — ABNORMAL HIGH (ref 0.3–1.2)
Total Protein: 6.6 g/dL (ref 6.5–8.1)

## 2021-08-12 LAB — CBC WITH DIFFERENTIAL (CANCER CENTER ONLY)
Abs Immature Granulocytes: 0.02 10*3/uL (ref 0.00–0.07)
Basophils Absolute: 0 10*3/uL (ref 0.0–0.1)
Basophils Relative: 0 %
Eosinophils Absolute: 0.1 10*3/uL (ref 0.0–0.5)
Eosinophils Relative: 1 %
HCT: 40.3 % (ref 39.0–52.0)
Hemoglobin: 14.4 g/dL (ref 13.0–17.0)
Immature Granulocytes: 0 %
Lymphocytes Relative: 12 %
Lymphs Abs: 0.9 10*3/uL (ref 0.7–4.0)
MCH: 31.9 pg (ref 26.0–34.0)
MCHC: 35.7 g/dL (ref 30.0–36.0)
MCV: 89.4 fL (ref 80.0–100.0)
Monocytes Absolute: 0.7 10*3/uL (ref 0.1–1.0)
Monocytes Relative: 9 %
Neutro Abs: 6.1 10*3/uL (ref 1.7–7.7)
Neutrophils Relative %: 78 %
Platelet Count: 152 10*3/uL (ref 150–400)
RBC: 4.51 MIL/uL (ref 4.22–5.81)
RDW: 12.4 % (ref 11.5–15.5)
WBC Count: 7.8 10*3/uL (ref 4.0–10.5)
nRBC: 0 % (ref 0.0–0.2)

## 2021-08-12 LAB — CEA (IN HOUSE-CHCC): CEA (CHCC-In House): 1.73 ng/mL (ref 0.00–5.00)

## 2021-08-13 LAB — KAPPA/LAMBDA LIGHT CHAINS
Kappa free light chain: 25.2 mg/L — ABNORMAL HIGH (ref 3.3–19.4)
Kappa, lambda light chain ratio: 1.47 (ref 0.26–1.65)
Lambda free light chains: 17.2 mg/L (ref 5.7–26.3)

## 2021-08-13 LAB — BETA 2 MICROGLOBULIN, SERUM: Beta-2 Microglobulin: 2.1 mg/L (ref 0.6–2.4)

## 2021-08-17 LAB — MULTIPLE MYELOMA PANEL, SERUM
Albumin SerPl Elph-Mcnc: 3.5 g/dL (ref 2.9–4.4)
Albumin/Glob SerPl: 1.4 (ref 0.7–1.7)
Alpha 1: 0.2 g/dL (ref 0.0–0.4)
Alpha2 Glob SerPl Elph-Mcnc: 0.6 g/dL (ref 0.4–1.0)
B-Globulin SerPl Elph-Mcnc: 1.1 g/dL (ref 0.7–1.3)
Gamma Glob SerPl Elph-Mcnc: 0.7 g/dL (ref 0.4–1.8)
Globulin, Total: 2.6 g/dL (ref 2.2–3.9)
IgA: 462 mg/dL — ABNORMAL HIGH (ref 61–437)
IgG (Immunoglobin G), Serum: 771 mg/dL (ref 603–1613)
IgM (Immunoglobulin M), Srm: 34 mg/dL (ref 15–143)
Total Protein ELP: 6.1 g/dL (ref 6.0–8.5)

## 2021-08-19 ENCOUNTER — Encounter: Payer: Self-pay | Admitting: Hematology and Oncology

## 2021-08-19 ENCOUNTER — Inpatient Hospital Stay: Payer: Medicare Other | Admitting: Hematology and Oncology

## 2021-08-19 ENCOUNTER — Other Ambulatory Visit: Payer: Self-pay

## 2021-08-19 VITALS — BP 110/71 | HR 103 | Temp 97.3°F | Resp 17 | Wt 222.5 lb

## 2021-08-19 DIAGNOSIS — R42 Dizziness and giddiness: Secondary | ICD-10-CM | POA: Diagnosis not present

## 2021-08-19 DIAGNOSIS — K620 Anal polyp: Secondary | ICD-10-CM

## 2021-08-19 DIAGNOSIS — Z79899 Other long term (current) drug therapy: Secondary | ICD-10-CM | POA: Diagnosis not present

## 2021-08-19 DIAGNOSIS — Z833 Family history of diabetes mellitus: Secondary | ICD-10-CM | POA: Diagnosis not present

## 2021-08-19 DIAGNOSIS — Z823 Family history of stroke: Secondary | ICD-10-CM | POA: Diagnosis not present

## 2021-08-19 DIAGNOSIS — R55 Syncope and collapse: Secondary | ICD-10-CM | POA: Diagnosis not present

## 2021-08-19 DIAGNOSIS — Z9049 Acquired absence of other specified parts of digestive tract: Secondary | ICD-10-CM | POA: Diagnosis not present

## 2021-08-19 DIAGNOSIS — D472 Monoclonal gammopathy: Secondary | ICD-10-CM | POA: Diagnosis not present

## 2021-08-19 DIAGNOSIS — Z8719 Personal history of other diseases of the digestive system: Secondary | ICD-10-CM | POA: Diagnosis not present

## 2021-08-19 DIAGNOSIS — Z8349 Family history of other endocrine, nutritional and metabolic diseases: Secondary | ICD-10-CM | POA: Diagnosis not present

## 2021-08-19 DIAGNOSIS — Z8249 Family history of ischemic heart disease and other diseases of the circulatory system: Secondary | ICD-10-CM | POA: Diagnosis not present

## 2021-08-19 DIAGNOSIS — D801 Nonfamilial hypogammaglobulinemia: Secondary | ICD-10-CM | POA: Diagnosis not present

## 2021-08-19 DIAGNOSIS — M459 Ankylosing spondylitis of unspecified sites in spine: Secondary | ICD-10-CM | POA: Diagnosis not present

## 2021-08-19 DIAGNOSIS — Z8379 Family history of other diseases of the digestive system: Secondary | ICD-10-CM | POA: Diagnosis not present

## 2021-08-19 DIAGNOSIS — Z86010 Personal history of colonic polyps: Secondary | ICD-10-CM | POA: Diagnosis not present

## 2021-08-19 NOTE — Progress Notes (Signed)
[employer] [phone]   [phone]  PROGRESS NOTE  Patient Care Team: [name], [name], MD as PCP - General (Internal Medicine) Sueanne Margarita, MD as PCP - Cardiology (Cardiology) Viona Gilmore, [facility] as Pharmacist (Pharmacist) Michael Boston, MD as Consulting Physician (General Surgery) Danis, Kirke Corin, MD as Consulting Physician (Gastroenterology) Sueanne Margarita, MD as Consulting Physician (Cardiology) Bo Merino, MD as Consulting Physician (Rheumatology) Irine Seal, MD as Attending Physician (Urology)  Hematological/Oncological History # Hypogammaglobulinemia # Monoclonal Gammopathy of Undetermined Signficance  01/26/2019: last visit with Dr. Walden Field. Noted to have history of hypogammaglobulinemia.  12/18/2020: SPEP showed poorly defined bane of restrict protein. IFE showed faint IgA kappa.  01/26/2021: establish care with Dr. Lorenso Courier  #Anal Polyp 11/06/2020: large anal polyp noted on colonoscopy 03/20/2021: resected, found to be hemorrhoidal tissue.   Interval History:  Antonio Ayers 71 y.o. male with medical history significant for MGUS/hypogammaglobulinemia who presents for a follow up visit. The patient's last visit was on 01/26/2021. In the interim since the last visit he underwent surgical resection of his anal polyp which was found to be hemorrhoidal tissue.  On exam today Antonio Ayers reports the surgery for his anal polyp went quite well.  He notes that the surgery was performed by Dr. Johney Maine and he has not required any follow-up since that time.  He has not been having any issues with constipation or blood in his stool.  He reports that his stools have been quite soft without any pain or discomfort since that time.  He notes that he has been having issues with dizziness for which he has been seen by "various doctors".  He has been undergoing some changes in his glaucoma medications for this.  He denies any bleeding,  bruising, or dark stools.  He otherwise denies any fevers, chills, sweats, nausea, vomiting or diarrhea.  A full 10 point ROS is listed below.  MEDICAL HISTORY:  Past Medical History:  Diagnosis Date   Abdominal hernia    Agatston coronary artery calcium score greater than 400    coronary Ca 454 on CT 2021   Ankylosing spondylitis (HCC)    Aortic aneurysm (HCC)    mildly dilated ascending aorta at 43m and aortic root 343mby echo 03/2020   Aortic insufficiency    Chronic diarrhea    Colon polyp    Complication of anesthesia    GERD (gastroesophageal reflux disease)    Headache    Heart murmur    History of hiatal hernia    History of kidney stones    PONV (postoperative nausea and vomiting)     SURGICAL HISTORY: Past Surgical History:  Procedure Laterality Date   CATARACT EXTRACTION Left 2016   CHOLECYSTECTOMY  01/2018   COLONOSCOPY     02/2015   CORNEAL TRANSPLANT  1989, 1992, 2014   HEMORRHOID SURGERY N/A 03/20/2021   Procedure: HEMORRHOIDECTOMY WITH LIGATION AND HEMORRHOIDOPEXY;  Surgeon: GrMichael BostonMD;  Location: [facility];  Service: General;  Laterality: N/A;   HERNIA REPAIR  2018   HIATAL HERNIA REPAIR     KIDNEY STONE SURGERY  03/2018   MASS EXCISION N/A 03/20/2021   Procedure: REMOVAL OF ANAL polyp x2;  Surgeon: GrMichael BostonMD;  Location: [facility];  Service: General;  Laterality: N/A;   PROSTATE BIOPSY     x3   RECTAL EXAM UNDER ANESTHESIA N/A 03/20/2021   Procedure: ANORECTAL EXAM UNDER ANESTHESIA;  Surgeon: GrMichael BostonMD;  Location:  [facility];  Service: General;  Laterality: N/A;   UPPER GASTROINTESTINAL ENDOSCOPY      SOCIAL HISTORY: Social History   Socioeconomic History   Marital status: Single    Spouse name: Not on file   Number of children: Not on file   Years of education: Not on file   Highest education level: Not on file  Occupational History   Occupation: retired   Tobacco Use   Smoking status: Never   Smokeless tobacco: Never  Vaping Use    Vaping Use: Never used  Substance and Sexual Activity   Alcohol use: Yes    Comment: Occasionally beer   Drug use: Never   Sexual activity: Not on file  Other Topics Concern   Not on file  Social History Narrative   Not on file   Social Determinants of Health   Financial Resource Strain: Not on file  Food Insecurity: Not on file  Transportation Needs: Not on file  Physical Activity: Not on file  Stress: Not on file  Social Connections: Not on file  Intimate Partner Violence: Not on file    FAMILY HISTORY: Family History  Problem Relation Age of Onset   Stroke Mother    Stroke Father    Heart failure Father    Diabetes Sister    Obesity Sister    Hiatal hernia Sister    Colon cancer Neg Hx    Esophageal cancer Neg Hx    Rectal cancer Neg Hx    Stomach cancer Neg Hx     ALLERGIES:  has No Known Allergies.  MEDICATIONS:  Current Outpatient Medications  Medication Sig Dispense Refill   acetaminophen (TYLENOL) 325 MG tablet Take 325 mg by mouth every 6 (six) hours as needed for mild pain or moderate pain.     Adalimumab (HUMIRA PEN) 40 MG/0.4ML PNKT Inject 40 mg into the skin every 21 ( twenty-one) days. 4 each 0   atorvastatin (LIPITOR) 20 MG tablet Take 1 tablet (20 mg total) by mouth daily. 90 tablet 3   brimonidine (ALPHAGAN) 0.15 % ophthalmic solution 1 drop 2 (two) times daily.     brimonidine-timolol (COMBIGAN) 0.2-0.5 % ophthalmic solution Place 1 drop into both eyes 2 (two) times daily.     Cholecalciferol 25 MCG (1000 UT) capsule Take 1,000 Units by mouth daily.      dorzolamide (TRUSOPT) 2 % ophthalmic solution 1 drop 2 (two) times daily.     latanoprost (XALATAN) 0.005 % ophthalmic solution Place 1 drop into both eyes at bedtime.     LOTEMAX 0.5 % GEL Place 1 drop into the left eye daily.     PFIZER COVID-19 VAC BIVALENT injection      polyethylene glycol (MIRALAX / GLYCOLAX) 17 g packet Take 17 g by mouth daily as needed (Constipation).     potassium  citrate (UROCIT-K) 10 MEQ (1080 MG) SR tablet Take 10 mEq by mouth 3 (three) times daily.     psyllium (METAMUCIL SMOOTH TEXTURE) 28 % packet Take 1 packet by mouth 2 (two) times daily.     No current facility-administered medications for this visit.    REVIEW OF SYSTEMS:   Constitutional: ( - ) fevers, ( - )  chills , ( - ) night sweats Eyes: ( - ) blurriness of vision, ( - ) double vision, ( - ) watery eyes Ears, nose, mouth, throat, and face: ( - ) mucositis, ( - ) sore throat Respiratory: ( - ) cough, ( - )  dyspnea, ( - ) wheezes Cardiovascular: ( - ) palpitation, ( - ) chest discomfort, ( - ) lower extremity swelling Gastrointestinal:  ( - ) nausea, ( - ) heartburn, ( - ) change in bowel habits Skin: ( - ) abnormal skin rashes Lymphatics: ( - ) new lymphadenopathy, ( - ) easy bruising Neurological: ( - ) numbness, ( - ) tingling, ( - ) new weaknesses Behavioral/Psych: ( - ) mood change, ( - ) new changes  All other systems were reviewed with the patient and are negative.  PHYSICAL EXAMINATION: ECOG PERFORMANCE STATUS: 1 - Symptomatic but completely ambulatory  Vitals:   08/19/21 0931  BP: 110/71  Pulse: (!) 103  Resp: 17  Temp: (!) 97.3 F (36.3 C)  SpO2: 97%   Filed Weights   08/19/21 0931  Weight: 222 lb 8 oz (100.9 kg)    GENERAL: Well-appearing elderly Caucasian male, alert, no distress and comfortable SKIN: skin color, texture, turgor are normal, no rashes or significant lesions EYES: conjunctiva are pink and non-injected, sclera clear LUNGS: clear to auscultation and percussion with normal breathing effort HEART: regular rate & rhythm and no murmurs and no lower extremity edema PSYCH: alert & oriented x 3, fluent speech NEURO: no focal motor/sensory deficits  LABORATORY DATA:  I have reviewed the data as listed CBC Latest Ref Rng & Units 08/12/2021 07/08/2021 04/23/2021  WBC 4.0 - 10.5 K/uL 7.8 6.6 7.8  Hemoglobin 13.0 - 17.0 g/dL 14.4 15.3 14.5  Hematocrit  39.0 - 52.0 % 40.3 44.1 41.8  Platelets 150 - 400 K/uL 152 170.0 178    CMP Latest Ref Rng & Units 08/12/2021 07/08/2021 04/23/2021  Glucose 70 - 99 mg/dL 121(H) 94 91  BUN 8 - 23 mg/dL _0 Creatinine 0.61 - 1.24 mg/dL 0.80 0.67(L) 0.64(L)  Sodium 135 - 145 mmol/L 139 139 136  Potassium 3.5 - 5.1 mmol/L 4.0 4.4 4.3  Chloride 98 - 111 mmol/L 107 104 99  CO2 22 - 32 mmol/L _1 Calcium 8.9 - 10.3 mg/dL 8.7(L) 9.0 9.0  Total Protein 6.5 - 8.1 g/dL 6.6 6.7 6.3  Total Bilirubin 0.3 - 1.2 mg/dL 2.7(H) 3.3(H) 2.5(H)  Alkaline Phos 38 - 126 U/L 94 - -  AST 15 - 41 U/L _2 ALT 0 - 44 U/L _3 Lab Results  Component Value Date   MPROTEIN Not Observed 08/12/2021   Lab Results  Component Value Date   KPAFRELGTCHN 25.2 (H) 08/12/2021   KPAFRELGTCHN 14.0 01/12/2019   LAMBDASER 17.2 08/12/2021   LAMBDASER 8.3 01/12/2019   KAPLAMBRATIO 1.47 08/12/2021   KAPLAMBRATIO 1.69 (H) 01/12/2019    RADIOGRAPHIC STUDIES: No results found.  ASSESSMENT & PLAN Antonio Ayers 6` y.o. male with medical history significant for MGUS who presents for a follow up visit.   # Hypogammaglobulinemia-resolved.  # IgA Kappa Monoclonal Gammopathy of Undetermined Signficance  --patient has an IgA kappa MGUS. Protein levels are barely detectable on SPEP/IFE --at each visit will order CBC, CMP, LDH, SPEP, and SFLC --no evidence of hypogammaglobulinemia on labs today --assure yearly UPEP and metastatic survey. Due at this time.  --no clear indication for bone marrow biopsy at this time --RTC in 6 months for continued monitoring  # Hemorrhoids appearing as Anal Polyp --underwent surgery on 03/20/21.  --pathology showed Squamocolumnar junction with dilated submucosa vessels consistent with hemorrhoids  Orders Placed This Encounter  Procedures   DG Bone Survey Met  Standing Status:   Future    Standing Expiration Date:   08/19/2022    Order Specific Question:   Reason for Exam  (SYMPTOM  OR DIAGNOSIS REQUIRED)    Answer:   MGUS, assess for lytic    Order Specific Question:   Preferred imaging location?    Answer:   [facility]   24-Hr Ur UPEP/UIFE/Light Chains/TP    Standing Status:   Future    Standing Expiration Date:   08/19/2022   All questions were answered. The patient knows to call the clinic with any problems, questions or concerns.  A total of more than 30 minutes were spent on this encounter with face-to-face time and non-face-to-face time, including preparing to see the patient, ordering tests and/or medications, counseling the patient and coordination of care as outlined above.   Ledell Peoples, MD Department of Hematology/Oncology [place] [facility] Phone: [phone] Pager: [phone] Email: [email or web address]  08/19/2021 10:36 AM

## 2021-08-20 ENCOUNTER — Telehealth: Payer: Self-pay | Admitting: Hematology and Oncology

## 2021-08-20 DIAGNOSIS — H401132 Primary open-angle glaucoma, bilateral, moderate stage: Secondary | ICD-10-CM | POA: Diagnosis not present

## 2021-08-20 NOTE — Telephone Encounter (Signed)
Scheduled per 1/11 los, calender has been mailed to pt

## 2021-08-26 ENCOUNTER — Encounter (HOSPITAL_COMMUNITY): Payer: Self-pay

## 2021-08-26 ENCOUNTER — Ambulatory Visit (HOSPITAL_COMMUNITY): Payer: Medicare Other

## 2021-09-01 ENCOUNTER — Other Ambulatory Visit: Payer: Self-pay

## 2021-09-01 ENCOUNTER — Ambulatory Visit (HOSPITAL_COMMUNITY)
Admission: RE | Admit: 2021-09-01 | Discharge: 2021-09-01 | Disposition: A | Discharge status: Home / Self Care | Payer: Medicare Other | Source: Ambulatory Visit | Attending: Hematology and Oncology | Admitting: Hematology and Oncology

## 2021-09-01 DIAGNOSIS — D472 Monoclonal gammopathy: Secondary | ICD-10-CM | POA: Insufficient documentation

## 2021-09-03 ENCOUNTER — Other Ambulatory Visit: Payer: Self-pay | Admitting: *Deleted

## 2021-09-03 ENCOUNTER — Telehealth: Payer: Self-pay | Admitting: *Deleted

## 2021-09-03 DIAGNOSIS — R55 Syncope and collapse: Secondary | ICD-10-CM | POA: Diagnosis not present

## 2021-09-03 DIAGNOSIS — Z833 Family history of diabetes mellitus: Secondary | ICD-10-CM | POA: Diagnosis not present

## 2021-09-03 DIAGNOSIS — R42 Dizziness and giddiness: Secondary | ICD-10-CM | POA: Diagnosis not present

## 2021-09-03 DIAGNOSIS — Z8379 Family history of other diseases of the digestive system: Secondary | ICD-10-CM | POA: Diagnosis not present

## 2021-09-03 DIAGNOSIS — D472 Monoclonal gammopathy: Secondary | ICD-10-CM | POA: Diagnosis not present

## 2021-09-03 DIAGNOSIS — Z8719 Personal history of other diseases of the digestive system: Secondary | ICD-10-CM | POA: Diagnosis not present

## 2021-09-03 DIAGNOSIS — D801 Nonfamilial hypogammaglobulinemia: Secondary | ICD-10-CM | POA: Diagnosis not present

## 2021-09-03 DIAGNOSIS — Z8349 Family history of other endocrine, nutritional and metabolic diseases: Secondary | ICD-10-CM | POA: Diagnosis not present

## 2021-09-03 DIAGNOSIS — Z8249 Family history of ischemic heart disease and other diseases of the circulatory system: Secondary | ICD-10-CM | POA: Diagnosis not present

## 2021-09-03 DIAGNOSIS — Z9049 Acquired absence of other specified parts of digestive tract: Secondary | ICD-10-CM | POA: Diagnosis not present

## 2021-09-03 DIAGNOSIS — M459 Ankylosing spondylitis of unspecified sites in spine: Secondary | ICD-10-CM | POA: Diagnosis not present

## 2021-09-03 DIAGNOSIS — Z79899 Other long term (current) drug therapy: Secondary | ICD-10-CM | POA: Diagnosis not present

## 2021-09-03 DIAGNOSIS — Z823 Family history of stroke: Secondary | ICD-10-CM | POA: Diagnosis not present

## 2021-09-03 DIAGNOSIS — Z86010 Personal history of colonic polyps: Secondary | ICD-10-CM | POA: Diagnosis not present

## 2021-09-03 NOTE — Telephone Encounter (Signed)
-----  Message from Orson Slick, MD sent at 09/03/2021  9:47 AM EST ----- Please let Mr. Smithey know that his Met survey showed no evidence of lytic lesions. We will plan to see him back as scheduled in Jan 2024.   ----- Message ----- From: Buel Ream, Rad Results In Sent: 09/02/2021   8:40 PM EST To: Orson Slick, MD

## 2021-09-03 NOTE — Telephone Encounter (Signed)
TCT patient regarding results of recent  bone survey. No answer but was able to leave vm message on identified vm . Advised that that his bone survey did not show any lytic lesions. Advised that once we get his 24 urine results back, we would let him know the results of that as well. Advised that we will see him again in January 2024. Provided call back # of [phone] if he had any questions or concerns

## 2021-09-07 DIAGNOSIS — H5213 Myopia, bilateral: Secondary | ICD-10-CM | POA: Diagnosis not present

## 2021-09-07 DIAGNOSIS — H2511 Age-related nuclear cataract, right eye: Secondary | ICD-10-CM | POA: Diagnosis not present

## 2021-09-07 DIAGNOSIS — Z961 Presence of intraocular lens: Secondary | ICD-10-CM | POA: Diagnosis not present

## 2021-09-07 DIAGNOSIS — H401132 Primary open-angle glaucoma, bilateral, moderate stage: Secondary | ICD-10-CM | POA: Diagnosis not present

## 2021-09-10 LAB — UPEP/UIFE/LIGHT CHAINS/TP, 24-HR UR
% BETA, Urine: 0 %
ALPHA 1 URINE: 0 %
Albumin, U: 100 %
Alpha 2, Urine: 0 %
Free Kappa Lt Chains,Ur: 3.84 mg/L (ref 1.17–86.46)
Free Kappa/Lambda Ratio: 5.73 (ref 1.83–14.26)
Free Lambda Lt Chains,Ur: <0.67 mg/L (ref 0.27–15.21)
GAMMA GLOBULIN URINE: 0 %
Total Protein, Urine-Ur/day: 110 mg/24 hr (ref 30–150)
Total Protein, Urine: 5 mg/dL
Total Volume: 2200

## 2021-09-30 ENCOUNTER — Other Ambulatory Visit: Payer: Self-pay | Admitting: Rheumatology

## 2021-09-30 DIAGNOSIS — M450 Ankylosing spondylitis of multiple sites in spine: Secondary | ICD-10-CM

## 2021-09-30 MED ORDER — HUMIRA (2 PEN) 40 MG/0.4ML ~~LOC~~ AJKT: 40.0000 mg | AUTO-INJECTOR | SUBCUTANEOUS | 0 refills | Status: DC

## 2021-09-30 NOTE — Telephone Encounter (Signed)
Next Visit: 10/22/2021   Last Visit: 05/21/2021   Last Fill: 07/09/2021   DX:Ankylosing spondylitis of multiple sites in spine    Current Dose per office note 05/21/2021: Humira 40 mg sq injections every 21 days   Labs: 08/12/2021 Glucose 121, Calcium 8.7   TB Gold: 12/18/2020 Neg     Okay to refill Humira?

## 2021-09-30 NOTE — Telephone Encounter (Signed)
Patient left a voicemail requesting a refill of Humira 40mg /0.85ml. Patient states my [facility] needs a new prescription.

## 2021-10-08 NOTE — Progress Notes (Unsigned)
Office Visit Note  Patient: Antonio Ayers             Date of Birth: 11/06/1950           MRN: [id number]             PCP: [name], [name], MD Referring: [name], [place] [facility]* Visit Date: 10/22/2021 Occupation: @GUAROCC @  Subjective:  No chief complaint on file.   History of Present Illness: Antonio Ayers is a 71 y.o. male ***   Activities of Daily Living:  Patient reports morning stiffness for *** {minute/hour:19697}.   Patient {ACTIONS;DENIES/REPORTS:21021675::"Denies"} nocturnal pain.  Difficulty dressing/grooming: {ACTIONS;DENIES/REPORTS:21021675::"Denies"} Difficulty climbing stairs: {ACTIONS;DENIES/REPORTS:21021675::"Denies"} Difficulty getting out of chair: {ACTIONS;DENIES/REPORTS:21021675::"Denies"} Difficulty using hands for taps, buttons, cutlery, and/or writing: {ACTIONS;DENIES/REPORTS:21021675::"Denies"}  No Rheumatology ROS completed.   PMFS History:  Patient Active Problem List   Diagnosis Date Noted   Agatston coronary artery calcium score greater than 400    Aortic aneurysm (HCC)    Aortic valve regurgitation 01/09/2019   Nephrolithiasis 01/09/2019   BPH (benign prostatic hyperplasia) 01/09/2019   Overweight (BMI 25.0-29.9) 01/09/2019   Primary osteoarthritis of both hips 12/29/2018   Primary osteoarthritis of both knees 12/29/2018   Raised intraocular pressure of both eyes 12/19/2018   Ankylosing spondylitis of multiple sites in spine ([place]) 12/19/2018   Hiatal hernia with GERD 11/08/2018   History of colonoscopy 01/07/2018   Gall stone in bile duct with infection of gallbladder 12/14/2017   Hernia of abdominal cavity 12/07/2016   Dislocated elbow, left, initial encounter 10/07/2016   Enlarged liver 12/08/2006   Aortic anomaly 01/07/2005   Fracture 02/17/2004   Keratoconus 12/07/1977    Past Medical History:  Diagnosis Date   Abdominal hernia    Agatston coronary artery calcium score greater than 400    coronary Ca 454 on CT  2021   Ankylosing spondylitis (HCC)    Aortic aneurysm (HCC)    mildly dilated ascending aorta at 41mm and aortic root 62mm by echo 03/2020   Aortic insufficiency    Chronic diarrhea    Colon polyp    Complication of anesthesia    GERD (gastroesophageal reflux disease)    Headache    Heart murmur    History of hiatal hernia    History of kidney stones    PONV (postoperative nausea and vomiting)     Family History  Problem Relation Age of Onset   Stroke Mother    Stroke Father    Heart failure Father    Diabetes Sister    Obesity Sister    Hiatal hernia Sister    Colon cancer Neg Hx    Esophageal cancer Neg Hx    Rectal cancer Neg Hx    Stomach cancer Neg Hx    Past Surgical History:  Procedure Laterality Date   CATARACT EXTRACTION Left 2016   CHOLECYSTECTOMY  01/2018   COLONOSCOPY     02/2015   CORNEAL TRANSPLANT  1989, 1992, 2014   HEMORRHOID SURGERY N/A 03/20/2021   Procedure: HEMORRHOIDECTOMY WITH LIGATION AND HEMORRHOIDOPEXY;  Surgeon: Michael Boston, MD;  Location: [facility];  Service: General;  Laterality: N/A;   HERNIA REPAIR  2018   HIATAL HERNIA REPAIR     KIDNEY STONE SURGERY  03/2018   MASS EXCISION N/A 03/20/2021   Procedure: REMOVAL OF ANAL polyp x2;  Surgeon: Michael Boston, MD;  Location: [facility];  Service: General;  Laterality: N/A;   PROSTATE BIOPSY     x3   RECTAL EXAM  UNDER ANESTHESIA N/A 03/20/2021   Procedure: ANORECTAL EXAM UNDER ANESTHESIA;  Surgeon: Michael Boston, MD;  Location: [facility];  Service: General;  Laterality: N/A;   UPPER GASTROINTESTINAL ENDOSCOPY     Social History   Social History Narrative   Not on file   Immunization History  Administered Date(s) Administered   Fluad Quad(high Dose 65+) 04/30/2019, 05/02/2020, 04/23/2021   Influenza, High Dose Seasonal PF 08/18/2015, 05/05/2016, 06/16/2017, 05/31/2018   PFIZER(Purple Top)SARS-COV-2 Vaccination 09/01/2019, 09/22/2019, 03/30/2020, 09/16/2020, 04/17/2021   Pneumococcal Conjugate-13  08/18/2015, 02/22/2019   Pneumococcal Polysaccharide-23 08/26/2016, 06/24/2020   Tdap 07/09/2014, 06/25/2019   Zoster Recombinat (Shingrix) 02/22/2019, 04/30/2019   Zoster, Live 07/09/2014     Objective: Vital Signs: There were no vitals taken for this visit.   Physical Exam   Musculoskeletal Exam: ***  CDAI Exam: CDAI Score: -- Patient Global: --; Provider Global: -- Swollen: --; Tender: -- Joint Exam 10/22/2021   No joint exam has been documented for this visit   There is currently no information documented on the homunculus. Go to the Rheumatology activity and complete the homunculus joint exam.  Investigation: No additional findings.  Imaging: No results found.  Recent Labs: Lab Results  Component Value Date   WBC 7.8 08/12/2021   HGB 14.4 08/12/2021   PLT 152 08/12/2021   NA 139 08/12/2021   K 4.0 08/12/2021   CL 107 08/12/2021   CO2 25 08/12/2021   GLUCOSE 121 (H) 08/12/2021   BUN 23 08/12/2021   CREATININE 0.80 08/12/2021   BILITOT 2.7 (H) 08/12/2021   ALKPHOS 94 08/12/2021   AST 15 08/12/2021   ALT 15 08/12/2021   PROT 6.6 08/12/2021   ALBUMIN 3.9 08/12/2021   CALCIUM 8.7 (L) 08/12/2021   GFRAA 108 12/18/2020   QFTBGOLDPLUS NEGATIVE 12/18/2020    Speciality Comments: No specialty comments available.  Procedures:  No procedures performed Allergies: Patient has no known allergies.   Assessment / Plan:     Visit Diagnoses: No diagnosis found.  Orders: No orders of the defined types were placed in this encounter.  No orders of the defined types were placed in this encounter.   Face-to-face time spent with patient was *** minutes. Greater than 50% of time was spent in counseling and coordination of care.  Follow-Up Instructions: No follow-ups on file.   Earnestine Mealing, CMA  Note - This record has been created using Editor, commissioning.  Chart creation errors have been sought, but may not always  have been located. Such creation errors do  not reflect on  the standard of medical care.

## 2021-10-20 ENCOUNTER — Telehealth: Payer: Self-pay | Admitting: Pharmacist

## 2021-10-20 ENCOUNTER — Telehealth: Payer: Self-pay

## 2021-10-20 NOTE — Chronic Care Management (AMB) (Signed)
? ? ?Chronic Care Management ?Pharmacy Assistant  ? ?Name: Antonio Ayers  MRN: [id number] DOB: [birth year] ? ?Reason for Encounter: Disease State General Assessment ?  ?Recent office visits:  ?07/08/21 [name], [name], MD - Patient presented for Encounter for preventative health examination and other concerns. No medication changes. ? ?06/12/21 [name], [name], MD - Patient presented for dizziness. Stopped Indapamide. ? ? ? ? ?Recent consult visits:  ?08/19/21 Orson Slick, MD (Oncology) - Patient presented for MGUS and other concerns. No medication changes. ? ?07/30/21 Debbe Odea, PT (PT) - Patient presented for Cervicalgia and other concerns. No medication changes. ? ?07/23/21 Landis Martins, DPM (Podiatry) - Patient presented for Pain due to Onychomycosis of toenails of both feet. No medication changes. ? ?07/06/21 Mertha Finders (PT) - Patient presented for Frequency of micturition and other concerns. No other visit details available. ? ?06/04/21 Jola Schmidt (Ophthalmology) - Patient presented for primary open angle glaucoma bilateral. No other visit details available. ? ?05/21/21 Ofilia Neas, PA-C (Rheumatology) - Patient presented for Ankylosing spondylitis of multiple sites in spine and other concerns. No medication changes. ? ?05/20/21 Irine Seal (Urology) - Patient presented for Frequency of micturition calculus of kidney. No other visit details available. ? ?05/12/21 Gross, Adrian Saran, MD  (Gen Surg) - Patient presented for Prolapsed internal hemorrhoids and other concerns. ? ? ?Hospital visits:  ?None in previous 6 months ? ?Medications: ?Outpatient Encounter Medications as of 10/20/2021  ?Medication Sig  ? acetaminophen (TYLENOL) 325 MG tablet Take 325 mg by mouth every 6 (six) hours as needed for mild pain or moderate pain.  ? Adalimumab (HUMIRA PEN) 40 MG/0.4ML PNKT Inject 40 mg into the skin every 21 ( twenty-one) days.  ? atorvastatin (LIPITOR) 20 MG tablet  Take 1 tablet (20 mg total) by mouth daily.  ? brimonidine (ALPHAGAN) 0.15 % ophthalmic solution 1 drop 2 (two) times daily.  ? brimonidine-timolol (COMBIGAN) 0.2-0.5 % ophthalmic solution Place 1 drop into both eyes 2 (two) times daily.  ? Cholecalciferol 25 MCG (1000 UT) capsule Take 1,000 Units by mouth daily.   ? dorzolamide (TRUSOPT) 2 % ophthalmic solution 1 drop 2 (two) times daily.  ? latanoprost (XALATAN) 0.005 % ophthalmic solution Place 1 drop into both eyes at bedtime.  ? LOTEMAX 0.5 % GEL Place 1 drop into the left eye daily.  ? PFIZER COVID-19 VAC BIVALENT injection   ? polyethylene glycol (MIRALAX / GLYCOLAX) 17 g packet Take 17 g by mouth daily as needed (Constipation).  ? potassium citrate (UROCIT-K) 10 MEQ (1080 MG) SR tablet Take 10 mEq by mouth 3 (three) times daily.  ? psyllium (METAMUCIL SMOOTH TEXTURE) 28 % packet Take 1 packet by mouth 2 (two) times daily.  ? ?No facility-administered encounter medications on file as of 10/20/2021.  ?Notes: ?Patient reports he is doing well. He states he has stopped the Combigan under Eye doctors instructions due to dizziness. He reports he was put on Alphagan and Trusopt instead. He reports one of them is a little pricey about 300.00 for 3 month supply He states his doctor advised him he may not qualify for pt assistance on these. He reports that he also has received his Humira and has been taking it well. He reports no other concerns or issues at this time. ? ?Care Gaps: ?COVID Booster - Overdue ?AWV- 11/22 ?CCM- 9/23 ? ?Star Rating Drugs: ?Atorvastatin (Lipitor) 20 mg- Last filled 06/12/21 90 DS at [facility] ? ? ?Ned Clines CMA ?  Clinical Pharmacist Assistant ?[phone] ? ?

## 2021-10-20 NOTE — Telephone Encounter (Signed)
Patient called requesting a return call to let him know if he needs labwork before his appointment on Thursday, 3/16 with Dr. Estanislado Pandy.   ?

## 2021-10-20 NOTE — Telephone Encounter (Signed)
Patient advised he is not due for labs until April 2023. Patient expressed understanding.  ?

## 2021-10-22 ENCOUNTER — Other Ambulatory Visit: Payer: Self-pay

## 2021-10-22 ENCOUNTER — Ambulatory Visit: Payer: Medicare Other | Admitting: Rheumatology

## 2021-10-22 ENCOUNTER — Encounter: Payer: Self-pay | Admitting: Rheumatology

## 2021-10-22 VITALS — BP 116/78 | HR 91 | Ht 73.0 in | Wt 219.8 lb

## 2021-10-22 DIAGNOSIS — Z79899 Other long term (current) drug therapy: Secondary | ICD-10-CM | POA: Diagnosis not present

## 2021-10-22 DIAGNOSIS — M170 Bilateral primary osteoarthritis of knee: Secondary | ICD-10-CM | POA: Diagnosis not present

## 2021-10-22 DIAGNOSIS — M450 Ankylosing spondylitis of multiple sites in spine: Secondary | ICD-10-CM

## 2021-10-22 DIAGNOSIS — H04123 Dry eye syndrome of bilateral lacrimal glands: Secondary | ICD-10-CM

## 2021-10-22 DIAGNOSIS — M160 Bilateral primary osteoarthritis of hip: Secondary | ICD-10-CM

## 2021-10-22 DIAGNOSIS — D472 Monoclonal gammopathy: Secondary | ICD-10-CM

## 2021-10-22 DIAGNOSIS — R5383 Other fatigue: Secondary | ICD-10-CM | POA: Diagnosis not present

## 2021-10-22 DIAGNOSIS — H40053 Ocular hypertension, bilateral: Secondary | ICD-10-CM

## 2021-10-22 MED ORDER — ATORVASTATIN CALCIUM 20 MG PO TABS: 20.0000 mg | ORAL_TABLET | Freq: Every day | ORAL | 0 refills | Status: DC

## 2021-10-22 NOTE — Patient Instructions (Addendum)
Standing Labs ?We placed an order today for your standing lab work.  ? ?Please have your standing labs drawn in April and every 3 months ? ?If possible, please have your labs drawn 2 weeks prior to your appointment so that the provider can discuss your results at your appointment. ? ?Please note that you may see your imaging and lab results in [place] before we have reviewed them. ?We may be awaiting multiple results to interpret others before contacting you. ?Please allow our office up to 72 hours to thoroughly review all of the results before contacting the office for clarification of your results. ? ?We have open lab daily: ?Monday through Thursday from 1:30-4:30 PM and Friday from 1:30-4:00 PM ?at the office of Dr. Bo Merino, [facility].   ?Please be advised, all patients with office appointments requiring lab work will take precedent over walk-in lab work.  ?If possible, please come for your lab work on Monday and Friday afternoons, as you may experience shorter wait times. ?The office is located at [address] [place], [place], [place] [address] ?No appointment is necessary.   ?Labs are drawn by [facility]. Please bring your co-pay at the time of your lab draw.  You may receive a bill from [facility] for your lab work. ? ?Please note if you are on Hydroxychloroquine and and an order has been placed for a Hydroxychloroquine level, you will need to have it drawn 4 hours or more after your last dose. ? ?If you wish to have your labs drawn at another location, please call the office 24 hours in advance to send orders. ? ?If you have any questions regarding directions or hours of operation,  ?please call [phone].   ?As a reminder, please drink plenty of water prior to coming for your lab work. Thanks!  ? ?If you have signs or symptoms of an infection or start antibiotics: ?First, call your PCP for workup of your infection. ?Hold your medication through the infection, until you complete your  antibiotics, and until symptoms resolve if you take the following: ?Injectable medication (Actemra, Benlysta, Cimzia, Cosentyx, Enbrel, Humira, Kevzara, Orencia, Remicade, Simponi, [place], Dargan, V[place]) ?Methotrexate ?Leflunomide Jolee Ewing) ?Mycophenolate (Cellcept) ?Roma Kayser, or Rinvoq  ? ?Vaccines ?You are taking a medication(s) that can suppress your immune system.  The following immunizations are recommended: ?Flu annually ?Covid-19  ?Td/Tdap (tetanus, diphtheria, pertussis) every 10 years ?Pneumonia (Prevnar 15 then Pneumovax 23 at least 1 year apart.  Alternatively, can take Prevnar 20 without needing additional dose) ?Shingrix: 2 doses from 4 weeks to 6 months apart ? ?Please check with your PCP to make sure you are up to date.  ? ? ?Please get annual skin examination by the dermatologist to screen for skin cancer while you are on Humira. ? ? ? ?IF YOU HAVE A SURGERY SCHEDULED: ?Hold the medication dose that is due BEFORE your surgery if you take the following: ?Injectable medication (Actemra, Benlysta, Cimzia, Cosentyx, Enbrel, Humira, Kevzara, Orencia, Remicade, Rituxan, Simponi, [place], [place], [place]) ?Methotrexate ?Leflunomide Jolee Ewing) ?Mycophenolate (Cellcept) ?Hold 3 days before surgery: Dellie Catholic, [place] ?Hold 1 week before surgery: azathioprine (Imuran), mycophenolate (CellCept), methotrexate ?You do NOT have to hold: hydroxychloroquine (Plaquenil), leflunomide (Arava), sulfasalazine, or Otezla ? ?AFTER YOUR SURGERY, you will need clearance by your SURGEON to resume your medication and have no signs of infection. ?They may send [name] a note or call our clinic to provide clearance.  ?

## 2021-10-22 NOTE — Telephone Encounter (Signed)
Pt's medication was sent to pt's pharmacy as requested. Confirmation received.  °

## 2021-10-29 ENCOUNTER — Encounter: Payer: Self-pay | Admitting: Sports Medicine

## 2021-10-29 ENCOUNTER — Ambulatory Visit: Payer: Medicare Other | Admitting: Sports Medicine

## 2021-10-29 ENCOUNTER — Other Ambulatory Visit: Payer: Self-pay

## 2021-10-29 DIAGNOSIS — M79675 Pain in left toe(s): Secondary | ICD-10-CM | POA: Diagnosis not present

## 2021-10-29 DIAGNOSIS — B351 Tinea unguium: Secondary | ICD-10-CM

## 2021-10-29 DIAGNOSIS — M79674 Pain in right toe(s): Secondary | ICD-10-CM

## 2021-10-29 NOTE — Progress Notes (Signed)
Subjective: ?Antonio Ayers is a 71 y.o. male patient seen today in office with complaint of mildly painful thickened and elongated toenails; unable to trim. Has a history of Anklyosis spondylitis on Humera unchanged from prior. Reports that he will be seeing ortho soon about hip. No other issues noted. ? ?Patient Active Problem List  ? Diagnosis Date Noted  ? Agatston coronary artery calcium score greater than 400   ? Aortic aneurysm ([place])   ? Aortic valve regurgitation 01/09/2019  ? Nephrolithiasis 01/09/2019  ? BPH (benign prostatic hyperplasia) 01/09/2019  ? Overweight (BMI 25.0-29.9) 01/09/2019  ? Primary osteoarthritis of both hips 12/29/2018  ? Primary osteoarthritis of both knees 12/29/2018  ? Raised intraocular pressure of both eyes 12/19/2018  ? Ankylosing spondylitis of multiple sites in spine ([place]) 12/19/2018  ? Hiatal hernia with GERD 11/08/2018  ? History of colonoscopy 01/07/2018  ? Gall stone in bile duct with infection of gallbladder 12/14/2017  ? Hernia of abdominal cavity 12/07/2016  ? Dislocated elbow, left, initial encounter 10/07/2016  ? Enlarged liver 12/08/2006  ? Aortic anomaly 01/07/2005  ? Fracture 02/17/2004  ? 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 ? ?Current Outpatient Medications on File Prior to Visit  ?Medication Sig Dispense Refill  ? acetaminophen (TYLENOL) 325 MG tablet Take 325 mg by mouth every 6 (six) hours as needed for mild pain or moderate pain.    ? Adalimumab (HUMIRA PEN) 40 MG/0.4ML PNKT Inject 40 mg into the skin every 21 ( twenty-one) days. 4 each 0  ? atorvastatin (LIPITOR) 20 MG tablet Take 1 tablet (20 mg total) by mouth daily. 90 tablet 0  ? brimonidine (ALPHAGAN) 0.15 % ophthalmic solution 1 drop 2 (two) times daily.    ? brimonidine-timolol (COMBIGAN) 0.2-0.5 % ophthalmic solution Place 1 drop into both eyes 2 (two) times daily. (Patient not taking: Reported on 10/22/2021)    ? Cholecalciferol 25 MCG (1000 UT) capsule Take 1,000 Units by mouth daily.     ? dorzolamide  (TRUSOPT) 2 % ophthalmic solution 1 drop 2 (two) times daily.    ? latanoprost (XALATAN) 0.005 % ophthalmic solution Place 1 drop into both eyes at bedtime.    ? LOTEMAX 0.5 % GEL Place 1 drop into the left eye daily.    ? PFIZER COVID-19 VAC BIVALENT injection  (Patient not taking: Reported on 10/22/2021)    ? polyethylene glycol (MIRALAX / GLYCOLAX) 17 g packet Take 17 g by mouth daily as needed (Constipation).    ? potassium citrate (UROCIT-K) 10 MEQ (1080 MG) SR tablet Take 10 mEq by mouth 3 (three) times daily.    ? psyllium (METAMUCIL SMOOTH TEXTURE) 28 % packet Take 1 packet by mouth 2 (two) times daily.    ? ?No current facility-administered medications on file prior to visit.  ? ? ?No Known Allergies ? ?Objective: ?Physical Exam ? ?General: Well developed, nourished, no acute distress, awake, alert and oriented x 3 ? ?Vascular: Dorsalis pedis artery 2/4 bilateral, Posterior tibial artery 1/4 bilateral, skin temperature warm to warm proximal to distal bilateral lower extremities, no varicosities, Trace edema bilateral ankles, pedal hair present bilateral. ? ?Neurological: Gross sensation present via light touch bilateral.  ? ?Dermatological: Skin is warm, dry, and supple bilateral, Nails 1-10 are tender, long, thick, and discolored with mild subungal debris, no webspace macerations present bilateral, no open lesions present bilateral, no callus/corns/hyperkeratotic tissue present bilateral. No signs of infection bilateral. ? ?Musculoskeletal:  Asymptomatic hammertoe boney deformities noted bilateral. Muscular strength within normal limits without  painon range of motion. No pain with calf compression bilateral. ? ?Assessment and Plan:  ?Problem List Items Addressed This Visit   ?None ?Visit Diagnoses   ? ? Pain due to onychomycosis of toenails of both feet    -  Primary  ? ?  ? ?-Examined patient.  ?-Re-Discussed treatment options for painful mycotic nails. ?-Mechanically debrided and reduced all painful  mycotic nails with sterile nail nipper and dremel nail file without incident. ?-Patient to return in 3 months for follow up evaluation or sooner if symptoms worsen. ? ?Landis Martins, DPM ? ?

## 2021-11-04 ENCOUNTER — Ambulatory Visit: Payer: Medicare Other | Admitting: Orthopaedic Surgery

## 2021-11-04 ENCOUNTER — Ambulatory Visit (INDEPENDENT_AMBULATORY_CARE_PROVIDER_SITE_OTHER): Payer: Medicare Other

## 2021-11-04 ENCOUNTER — Encounter: Payer: Self-pay | Admitting: Orthopaedic Surgery

## 2021-11-04 ENCOUNTER — Other Ambulatory Visit: Payer: Self-pay

## 2021-11-04 VITALS — Ht 73.0 in | Wt 219.8 lb

## 2021-11-04 DIAGNOSIS — M1611 Unilateral primary osteoarthritis, right hip: Secondary | ICD-10-CM | POA: Diagnosis not present

## 2021-11-04 DIAGNOSIS — M450 Ankylosing spondylitis of multiple sites in spine: Secondary | ICD-10-CM | POA: Diagnosis not present

## 2021-11-04 DIAGNOSIS — M25551 Pain in right hip: Secondary | ICD-10-CM

## 2021-11-04 DIAGNOSIS — M25559 Pain in unspecified hip: Secondary | ICD-10-CM

## 2021-11-04 DIAGNOSIS — M1612 Unilateral primary osteoarthritis, left hip: Secondary | ICD-10-CM

## 2021-11-04 DIAGNOSIS — M25552 Pain in left hip: Secondary | ICD-10-CM | POA: Diagnosis not present

## 2021-11-04 NOTE — Progress Notes (Signed)
Antonio Ayers is a very pleasant 71 year old gentleman who is referred from Dr. Bronson Curb to evaluate and treat severe arthritis in both of his hips.  He walks very slowly and does have a history of ankylosing spondylitis involving mainly his cervical spine.  He does report daily bilateral hip pain and stiffness.  This is decreased his mobility and definitely affected his quality of life and activities day living.  His wife is with him today.  He is a thin individual.  He is not a diabetic. ? ?On exam he has profound stiffness in both hips and significant limitations with any motion of both hips.  He walks with a significantly stiff gait.  He has limited range of motion of the cervical spine as well. ? ?An AP pelvis is obtained and shows severe disease in both hips in terms of the arthritic changes.  He has protrusio of both hips as well and significant malalignment of the hips. ? ?I talked to the patient and his wife about the potential for hip replacement surgery.  His hip disease is quite complicated and I feel that he needs referral to a fellowship trained total joint specialist for this situation given the fact that he may need posterior hip surgery as opposed anterior of surgery and due to the significant protrusio that he has that I am not his experience with taking care of from an anterior approach standpoint.  He agrees with having a referral.  I will send him first to Dr. Rod Can with [facility] and I did give him the name of Dr. Tacey Ruiz with [facility] in [place]. ?

## 2021-11-05 ENCOUNTER — Telehealth: Payer: Self-pay | Admitting: Rheumatology

## 2021-11-05 ENCOUNTER — Encounter: Payer: Self-pay | Admitting: Orthopaedic Surgery

## 2021-11-05 NOTE — Telephone Encounter (Signed)
Left message advised on machine for patient to call back.

## 2021-11-05 NOTE — Telephone Encounter (Signed)
Patient called the office stating Dr. Estanislado Pandy referred him to Dr. Ninfa Linden for a hip replacement. Patient states Dr. Ninfa Linden referred him to another orthopedic surgeon, Dr. [name], and wanted to know Dr. Arlean Hopping thoughts on it and if she thought he would be a good fit for the surgery. ?

## 2021-11-06 NOTE — Telephone Encounter (Signed)
I tried calling patient's several times.  It goes into his answering machine.  Please advise patient that I agree with Dr. Trevor Mace suggestion to either see Dr. [name] or the doctors at [facility].

## 2021-11-06 NOTE — Telephone Encounter (Signed)
Pt called today and states that [facility] dr[facility]SWINTECK, [place] office did not receive pt referral. Can you please call pt when confirmation is received.  ? ?VG [phone]  ? ?Fax [phone] ? ?

## 2021-11-09 NOTE — Telephone Encounter (Signed)
I called patient, patient verbalized understanding. 

## 2021-11-11 ENCOUNTER — Telehealth: Payer: Self-pay | Admitting: Orthopaedic Surgery

## 2021-11-11 ENCOUNTER — Telehealth: Payer: Self-pay | Admitting: Rheumatology

## 2021-11-11 NOTE — Telephone Encounter (Signed)
Patient called the office requesting a copy of his past x-rays for Dr. [name], his orthopedic surgeon.  ?

## 2021-11-11 NOTE — Telephone Encounter (Signed)
Patient called. He would like a copy of his X-rays. His call back number is [phone] ?

## 2021-11-11 NOTE — Telephone Encounter (Signed)
I called patient, CD at front desk for patient to pick up. ?

## 2021-11-11 NOTE — Telephone Encounter (Signed)
Left voicemail advising patient CD was ready for pickup ?

## 2021-11-18 ENCOUNTER — Encounter: Payer: Self-pay | Admitting: Family Medicine

## 2021-11-18 ENCOUNTER — Telehealth (INDEPENDENT_AMBULATORY_CARE_PROVIDER_SITE_OTHER): Payer: Medicare Other | Admitting: Family Medicine

## 2021-11-18 ENCOUNTER — Other Ambulatory Visit: Payer: Self-pay | Admitting: Family Medicine

## 2021-11-18 VITALS — Ht 73.0 in

## 2021-11-18 DIAGNOSIS — R051 Acute cough: Secondary | ICD-10-CM

## 2021-11-18 DIAGNOSIS — J069 Acute upper respiratory infection, unspecified: Secondary | ICD-10-CM | POA: Diagnosis not present

## 2021-11-18 MED ORDER — FLUTICASONE PROPIONATE 50 MCG/ACT NA SUSP: 1.0000 | Freq: Two times a day (BID) | NASAL | 0 refills | Status: DC

## 2021-11-18 MED ORDER — BENZONATATE 100 MG PO CAPS: 100.0000 mg | ORAL_CAPSULE | Freq: Two times a day (BID) | ORAL | 0 refills | Status: AC | PRN

## 2021-11-18 NOTE — Progress Notes (Signed)
Virtual Visit via Video Note ?I connected with Antonio Ayers on 11/18/21 by a video enabled telemedicine application and verified that I am speaking with the correct person using two identifiers. ? Location patient: home ?Location provider:work office ?Persons participating in the virtual visit: patient, provider ? ?I discussed the limitations of evaluation and management by telemedicine and the availability of in person appointments. The patient expressed understanding and agreed to proceed. ? ?Chief Complaint  ?Patient presents with  ? Cough  ? Nasal Congestion  ? ?HPI: ?Antonio Ayers is a 71 yo male with hx of AI,OA,GERD,and aortic aneurysm c/o 7-10 days of cough as described above.  Cough is sometimes productive with "little" clear sputum. ?Negative for hemoptysis.Marland Kitchen ?Cough does not interfere with sleep. ? ?"Little bit " of sore throat and "some" body aches. ?Rhinorrhea and postnasal drainage. ?He feels like cough is deeper and more productive. ? ?He has not performed a COVID 19 test. ? ?Negative for fever,chills,changes in appetite,CP,SOB,wheezing,abdominal pain,N/V,changes in bowel habits, urinary symptoms,or skin rash. ?Negative for heartburn. ? ?Frontal headache and lightheadedness, both he reports having for a while, thought some of his meds could be contributing factors. ?He has tried Robitussin and Vicks. ?No sick contacts or recent travel. ? ?ROS: See pertinent positives and negatives per HPI. ? ?Past Medical History:  ?Diagnosis Date  ? Abdominal hernia   ? Agatston coronary artery calcium score greater than 400   ? coronary Ca 454 on CT 2021  ? Ankylosing spondylitis ([place])   ? Aortic aneurysm ([place])   ? mildly dilated ascending aorta at 78m and aortic root 351mby echo 03/2020  ? Aortic insufficiency   ? Chronic diarrhea   ? Colon polyp   ? Complication of anesthesia   ? GERD (gastroesophageal reflux disease)   ? Headache   ? Heart murmur   ? History of hiatal hernia   ? History of kidney stones   ?  PONV (postoperative nausea and vomiting)   ? ?Past Surgical History:  ?Procedure Laterality Date  ? CATARACT EXTRACTION Left 2016  ? CHOLECYSTECTOMY  01/2018  ? [place]ONOSCOPY    ? 02/2015  [place]  ? HEMORRHOID SURGERY N/A 03/20/2021  ? Procedure: HEMORRHOIDECTOMY WITH LIGATION AND HEMORRHOIDOPEXY;  Surgeon: GrMichael BostonMD;  Location: [facility];  Service: General;  Laterality: N/A;  ? HERNIA REPAIR  2018  ? HIATAL HERNIA REPAIR    ? KIDNEY STONE SURGERY  03/2018  ? MASS EXCISION N/A 03/20/2021  ? Procedure: REMOVAL OF ANAL polyp x2;  Surgeon: GrMichael BostonMD;  Location: [facility];  Service: General;  Laterality: N/A;  ? PROSTATE BIOPSY    ? x3  ? RECTAL EXAM UNDER ANESTHESIA N/A 03/20/2021  ? Procedure: ANORECTAL EXAM UNDER ANESTHESIA;  Surgeon: GrMichael BostonMD;  Location: [facility];  Service: General;  Laterality: N/A;  ? UPPER GASTROINTESTINAL ENDOSCOPY    ? ? ?Family History  ?Problem Relation Age of Onset  ? Stroke Mother   ? Stroke Father   ? Heart failure Father   ? Diabetes Sister   ? Obesity Sister   ? Hiatal hernia Sister   ? Colon cancer Neg Hx   ? Esophageal cancer Neg Hx   ? Rectal cancer Neg Hx   ? Stomach cancer Neg Hx   ? ? ?Social History  ? ?Socioeconomic History  ? Marital status: Single  ?  Spouse name: Not on file  ? Number of children: Not on file  ?  Years of education: Not on file  ? Highest education level: Not on file  ?Occupational History  ? Occupation: retired   ?Tobacco Use  ? Smoking status: Never  ?  Passive exposure: Never  ? Smokeless tobacco: Never  ?Vaping Use  ? Vaping Use: Never used  ?Substance and Sexual Activity  ? Alcohol use: Yes  ?  Comment: Occasionally beer  ? Drug use: Never  ? Sexual activity: Not on file  ?Other Topics Concern  ? Not on file  ?Social History Narrative  ? Not on file  ? ?Social Determinants of Health  ? ?Financial Resource Strain: Not on file  ?Food Insecurity: Not on file  ?Transportation Needs: Not on file  ?Physical Activity:  Not on file  ?Stress: Not on file  ?Social Connections: Not on file  ?Intimate Partner Violence: Not on file  ? ? ?Current Outpatient Medications:  ?  acetaminophen (TYLENOL) 325 MG tablet, Take 325 mg by mouth every 6 (six) hours as needed for mild pain or moderate pain., Disp: , Rfl:  ?  Adalimumab (HUMIRA PEN) 40 MG/0.4ML PNKT, Inject 40 mg into the skin every 21 ( twenty-one) days., Disp: 4 each, Rfl: 0 ?  atorvastatin (LIPITOR) 20 MG tablet, Take 1 tablet (20 mg total) by mouth daily., Disp: 90 tablet, Rfl: 0 ?  brimonidine (ALPHAGAN) 0.15 % ophthalmic solution, 1 drop 2 (two) times daily., Disp: , Rfl:  ?  brimonidine-timolol (COMBIGAN) 0.2-0.5 % ophthalmic solution, Place 1 drop into both eyes 2 (two) times daily., Disp: , Rfl:  ?  Cholecalciferol 25 MCG (1000 UT) capsule, Take 1,000 Units by mouth daily. , Disp: , Rfl:  ?  dorzolamide (TRUSOPT) 2 % ophthalmic solution, 1 drop 2 (two) times daily., Disp: , Rfl:  ?  latanoprost (XALATAN) 0.005 % ophthalmic solution, Place 1 drop into both eyes at bedtime., Disp: , Rfl:  ?  LOTEMAX 0.5 % GEL, Place 1 drop into the left eye daily., Disp: , Rfl:  ?  PFIZER COVID-19 VAC BIVALENT injection, , Disp: , Rfl:  ?  polyethylene glycol (MIRALAX / GLYCOLAX) 17 g packet, Take 17 g by mouth daily as needed (Constipation)., Disp: , Rfl:  ?  potassium citrate (UROCIT-K) 10 MEQ (1080 MG) SR tablet, Take 10 mEq by mouth 3 (three) times daily., Disp: , Rfl:  ?  psyllium (METAMUCIL SMOOTH TEXTURE) 28 % packet, Take 1 packet by mouth 2 (two) times daily., Disp: , Rfl:  ? ?EXAM: ? ?VITALS per patient if applicable:Ht '6\' 1"'$  (1.854 m)   BMI 29.00 kg/m?  ? ?GENERAL: alert, oriented, appears well and in no acute distress ? ?HEENT: atraumatic, conjunctiva clear, no obvious abnormalities on inspection of external nose and ears ?Nasal congestion. ? ?NECK: normal movements of the head and neck ? ?LUNGS: on inspection no signs of respiratory distress, breathing rate appears normal, no  obvious gross SOB, gasping or wheezing ? ?CV: no obvious cyanosis ? ?MS: moves all visible extremities without noticeable abnormality ? ?PSYCH/NEURO: pleasant and cooperative, no obvious depression or anxiety, speech and thought processing grossly intact ? ?ASSESSMENT AND PLAN: ? ?Discussed the following assessment and plan: ? ?URI, acute - Plan: Fluticasone (FLONASE) 50 MCG/ACT nasal spray ?Symptoms suggests a viral etiology, we discussed differential diagnosis, allergies can be a contributing factor. ?I do not think antibiotic treatment is needed at this time. ?Recommend symptomatic treatment. ?Flonase nasal spray daily for 10 to 14 days and then as needed, nasal saline irrigations as needed, and adequate  hydration. ?Instructed to monitor for signs of complications, including new onset of fever among some, clearly instructed about warning signs. ? ?Acute cough - Plan: benzonatate (TESSALON) 100 MG capsule ?I also explained that cough and nasal congestion can last a few days and sometimes weeks. ?Benzonatate recommended to treat cough. ?I do not think imaging is needed at this time but instructed to let me know if cough is not any better in a week, in which case we will bring him to the clinic for chest x-ray. ? ?We discussed possible serious and likely etiologies, options for evaluation and workup, limitations of telemedicine visit vs in person visit, treatment, treatment risks and precautions. ?The patient was advised to call back or seek an in-person evaluation if the symptoms worsen or if the condition fails to improve as anticipated. ?I discussed the assessment and treatment plan with the patient. The patient was provided an opportunity to ask questions and all were answered. The patient agreed with the plan and demonstrated an understanding of the instructions. ? ?Return if symptoms worsen or fail to improve. ? ?Talaysha Freeberg G. Martin[place], MD ? ?[place]. ?[place] office. ? ? ?

## 2021-11-19 ENCOUNTER — Ambulatory Visit: Payer: Medicare Other | Admitting: Family

## 2021-12-04 DIAGNOSIS — H401132 Primary open-angle glaucoma, bilateral, moderate stage: Secondary | ICD-10-CM | POA: Diagnosis not present

## 2021-12-08 ENCOUNTER — Telehealth: Payer: Self-pay | Admitting: Internal Medicine

## 2021-12-08 DIAGNOSIS — M25551 Pain in right hip: Secondary | ICD-10-CM | POA: Diagnosis not present

## 2021-12-08 DIAGNOSIS — M25552 Pain in left hip: Secondary | ICD-10-CM | POA: Diagnosis not present

## 2021-12-08 DIAGNOSIS — M170 Bilateral primary osteoarthritis of knee: Secondary | ICD-10-CM | POA: Diagnosis not present

## 2021-12-08 NOTE — Telephone Encounter (Signed)
Surgical Clearance form to be filled out--placed in dr's folder.  Fax to number on form and call pt for pick up. ?

## 2021-12-09 NOTE — Telephone Encounter (Signed)
Form placed in Dr Hernandez's folder. 

## 2021-12-10 ENCOUNTER — Telehealth: Payer: Self-pay

## 2021-12-10 ENCOUNTER — Other Ambulatory Visit: Payer: Self-pay

## 2021-12-10 DIAGNOSIS — Z79899 Other long term (current) drug therapy: Secondary | ICD-10-CM

## 2021-12-10 DIAGNOSIS — M450 Ankylosing spondylitis of multiple sites in spine: Secondary | ICD-10-CM

## 2021-12-10 DIAGNOSIS — R778 Other specified abnormalities of plasma proteins: Secondary | ICD-10-CM

## 2021-12-10 MED ORDER — HUMIRA (2 PEN) 40 MG/0.4ML ~~LOC~~ AJKT: 40.0000 mg | AUTO-INJECTOR | SUBCUTANEOUS | 0 refills | Status: DC

## 2021-12-10 NOTE — Telephone Encounter (Signed)
Called the patient to schedule him an appointment for clearance. He states he only wants to see Dr Heron Nay, offer sooner appt with APP. The patient declined and said he will follow up as scheduled in June for clearance.  ? ?Left message on requesting providers voicemail making them aware. Advised a call back with any questions or concerns.  ?

## 2021-12-10 NOTE — Telephone Encounter (Signed)
? ?  Pre-operative Risk Assessment  ?  ?Patient Name: Antonio Ayers  ?DOB: 1951/05/31 ?MRN: [id number]  ? ?  ? ?Request for Surgical Clearance   ? ?Procedure:   Left Total Hip Arthroplasty ? ?Date of Surgery:  Clearance TBD                              ?   ?Surgeon:  Dr Rod Can  ?Surgeon's Group or Practice Name:  [facility] ?Phone number:  [phone] [place] ?Fax number:  [phone] ?  ?Type of Clearance Requested:   ?- Medical  ?  ?Type of Anesthesia:  Spinal ?  ?Additional requests/questions:   n/a ? ?Signed, ?Ulice Brilliant T   ?12/10/2021, 7:42 AM  ?

## 2021-12-10 NOTE — Telephone Encounter (Signed)
Next Visit: 03/24/2022 ? ?Last Visit: 10/22/2021 ? ?Last Fill: 09/30/2021 ? ?VZ:[id number] spondylitis of multiple sites in spine ? ?Current Dose per office note 10/22/2021: Humira 40 mg sq injections every 21 days. ? ?Labs: 08/12/2021, IgA 462, Kappa free light chain 25.2, Glucose 121, Calcium 8.7, Total Bilirubin 2.7, I called patient, patient will have labs drawn 12/11/2021 ? ?TB Gold: 12/18/2020 negative  ? ?Okay to refill Humira? ? ?

## 2021-12-10 NOTE — Telephone Encounter (Signed)
Patient called requesting prescription refill of Humira to be sent to [facility].   

## 2021-12-10 NOTE — Telephone Encounter (Signed)
? ?  Name: Tae Vonada  ?DOB: [birth year]  ?MRN: [id number] ? ?Primary Cardiologist: [name], MD ? ?Chart reviewed as part of pre-operative protocol coverage. Because of Jarmar Slocumb's past medical history and time since last visit, he will require a follow-up in-office visit in order to better assess preoperative cardiovascular risk. ? ?Pre-op covering staff: ?- Please schedule appointment and call patient to inform them. If patient already had an upcoming appointment within acceptable timeframe, please add "pre-op clearance" to the appointment notes so provider is aware. ?- Please contact requesting surgeon's office via preferred method (i.e, phone, fax) to inform them of need for appointment prior to surgery. ? ? ? ?Mable Fill, Marissa Nestle, NP  ?12/10/2021, 8:06 AM   ?

## 2021-12-11 NOTE — Telephone Encounter (Signed)
Pt on line requesting forms be faxed to [facility] patient does not have fax number.  ?

## 2021-12-14 NOTE — Telephone Encounter (Signed)
Faxed and confirmed

## 2021-12-15 ENCOUNTER — Ambulatory Visit (INDEPENDENT_AMBULATORY_CARE_PROVIDER_SITE_OTHER): Payer: Medicare Other | Admitting: Internal Medicine

## 2021-12-15 ENCOUNTER — Encounter: Payer: Self-pay | Admitting: Internal Medicine

## 2021-12-15 VITALS — BP 110/74 | HR 87 | Temp 95.0°F | Ht 73.0 in | Wt 216.1 lb

## 2021-12-15 DIAGNOSIS — M160 Bilateral primary osteoarthritis of hip: Secondary | ICD-10-CM

## 2021-12-15 DIAGNOSIS — J029 Acute pharyngitis, unspecified: Secondary | ICD-10-CM

## 2021-12-15 LAB — POC COVID19 BINAXNOW: SARS Coronavirus 2 Ag: NEGATIVE

## 2021-12-15 LAB — POCT RAPID STREP A (OFFICE): Rapid Strep A Screen: NEGATIVE

## 2021-12-15 MED ORDER — BENZONATATE 100 MG PO CAPS: 100.0000 mg | ORAL_CAPSULE | Freq: Two times a day (BID) | ORAL | 0 refills | Status: DC | PRN

## 2021-12-15 NOTE — Progress Notes (Signed)
? ? ? ?Established Patient Office Visit ? ? ? ? ?CC/Reason for Visit: Discuss acute concerns ? ?HPI: Antonio Ayers is a 71 y.o. male who is coming in today for the above mentioned reasons.  2 weeks ago he was dealing with a URI.  Most of his symptoms have resolved although he continues to have postnasal drip and cough as well as mild congestion.  He is requesting a refill of Tessalon Perles.  He was also told recently by his orthopedist that he will require bilateral hip replacement for his hip osteoarthritis and wants to know my opinion about having these surgeries. ? ?Past Medical/Surgical History: ?Past Medical History:  ?Diagnosis Date  ? Abdominal hernia   ? Agatston coronary artery calcium score greater than 400   ? coronary Ca 454 on CT 2021  ? Ankylosing spondylitis ([place])   ? Aortic aneurysm ([place])   ? mildly dilated ascending aorta at 71m and aortic root 390mby echo 03/2020  ? Aortic insufficiency   ? Chronic diarrhea   ? Colon polyp   ? Complication of anesthesia   ? GERD (gastroesophageal reflux disease)   ? Headache   ? Heart murmur   ? History of hiatal hernia   ? History of kidney stones   ? PONV (postoperative nausea and vomiting)   ? ? ?Past Surgical History:  ?Procedure Laterality Date  ? CATARACT EXTRACTION Left 2016  ? CHOLECYSTECTOMY  01/2018  ? COLONOSCOPY    ? 02/2015  ? [id number]  ? HEMORRHOID SURGERY N/A 03/20/2021  ? Procedure: HEMORRHOIDECTOMY WITH LIGATION AND HEMORRHOIDOPEXY;  Surgeon: GrMichael BostonMD;  [facility];  Service: General;  Laterality: N/A;  ? HERNIA REPAIR  2018  ? HIATAL HERNIA REPAIR    ? KIDNEY STONE SURGERY  03/2018  ? MASS EXCISION N/A 03/20/2021  ? Procedure: REMOVAL OF ANAL polyp x2;  Surgeon: GrMichael BostonMD;  Location: [facility];  Service: General;  Laterality: N/A;  ? PROSTATE BIOPSY    ? x3  ? RECTAL EXAM UNDER ANESTHESIA N/A 03/20/2021  ? Procedure: ANORECTAL EXAM UNDER ANESTHESIA;  Surgeon: GrMichael BostonMD;  Location: [facility];   Service: General;  Laterality: N/A;  ? UPPER GASTROINTESTINAL ENDOSCOPY    ? ? ?Social History: ? reports that he has never smoked. He has never been exposed to tobacco smoke. He has never used smokeless tobacco. He reports current alcohol use. He reports that he does not use drugs. ? ?Allergies: ?No Known Allergies ? ?Family History:  ?Family History  ?Problem Relation Age of Onset  ? Stroke Mother   ? Stroke Father   ? Heart failure Father   ? Diabetes Sister   ? Obesity Sister   ? Hiatal hernia Sister   ? Colon cancer Neg Hx   ? Esophageal cancer Neg Hx   ? Rectal cancer Neg Hx   ? Stomach cancer Neg Hx   ? ? ? ?Current Outpatient Medications:  ?  acetaminophen (TYLENOL) 325 MG tablet, Take 325 mg by mouth every 6 (six) hours as needed for mild pain or moderate pain., Disp: , Rfl:  ?  Adalimumab (HUMIRA PEN) 40 MG/0.4ML PNKT, Inject 40 mg into the skin every 21 ( twenty-one) days., Disp: 4 each, Rfl: 0 ?  atorvastatin (LIPITOR) 20 MG tablet, Take 1 tablet (20 mg total) by mouth daily., Disp: 90 tablet, Rfl: 0 ?  benzonatate (TESSALON) 100 MG capsule, Take 1 capsule (100 mg total) by mouth  2 (two) times daily as needed for cough., Disp: 20 capsule, Rfl: 0 ?  brimonidine (ALPHAGAN) 0.15 % ophthalmic solution, 1 drop 2 (two) times daily., Disp: , Rfl:  ?  Cholecalciferol 25 MCG (1000 UT) capsule, Take 1,000 Units by mouth daily. , Disp: , Rfl:  ?  dorzolamide (TRUSOPT) 2 % ophthalmic solution, 1 drop 2 (two) times daily., Disp: , Rfl:  ?  fluticasone (FLONASE) 50 MCG/ACT nasal spray, SHAKE LIQUID AND USE 1 SPRAY IN EACH NOSTRIL TWICE DAILY, Disp: 48 g, Rfl: 0 ?  latanoprost (XALATAN) 0.005 % ophthalmic solution, Place 1 drop into both eyes at bedtime., Disp: , Rfl:  ?  LOTEMAX 0.5 % GEL, Place 1 drop into the left eye daily., Disp: , Rfl:  ?  PFIZER COVID-19 VAC BIVALENT injection, , Disp: , Rfl:  ?  polyethylene glycol (MIRALAX / GLYCOLAX) 17 g packet, Take 17 g by mouth daily as needed (Constipation)., Disp: ,  Rfl:  ?  potassium citrate (UROCIT-K) 10 MEQ (1080 MG) SR tablet, Take 10 mEq by mouth 3 (three) times daily., Disp: , Rfl:  ?  psyllium (METAMUCIL SMOOTH TEXTURE) 28 % packet, Take 1 packet by mouth 2 (two) times daily., Disp: , Rfl:  ? ?Review of Systems:  ?Constitutional: Denies fever, chills, diaphoresis, appetite change and fatigue.  ?HEENT: Denies photophobia, eye pain, redness, mouth sores, trouble swallowing, neck pain, neck stiffness and tinnitus.   ?Respiratory: Denies SOB, DOE, cough, chest tightness,  and wheezing.   ?Cardiovascular: Denies chest pain, palpitations and leg swelling.  ?Gastrointestinal: Denies nausea, vomiting, abdominal pain, diarrhea, constipation, blood in stool and abdominal distention.  ?Genitourinary: Denies dysuria, urgency, frequency, hematuria, flank pain and difficulty urinating.  ?Endocrine: Denies: hot or cold intolerance, sweats, changes in hair or nails, polyuria, polydipsia. ?Musculoskeletal: Positive for myalgias, back pain, joint swelling, arthralgias and gait problem.  ?Skin: Denies pallor, rash and wound.  ?Neurological: Denies dizziness, seizures, syncope, weakness, light-headedness, numbness and headaches.  ?Hematological: Denies adenopathy. Easy bruising, personal or family bleeding history  ?Psychiatric/Behavioral: Denies suicidal ideation, mood changes, confusion, nervousness, sleep disturbance and agitation ? ? ? ?Physical Exam: ?Vitals:  ? 12/15/21 1322  ?BP: 110/74  ?Pulse: 87  ?Temp: (!) 95 ?F (35 ?C)  ?TempSrc: Oral  ?SpO2: 95%  ?Weight: 216 lb 1.6 oz (98 kg)  ?Height: '6\' 1"'$  (1.854 m)  ? ? ?Body mass index is 28.51 kg/m?. ? ? ?Constitutional: NAD, calm, comfortable ?Eyes: PERRL, lids and conjunctivae normal ?ENMT: Mucous membranes are moist. Posterior pharynx is erythematous but clear of any exudate or lesions. Normal dentition. Tympanic membrane is pearly white, no erythema or bulging. ?Respiratory: clear to auscultation bilaterally, no wheezing, no crackles.  Normal respiratory effort. No accessory muscle use.  ?Cardiovascular: Regular rate and rhythm, no murmurs / rubs / gallops. No extremity edema.  ?Psychiatric: Normal judgment and insight. Alert and oriented x 3. Normal mood.  ? ? ?Impression and Plan: ? ?Sore throat  ?- Plan: POC COVID-19, POC Rapid Strep A, benzonatate (TESSALON) 100 MG capsule ?-COVID, strep test are negative. ?-Refill Tessalon Perles, advised to take antihistamine and guaifenesin. ? ?Primary osteoarthritis of both hips ?-Have advised that given his significant disability from hip osteoarthritis including pain and lack of mobility, I would follow the recommendation of his orthopedic surgeon. ? ? ? ?Time spent:20 minutes reviewing chart, interviewing and examining patient and formulating plan of care. ? ? ? ? ?Lelon Frohlich, MD ?[facility] ? ? ?

## 2021-12-16 ENCOUNTER — Other Ambulatory Visit: Payer: Self-pay | Admitting: *Deleted

## 2021-12-16 DIAGNOSIS — M450 Ankylosing spondylitis of multiple sites in spine: Secondary | ICD-10-CM | POA: Diagnosis not present

## 2021-12-16 DIAGNOSIS — Z79899 Other long term (current) drug therapy: Secondary | ICD-10-CM

## 2021-12-16 DIAGNOSIS — R778 Other specified abnormalities of plasma proteins: Secondary | ICD-10-CM | POA: Diagnosis not present

## 2021-12-17 NOTE — Progress Notes (Signed)
? ?Office Visit Note ? ?Patient: Antonio Ayers             ?Date of Birth: Sep 29, 1950           ?MRN: [id number]             ?PCP: [name], [name], MD ?Referring: [name], [place]* ?Visit Date: 12/21/2021 ?Occupation: '@GUAROCC'$ @ ? ?Subjective:  ?Pain in both hips. ? ?History of Present Illness: Antonio Ayers is a 71 y.o. male with history of ankylosing spondylitis and osteoarthritis.  He states he has been having increasing stiffness and pain recently.  He describes pain in his neck, thoracic spine, hips and his knee joints.  He believes that Humira is not effective since he has been taking it every 21 days.  He has been on Humira every 21 days since March 2022.  He denies any history of swelling.  He saw Dr. Ninfa Linden for evaluation of bilateral total hip replacement.  Dr. Ninfa Linden referred him to Dr. [name].  Patient had not 1 appointment with Dr. [name].  He is not satisfied with answers he received so far as regards to rehab.  He has several concerns.  His bedroom is upstairs in the shower is upstairs. ? ?Activities of Daily Living:  ?Patient reports morning stiffness for a few hours.   ?Patient Reports nocturnal pain.  ?Difficulty dressing/grooming: Reports ?Difficulty climbing stairs: Reports ?Difficulty getting out of chair: Reports ?Difficulty using hands for taps, buttons, cutlery, and/or writing: Denies ? ?Review of Systems  ?Constitutional:  Positive for fatigue.  ?HENT:  Positive for mouth dryness and nose dryness. Negative for mouth sores.   ?Eyes:  Positive for dryness. Negative for pain and itching.  ?Respiratory:  Negative for difficulty breathing.   ?Cardiovascular:  Negative for chest pain and palpitations.  ?Gastrointestinal:  Positive for constipation and diarrhea. Negative for blood in stool.  ?Endocrine: Negative for increased urination.  ?Genitourinary:  Negative for difficulty urinating.  ?Musculoskeletal:  Positive for joint pain, joint pain, myalgias, morning  stiffness, muscle tenderness and myalgias. Negative for joint swelling.  ?Skin:  Negative for color change, rash, redness and sensitivity to sunlight.  ?Allergic/Immunologic: Negative for susceptible to infections.  ?Neurological:  Positive for dizziness, numbness and weakness. Negative for headaches.  ?Hematological:  Negative for bruising/bleeding tendency.  ?Psychiatric/Behavioral:  Positive for self-injury. Negative for depressed mood, homicidal ideas and confusion.   ? ?PMFS History:  ?Patient Active Problem List  ? Diagnosis Date Noted  ? Agatston coronary artery calcium score greater than 400   ? Aortic aneurysm ([place])   ? Aortic valve regurgitation 01/09/2019  ? Nephrolithiasis 01/09/2019  ? BPH (benign prostatic hyperplasia) 01/09/2019  ? Overweight (BMI 25.0-29.9) 01/09/2019  ? Primary osteoarthritis of both hips 12/29/2018  ? Primary osteoarthritis of both knees 12/29/2018  ? Raised intraocular pressure of both eyes 12/19/2018  ? Ankylosing spondylitis of multiple sites in spine ([place]) 12/19/2018  ? Hiatal hernia with GERD 11/08/2018  ? History of colonoscopy 01/07/2018  ? Gall stone in bile duct with infection of gallbladder 12/14/2017  ? Hernia of abdominal cavity 12/07/2016  ? Dislocated elbow, left, initial encounter 10/07/2016  ? Enlarged liver 12/08/2006  ? Aortic anomaly 01/07/2005  ? Fracture 02/17/2004  ? 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  ?Past Medical History:  ?Diagnosis Date  ? Abdominal hernia   ? Agatston coronary artery calcium score greater than 400   ? coronary Ca 454 on CT 2021  ? Ankylosing spondylitis ([place])   ? Aortic aneurysm ([place])   ?  mildly dilated ascending aorta at 68m and aortic root 393mby echo 03/2020  ? Aortic insufficiency   ? Chronic diarrhea   ? Colon polyp   ? Complication of anesthesia   ? GERD (gastroesophageal reflux disease)   ? Headache   ? Heart murmur   ? History of hiatal hernia   ? History of kidney stones   ? PONV (postoperative nausea and vomiting)   ?  ?Family History   ?Problem Relation Age of Onset  ? Stroke Mother   ? Stroke Father   ? Heart failure Father   ? Diabetes Sister   ? Obesity Sister   ? Hiatal hernia Sister   ? Colon cancer Neg Hx   ? Esophageal cancer Neg Hx   ? Rectal cancer Neg Hx   ? Stomach cancer Neg Hx   ? ?Past Surgical History:  ?Procedure Laterality Date  ? CATARACT EXTRACTION Left 2016  ? CHOLECYSTECTOMY  01/2018  ? COLONOSCOPY    ? 02/2015  ? [id number]  ? HEMORRHOID SURGERY N/A 03/20/2021  ? Procedure: HEMORRHOIDECTOMY WITH LIGATION AND HEMORRHOIDOPEXY;  Surgeon: GrMichael BostonMD;  Location: [facility];  Service: General;  Laterality: N/A;  ? HERNIA REPAIR  2018  ? HIATAL HERNIA REPAIR    ? KIDNEY STONE SURGERY  03/2018  ? MASS EXCISION N/A 03/20/2021  ? Procedure: REMOVAL OF ANAL polyp x2;  Surgeon: GrMichael BostonMD;  Location: [facility];  Service: General;  Laterality: N/A;  ? PROSTATE BIOPSY    ? x3  ? RECTAL EXAM UNDER ANESTHESIA N/A 03/20/2021  ? Procedure: ANORECTAL EXAM UNDER ANESTHESIA;  Surgeon: GrMichael BostonMD;  Location: [facility];  Service: General;  Laterality: N/A;  ? UPPER GASTROINTESTINAL ENDOSCOPY    ? ?Social History  ? ?Social History Narrative  ? Not on file  ? ?Immunization History  ?Administered Date(s) Administered  ? Fluad Quad(high Dose 65+) 04/30/2019, 05/02/2020, 04/23/2021  ? Influenza, High Dose Seasonal PF 08/18/2015, 05/05/2016, 06/16/2017, 05/31/2018  ? PFIZER(Purple Top)SARS-COV-2 Vaccination 09/01/2019, 09/22/2019, 03/30/2020, 09/16/2020, 04/17/2021  ? Pneumococcal Conjugate-13 08/18/2015, 02/22/2019  ? Pneumococcal Polysaccharide-23 08/26/2016, 06/24/2020  ? Tdap 07/09/2014, 06/25/2019  ? Zoster Recombinat (Shingrix) 02/22/2019, 04/30/2019  ? Zoster, Live 07/09/2014  ?  ? ?Objective: ?Vital Signs: BP 105/69 (BP Location: Left Arm, Patient Position: Sitting, Cuff Size: Normal)   Pulse 94   Ht '6\' 1"'$  (1.854 m)   Wt 215 lb 9.6 oz (97.8 kg)   BMI 28.44 kg/m?   ? ?Physical Exam ?Vitals and nursing note  reviewed.  ?Constitutional:   ?   Appearance: He is well-developed.  ?HENT:  ?   Head: Normocephalic and atraumatic.  ?Eyes:  ?   Conjunctiva/sclera: Conjunctivae normal.  ?   Pupils: Pupils are equal, round, and reactive to light.  ?Cardiovascular:  ?   Rate and Rhythm: Normal rate and regular rhythm.  ?   Heart sounds: Normal heart sounds.  ?Pulmonary:  ?   Effort: Pulmonary effort is normal.  ?   Breath sounds: Normal breath sounds.  ?Abdominal:  ?   General: Bowel sounds are normal.  ?   Palpations: Abdomen is soft.  ?Musculoskeletal:  ?   Cervical back: Normal range of motion and neck supple.  ?Skin: ?   General: Skin is warm and dry.  ?   Capillary Refill: Capillary refill takes less than 2 seconds.  ?Neurological:  ?   Mental Status: He is alert and oriented to person, place,  and time.  ?Psychiatric:     ?   Behavior: Behavior normal.  ?  ? ?Musculoskeletal Exam: He had limited lateral rotation, flexion and extension range of motion of the cervical spine.  He had limited range of motion of the thoracic and lumbar spine.  Thoracic kyphosis was present.  He had no point tenderness.  He had no tenderness over SI joints.  Shoulder joint abduction was limited to 110 degrees.  Elbow joints with good range of motion.  Wrist joints, MCPs PIPs and DIPs with good range of motion with no synovitis.  He had bilateral DIP thickening.  He had very limited flexion or abduction in his hip joints.  Knee joints had limited extension without any warmth swelling or effusion.  He had no tenderness over ankles or MTPs. ? ?CDAI Exam: ?CDAI Score: -- ?Patient Global: --; Provider Global: -- ?Swollen: --; Tender: -- ?Joint Exam 12/21/2021  ? ?No joint exam has been documented for this visit  ? ?There is currently no information documented on the homunculus. Go to the Rheumatology activity and complete the homunculus joint exam. ? ?Investigation: ?No additional findings. ? ?Imaging: ?No results found. ? ?Recent Labs: ?Lab Results   ?Component Value Date  ? WBC 5.3 12/16/2021  ? HGB 14.3 12/16/2021  ? PLT 161 12/16/2021  ? NA 140 12/16/2021  ? K 4.0 12/16/2021  ? CL 105 12/16/2021  ? CO2 27 12/16/2021  ? GLUCOSE 122 (H) 12/16/2021  ? BUN 18 0

## 2021-12-17 NOTE — Progress Notes (Signed)
CBC WNL.  Glucose is 122.  Total protein is borderline low.  Bilirubin is borderline elevated but has trended down.  Rest of CMP WNL.

## 2021-12-19 LAB — CBC WITH DIFFERENTIAL/PLATELET
Absolute Monocytes: 413 cells/uL (ref 200–950)
Basophils Absolute: 32 cells/uL (ref 0–200)
Basophils Relative: 0.6 %
Eosinophils Absolute: 32 cells/uL (ref 15–500)
Eosinophils Relative: 0.6 %
HCT: 42.5 % (ref 38.5–50.0)
Hemoglobin: 14.3 g/dL (ref 13.2–17.1)
Lymphs Abs: 938 cells/uL (ref 850–3900)
MCH: 31.7 pg (ref 27.0–33.0)
MCHC: 33.6 g/dL (ref 32.0–36.0)
MCV: 94.2 fL (ref 80.0–100.0)
MPV: 9.5 fL (ref 7.5–12.5)
Monocytes Relative: 7.8 %
Neutro Abs: 3885 cells/uL (ref 1500–7800)
Neutrophils Relative %: 73.3 %
Platelets: 161 10*3/uL (ref 140–400)
RBC: 4.51 10*6/uL (ref 4.20–5.80)
RDW: 13.1 % (ref 11.0–15.0)
Total Lymphocyte: 17.7 %
WBC: 5.3 10*3/uL (ref 3.8–10.8)

## 2021-12-19 LAB — COMPLETE METABOLIC PANEL WITH GFR
AG Ratio: 1.6 (calc) (ref 1.0–2.5)
ALT: 12 U/L (ref 9–46)
AST: 14 U/L (ref 10–35)
Albumin: 3.7 g/dL (ref 3.6–5.1)
Alkaline phosphatase (APISO): 86 U/L (ref 35–144)
BUN: 18 mg/dL (ref 7–25)
CO2: 27 mmol/L (ref 20–32)
Calcium: 8.9 mg/dL (ref 8.6–10.3)
Chloride: 105 mmol/L (ref 98–110)
Creat: 0.71 mg/dL (ref 0.70–1.28)
Globulin: 2.3 g/dL (calc) (ref 1.9–3.7)
Glucose, Bld: 122 mg/dL — ABNORMAL HIGH (ref 65–99)
Potassium: 4 mmol/L (ref 3.5–5.3)
Sodium: 140 mmol/L (ref 135–146)
Total Bilirubin: 1.6 mg/dL — ABNORMAL HIGH (ref 0.2–1.2)
Total Protein: 6 g/dL — ABNORMAL LOW (ref 6.1–8.1)
eGFR: 98 mL/min/{1.73_m2} (ref >=60)

## 2021-12-19 LAB — QUANTIFERON-TB GOLD PLUS
Mitogen-NIL: 9.81 IU/mL
NIL: 0.02 IU/mL
QuantiFERON-TB Gold Plus: NEGATIVE
TB1-NIL: 0 IU/mL
TB2-NIL: 0.01 IU/mL

## 2021-12-20 NOTE — Progress Notes (Signed)
TB Gold negative

## 2021-12-21 ENCOUNTER — Ambulatory Visit: Payer: Medicare Other | Admitting: Rheumatology

## 2021-12-21 ENCOUNTER — Encounter: Payer: Self-pay | Admitting: Rheumatology

## 2021-12-21 ENCOUNTER — Encounter: Payer: Self-pay | Admitting: Orthopaedic Surgery

## 2021-12-21 ENCOUNTER — Other Ambulatory Visit: Payer: Self-pay

## 2021-12-21 ENCOUNTER — Telehealth: Payer: Self-pay | Admitting: Orthopaedic Surgery

## 2021-12-21 VITALS — BP 105/69 | HR 94 | Ht 73.0 in | Wt 215.6 lb

## 2021-12-21 DIAGNOSIS — M1611 Unilateral primary osteoarthritis, right hip: Secondary | ICD-10-CM

## 2021-12-21 DIAGNOSIS — R5383 Other fatigue: Secondary | ICD-10-CM

## 2021-12-21 DIAGNOSIS — D472 Monoclonal gammopathy: Secondary | ICD-10-CM

## 2021-12-21 DIAGNOSIS — H40053 Ocular hypertension, bilateral: Secondary | ICD-10-CM

## 2021-12-21 DIAGNOSIS — M160 Bilateral primary osteoarthritis of hip: Secondary | ICD-10-CM

## 2021-12-21 DIAGNOSIS — H04123 Dry eye syndrome of bilateral lacrimal glands: Secondary | ICD-10-CM

## 2021-12-21 DIAGNOSIS — D801 Nonfamilial hypogammaglobulinemia: Secondary | ICD-10-CM

## 2021-12-21 DIAGNOSIS — M450 Ankylosing spondylitis of multiple sites in spine: Secondary | ICD-10-CM

## 2021-12-21 DIAGNOSIS — M170 Bilateral primary osteoarthritis of knee: Secondary | ICD-10-CM | POA: Diagnosis not present

## 2021-12-21 DIAGNOSIS — Z79899 Other long term (current) drug therapy: Secondary | ICD-10-CM | POA: Diagnosis not present

## 2021-12-21 DIAGNOSIS — M1612 Unilateral primary osteoarthritis, left hip: Secondary | ICD-10-CM

## 2021-12-21 MED ORDER — HUMIRA (2 PEN) 40 MG/0.4ML ~~LOC~~ AJKT: 40.0000 mg | AUTO-INJECTOR | SUBCUTANEOUS | 0 refills | Status: AC

## 2021-12-21 NOTE — Patient Instructions (Addendum)
Standing Labs ?We placed an order today for your standing lab work.  ? ?Please have your standing labs drawn in August and every 3 months ? ?If possible, please have your labs drawn 2 weeks prior to your appointment so that the provider can discuss your results at your appointment. ? ?Please note that you may see your imaging and lab results in [facility] before we have reviewed them. ?We may be awaiting multiple results to interpret others before contacting you. ?Please allow our office up to 72 hours to thoroughly review all of the results before contacting the office for clarification of your results. ? ?We have open lab daily: ?Monday through Thursday from 1:30-4:30 PM and Friday from 1:30-4:00 PM ?at the office of Dr. Bo Merino, [facility].   ?Please be advised, all patients with office appointments requiring lab work will take precedent over walk-in lab work.  ?If possible, please come for your lab work on Monday and Friday afternoons, as you may experience shorter wait times. ?The office is located at [address] [place], [place], [place] [address] ?No appointment is necessary.   ?Labs are drawn by [facility]. Please bring your co-pay at the time of your lab draw.  You may receive a bill from [facility] for your lab work. ? ?Please note if you are on Hydroxychloroquine and and an order has been placed for a Hydroxychloroquine level, you will need to have it drawn 4 hours or more after your last dose. ? ?If you wish to have your labs drawn at another location, please call the office 24 hours in advance to send orders. ? ?If you have any questions regarding directions or hours of operation,  ?please call [phone].   ?As a reminder, please drink plenty of water prior to coming for your lab work. Thanks!  ? ?Vaccines ?You are taking a medication(s) that can suppress your immune system.  The following immunizations are recommended: ?Flu annually ?Covid-19  ?Td/Tdap (tetanus, diphtheria,  pertussis) every 10 years ?Pneumonia (Prevnar 15 then Pneumovax 23 at least 1 year apart.  Alternatively, can take Prevnar 20 without needing additional dose) ?Shingrix: 2 doses from 4 weeks to 6 months apart ? ?Please check with your PCP to make sure you are up to date.  ? ?If you have signs or symptoms of an infection or start antibiotics: ?First, call your PCP for workup of your infection. ?Hold your medication through the infection, until you complete your antibiotics, and until symptoms resolve if you take the following: ?Injectable medication (Actemra, Benlysta, Cimzia, Cosentyx, Enbrel, Humira, Kevzara, Orencia, Remicade, Simponi, [place], [place], [place]) ?Methotrexate ?Leflunomide Jolee Ewing) ?Mycophenolate (Cellcept) ?Roma Kayser, or Rinvoq  ?Please get annual skin examination to screen for skin cancer while you are on Humira. ?

## 2021-12-21 NOTE — Telephone Encounter (Signed)
error 

## 2021-12-30 ENCOUNTER — Telehealth: Payer: Self-pay | Admitting: Internal Medicine

## 2021-12-30 NOTE — Telephone Encounter (Signed)
Pt is ware he is good on his PNA vaccine and has to wait in the fall for the flu vaccine.

## 2021-12-30 NOTE — Telephone Encounter (Signed)
Left message for patient to call back  

## 2021-12-30 NOTE — Telephone Encounter (Signed)
Pt is returning karpuih call 

## 2021-12-30 NOTE — Telephone Encounter (Signed)
Pt is calling and would like to know if he needs another PNA vaccine. Pt also want to get flu shot

## 2021-12-30 NOTE — Telephone Encounter (Signed)
Returning a call. Left message for patient to call back.

## 2022-01-08 ENCOUNTER — Ambulatory Visit (INDEPENDENT_AMBULATORY_CARE_PROVIDER_SITE_OTHER): Payer: Medicare Other | Admitting: Cardiology

## 2022-01-08 ENCOUNTER — Encounter: Payer: Self-pay | Admitting: Cardiology

## 2022-01-08 ENCOUNTER — Ambulatory Visit: Payer: Medicare Other | Admitting: Cardiology

## 2022-01-08 VITALS — BP 82/60 | HR 83 | Ht 73.0 in | Wt 211.6 lb

## 2022-01-08 DIAGNOSIS — R55 Syncope and collapse: Secondary | ICD-10-CM | POA: Diagnosis not present

## 2022-01-08 DIAGNOSIS — E7800 Pure hypercholesterolemia, unspecified: Secondary | ICD-10-CM

## 2022-01-08 DIAGNOSIS — I351 Nonrheumatic aortic (valve) insufficiency: Secondary | ICD-10-CM

## 2022-01-08 DIAGNOSIS — I77819 Aortic ectasia, unspecified site: Secondary | ICD-10-CM

## 2022-01-08 DIAGNOSIS — I2510 Atherosclerotic heart disease of native coronary artery without angina pectoris: Secondary | ICD-10-CM | POA: Diagnosis not present

## 2022-01-08 DIAGNOSIS — R42 Dizziness and giddiness: Secondary | ICD-10-CM | POA: Diagnosis not present

## 2022-01-08 NOTE — Patient Instructions (Addendum)
Medication Instructions:  Your physician recommends that you continue on your current medications as directed. Please refer to the Current Medication list given to you today.  *If you need a refill on your cardiac medications before your next appointment, please call your pharmacy*   Lab Work: TODAY: urinalysis with specific gravity WITHIN THE NEXT WEEK at 8 am : Cortisol level   If you have labs (blood work) drawn today and your tests are completely normal, you will receive your results only by: [facility] (if you have MyChart) OR A paper copy in the mail If you have any lab test that is abnormal or we need to change your treatment, we will call you to review the results.   Testing/Procedures: Your physician has requested that you have an echocardiogram. Echocardiography is a painless test that uses sound waves to create images of your heart. It provides your doctor with information about the size and shape of your heart and how well your heart's chambers and valves are working. This procedure takes approximately one hour. There are no restrictions for this procedure.    Follow-Up: At [facility], you and your health needs are our priority.  As part of our continuing mission to provide you with exceptional heart care, we have created designated Provider Care Teams.  These Care Teams include your primary Cardiologist (physician) and Advanced Practice Providers (APPs -  Physician Assistants and Nurse Practitioners) who all work together to provide you with the care you need, when you need it.  We recommend signing up for the patient portal called "MyChart".  Sign up information is provided on this After Visit Summary.  MyChart is used to connect with patients for Virtual Visits (Telemedicine).  Patients are able to view lab/test results, encounter notes, upcoming appointments, etc.  Non-urgent messages can be sent to your provider as well.   To learn more about what you can do with  MyChart, go to [email or web address].    Your next appointment:   2 week(s)  The format for your next appointment:   In Person  Provider:   Melina Copa, PA-C    Other Instructions You were given a prescription for thigh high compression socks please obtain them and put on every morning and take off at night  Important Information About [place]

## 2022-01-08 NOTE — Progress Notes (Unsigned)
Date:  01/08/2022   ID:  Antonio Ayers, DOB 07-Jul-1951, MRN [id number]  PCP:  [name], [name], MD  Cardiologist:  [name], MD Electrophysiologist:  None   Chief Complaint:  Coronary Ca, preop clearance, dizziness, AI  History of Present Illness:    Antonio Ayers is a 71 y.o. male with a hx of ankylosing spondylitis followed by Rheum, BPH, glaucoma and AI followed with serial echo.  He was found to have a heart murmur in 2002 and a 2D echo confirmed aortic insufficiency.  He has been followed with yearly echo   His last echo in 03/2020 showed trivial AI and mildly dilated ascending aorta at 60m with normal LVF and mild LVH with G1DD. Chest CTA showed mildly dilated sinus of Valsalva at 318m  There were also coronary artery calcifications in the LAD.  He underwent coronary Ca score which was elevated at 454 and aortic atherosclerosis but no aneurysm.  He is here today for followup and is doing well.  He has chronic DOE that is mild and stable and unchanged from when I saw him last.  He has chronic LE edema when he has an arthritic flare.  He denies any chest pain or pressure, PND, orthopnea, palpitations or syncope. Recently he has had some dizziness when up walking and has been very sluggish.   Prior CV studies:   The following studies were reviewed today:  2D echo 04/2021 IMPRESSIONS   1. Left ventricular ejection fraction, by estimation, is 55 to 60%. The  left ventricle has normal function. Left ventricular diastolic parameters  are consistent with Grade I diastolic dysfunction (impaired relaxation).   2. Peak RV-RA gradient 19 mmHg. The IVC was not visualized. Right  ventricular systolic function is normal. The right ventricular size is  normal.   3. The mitral valve is normal in structure. No evidence of mitral valve  regurgitation. No evidence of mitral stenosis.   4. The aortic valve is tricuspid. Aortic valve regurgitation is trivial.  No aortic  stenosis is present.   Past Medical History:  Diagnosis Date   Abdominal hernia    Agatston coronary artery calcium score greater than 400    coronary Ca 454 on CT 2021   Ankylosing spondylitis ([facility])    Aortic aneurysm ([facility])    mildly dilated ascending aorta at 3870mnd aortic root 51m29m echo 03/2020   Aortic insufficiency    Chronic diarrhea    Colon polyp    Complication of anesthesia    GERD (gastroesophageal reflux disease)    Headache    Heart murmur    History of hiatal hernia    History of kidney stones    PONV (postoperative nausea and vomiting)    Past Surgical History:  Procedure Laterality Date   CATARACT EXTRACTION Left 2016   CHOLECYSTECTOMY  01/2018   COLONOSCOPY     02/2015   CORNEAL TRANSPLANT  1989, 1992, 2014   HEMORRHOID SURGERY N/A 03/20/2021   Procedure: HEMORRHOIDECTOMY WITH LIGATION AND HEMORRHOIDOPEXY;  Surgeon: GrosMichael Boston;  Location: [facility];  Service: General;  Laterality: N/A;   HERNIA REPAIR  2018   HIATAL HERNIA REPAIR     KIDNEY STONE SURGERY  03/2018   MASS EXCISION N/A 03/20/2021   Procedure: REMOVAL OF ANAL polyp x2;  Surgeon: GrosMichael Boston;  Location: [facility];  Service: General;  Laterality: N/A;   PROSTATE BIOPSY     x3   RECTAL EXAM UNDER ANESTHESIA N/A  03/20/2021   Procedure: ANORECTAL EXAM UNDER ANESTHESIA;  Surgeon: Michael Boston, MD;  Location: [facility];  Service: General;  Laterality: N/A;   UPPER GASTROINTESTINAL ENDOSCOPY       Current Meds  Medication Sig   acetaminophen (TYLENOL) 325 MG tablet Take 325 mg by mouth every 6 (six) hours as needed for mild pain or moderate pain.   Adalimumab (HUMIRA PEN) 40 MG/0.4ML PNKT Inject 40 mg into the skin every 14 (fourteen) days.   atorvastatin (LIPITOR) 20 MG tablet Take 1 tablet (20 mg total) by mouth daily.   brimonidine (ALPHAGAN) 0.15 % ophthalmic solution 1 drop 2 (two) times daily.   Cholecalciferol 25 MCG (1000 UT) capsule Take 1,000 Units by mouth daily.    dorzolamide  (TRUSOPT) 2 % ophthalmic solution 1 drop 2 (two) times daily.   fluticasone (FLONASE) 50 MCG/ACT nasal spray SHAKE LIQUID AND USE 1 SPRAY IN EACH NOSTRIL TWICE DAILY   latanoprost (XALATAN) 0.005 % ophthalmic solution Place 1 drop into both eyes at bedtime.   LOTEMAX 0.5 % GEL Place 1 drop into the left eye daily.   PFIZER COVID-19 VAC BIVALENT injection    polyethylene glycol (MIRALAX / GLYCOLAX) 17 g packet Take 17 g by mouth daily as needed (Constipation).   potassium citrate (UROCIT-K) 10 MEQ (1080 MG) SR tablet Take 10 mEq by mouth 3 (three) times daily.   psyllium (METAMUCIL SMOOTH TEXTURE) 28 % packet Take 1 packet by mouth 2 (two) times daily.     Allergies:   Patient has no known allergies.   Social History   Tobacco Use   Smoking status: Never    Passive exposure: Never   Smokeless tobacco: Never  Vaping Use   Vaping Use: Never used  Substance Use Topics   Alcohol use: Yes    Comment: Occasionally beer   Drug use: Never     Family Hx: The patient's family history includes Diabetes in his sister; Heart failure in his father; Hiatal hernia in his sister; Obesity in his sister; Stroke in his father and mother. There is no history of Colon cancer, Esophageal cancer, Rectal cancer, or Stomach cancer.  ROS:   Please see the history of present illness.     All other systems reviewed and are negative.   Labs/Other Tests and Data Reviewed:    Recent Labs: 07/08/2021: TSH 1.37 12/16/2021: ALT 12; BUN 18; Creat 0.71; Hemoglobin 14.3; Platelets 161; Potassium 4.0; Sodium 140   Recent Lipid Panel Lab Results  Component Value Date/Time   CHOL 106 07/08/2021 09:51 AM   CHOL 107 10/21/2020 08:50 AM   TRIG 64.0 07/08/2021 09:51 AM   HDL 56.70 07/08/2021 09:51 AM   HDL 53 10/21/2020 08:50 AM   CHOLHDL 2 07/08/2021 09:51 AM   LDLCALC 36 07/08/2021 09:51 AM   LDLCALC 39 10/21/2020 08:50 AM    Wt Readings from Last 3 Encounters:  01/08/22 211 lb 9.6 oz (96 kg)  12/21/21  215 lb 9.6 oz (97.8 kg)  12/15/21 216 lb 1.6 oz (98 kg)     Objective:    Vital Signs:  BP (!) 82/60   Pulse 83   Ht '6\' 1"'$  (1.854 m)   Wt 211 lb 9.6 oz (96 kg)   SpO2 95%   BMI 27.92 kg/m    GEN: Well nourished, well developed in no acute distress HEENT: Normal NECK: No JVD; No carotid bruits LYMPHATICS: No lymphadenopathy CARDIAC:RRR, no murmurs, rubs, gallops RESPIRATORY:  Clear to auscultation without rales,  wheezing or rhonchi  ABDOMEN: Soft, non-tender, non-distended MUSCULOSKELETAL:  No edema; No deformity  SKIN: Warm and dry NEUROLOGIC:  Alert and oriented x 3 PSYCHIATRIC:  Normal affect    EKG in office today showed NSR with LVH by voltage with inferior T wave abnormality.   TWI in aVF is new ASSESSMENT & PLAN:    1.  Aortic Insufficiency -2D echo 04/2021 with trivial AI  2.  Mildly dilated ascending aorta -measuring 3.9cm on Chest CTA 04/2019 and normal on Chest CT Ca score 03/2020 -and 3.9cm on 2D echo 03/2020 and normal on echo 03/2021 -BP controlled  3.  Coronary artery calcifications -coronary calcium score elevated at 454 -Nuclear stress test 12/2020 showed no ischemia -He denies any anginal symptoms -Continue atorvastatin 20 mg daily  -Recommend starting a baby aspirin 81 mg daily  4.  HLD -LDL goal < 70 -I have personally reviewed and interpreted outside labs performed by patient's PCP which showed LDL 36, HDL 56, triglycerides 64 07/08/2021 -Continue prescription drug management with atorvastatin 20 mg daily with as needed refills  5.  Hypotension -unclear etiology but SBP 23mHg today  -he tries to stay hydrated and drinks at least 50oz of water daily -Repeat automatic blood pressure still showed a systolic blood pressure in the 80s -I have recommended we send him to the emergency room as he is not on any medication that would cause low blood pressure and has been dizzy and sluggish>> patient did not really want to go to the emergency room but agreed.   Upon arrival of EMS and after giving some fluids orally repeat blood pressure was 140/100 mmHg. -He tells me has been having these episodes where his blood pressure will be elevated and then it is low.  Suspect he has orthostatic hypotension -At this time no indication for sending to the emergency room -I have given him a prescription for thigh-high compression hose that I have instructed him to to put on every morning and take off at bedtime. -I have encouraged him to drink at least 64 ounces of fluid daily and avoid caffeine and alcohol -He will let me know if he has any further dizzy spells or any syncopal episodes -I will have him come in for an 8 AM cortisol level to rule out adrenal insufficiency -Repeat 2D echo to make sure LV function is stable -Check urinalysis to assess specific gravity to help determine volume status -He will follow-up with DSharrell Ku PA in 2 weeks to reassess and hopefully will be stable for preoperative clearance  6.  Preoperative cardiac clearance /abnormal EKG -new T wave inversion in aVF -he has not had any anginal symptoms but has had significant weakness and dizziness with sluggishness lately and now hypotensive in the office -He is supposed to be getting cardiac clearance for hip and knee surgery but this time cannot clear him due to hypotension of unknown etiology  I have spent a total of 45 minutes with patient reviewing labs, EKGs and examining patient as well as establishing an assessment and plan that was discussed with the patient and EMS.  > 50% of time was spent in direct patient care.     Medication Adjustments/Labs and Tests Ordered: Current medicines are reviewed at length with the patient today.  Concerns regarding medicines are outlined above.  Tests Ordered: Orders Placed This Encounter  Procedures   EKG 12-Lead   Medication Changes: No orders of the defined types were placed in this encounter.   Disposition:  Follow up with PA in 2 weeks  to see how he was doing and hopefully can get cleared for his surgery  Signed, [name], MD  01/08/2022 2:50 PM    [place]

## 2022-01-09 LAB — URINALYSIS, ROUTINE W REFLEX MICROSCOPIC
Bilirubin, UA: NEGATIVE
Glucose, UA: NEGATIVE
Ketones, UA: NEGATIVE
Leukocytes,UA: NEGATIVE
Nitrite, UA: NEGATIVE
Protein,UA: NEGATIVE
RBC, UA: NEGATIVE
Specific Gravity, UA: 1.021 (ref 1.005–1.030)
Urobilinogen, Ur: 1 mg/dL (ref 0.2–1.0)
pH, UA: 5.5 (ref 5.0–7.5)

## 2022-01-11 ENCOUNTER — Other Ambulatory Visit: Payer: Medicare Other

## 2022-01-11 DIAGNOSIS — I351 Nonrheumatic aortic (valve) insufficiency: Secondary | ICD-10-CM

## 2022-01-11 DIAGNOSIS — I77819 Aortic ectasia, unspecified site: Secondary | ICD-10-CM | POA: Diagnosis not present

## 2022-01-11 DIAGNOSIS — I2510 Atherosclerotic heart disease of native coronary artery without angina pectoris: Secondary | ICD-10-CM | POA: Diagnosis not present

## 2022-01-11 DIAGNOSIS — M1612 Unilateral primary osteoarthritis, left hip: Secondary | ICD-10-CM | POA: Diagnosis not present

## 2022-01-11 DIAGNOSIS — E7800 Pure hypercholesterolemia, unspecified: Secondary | ICD-10-CM | POA: Diagnosis not present

## 2022-01-12 LAB — CORTISOL: Cortisol: 11.1 ug/dL (ref 6.2–19.4)

## 2022-01-14 ENCOUNTER — Other Ambulatory Visit: Payer: Medicare Other

## 2022-01-18 ENCOUNTER — Ambulatory Visit: Payer: Medicare Other | Admitting: Podiatry

## 2022-01-18 ENCOUNTER — Encounter: Payer: Self-pay | Admitting: Podiatry

## 2022-01-18 DIAGNOSIS — M79675 Pain in left toe(s): Secondary | ICD-10-CM | POA: Diagnosis not present

## 2022-01-18 DIAGNOSIS — M79674 Pain in right toe(s): Secondary | ICD-10-CM | POA: Diagnosis not present

## 2022-01-18 DIAGNOSIS — B351 Tinea unguium: Secondary | ICD-10-CM

## 2022-01-20 ENCOUNTER — Ambulatory Visit: Payer: Medicare Other | Admitting: Physician Assistant

## 2022-01-20 ENCOUNTER — Encounter: Payer: Self-pay | Admitting: Physician Assistant

## 2022-01-20 VITALS — BP 100/60 | HR 93 | Ht 73.0 in | Wt 214.0 lb

## 2022-01-20 DIAGNOSIS — I77819 Aortic ectasia, unspecified site: Secondary | ICD-10-CM | POA: Insufficient documentation

## 2022-01-20 DIAGNOSIS — I9589 Other hypotension: Secondary | ICD-10-CM

## 2022-01-20 DIAGNOSIS — Z01818 Encounter for other preprocedural examination: Secondary | ICD-10-CM | POA: Insufficient documentation

## 2022-01-20 DIAGNOSIS — R0609 Other forms of dyspnea: Secondary | ICD-10-CM | POA: Diagnosis not present

## 2022-01-20 DIAGNOSIS — I351 Nonrheumatic aortic (valve) insufficiency: Secondary | ICD-10-CM | POA: Diagnosis not present

## 2022-01-20 DIAGNOSIS — R931 Abnormal findings on diagnostic imaging of heart and coronary circulation: Secondary | ICD-10-CM

## 2022-01-20 DIAGNOSIS — E7800 Pure hypercholesterolemia, unspecified: Secondary | ICD-10-CM | POA: Insufficient documentation

## 2022-01-20 DIAGNOSIS — I959 Hypotension, unspecified: Secondary | ICD-10-CM | POA: Insufficient documentation

## 2022-01-20 NOTE — Patient Instructions (Signed)
Medication Instructions:   Your physician recommends that you continue on your current medications as directed. Please refer to the Current Medication list given to you today.   *If you need a refill on your cardiac medications before your next appointment, please call your pharmacy*   Lab Work:  None ordered.  If you have labs (blood work) drawn today and your tests are completely normal, you will receive your results only by: [place] (if you have MyChart) OR A paper copy in the mail If you have any lab test that is abnormal or we need to change your treatment, we will call you to review the results.   Testing/Procedures:   You are scheduled for a Myocardial Perfusion Imaging Study on Friday, June 23 at  10:15 am.   Please arrive 15 minutes prior to your appointment time for registration and insurance purposes.   The test will take approximately 3 to 4 hours to complete; you may bring reading material. If someone comes with you to your appointment, they will need to remain in the main lobby due to limited space in the testing area.   How to prepare for your Myocardial Perfusion test:   Do not eat or drink 3 hours prior to your test, except you may have water.    Do not consume products containing caffeine (regular or decaffeinated) 12 hours prior to your test (ex: coffee, chocolate, soda, tea)   Do bring a list of your current medications with you. If not listed below, you may take your medications as normal.   Bring any held medication to your appointment, as you may be required to take it once the test is complete.   Do wear comfortable clothes (no  overalls) and walking shoes. Tennis shoes are preferred. No open toed shoes.  Do not wear cologne, aftershave or lotions (deodorant is allowed).   If these instructions are not followed, you test will have to be rescheduled.   Please report to [address] for your test. If you have questions or  concerns about your appointment, please call the Nuclear Lab at [phone].  If you cannot keep your appointment, please provide 24 hour notification to the Nuclear lab to avoid a possible $50 charge to your account.       Follow-Up: At [facility], you and your health needs are our priority.  As part of our continuing mission to provide you with exceptional heart care, we have created designated Provider Care Teams.  These Care Teams include your primary Cardiologist (physician) and Advanced Practice Providers (APPs -  Physician Assistants and Nurse Practitioners) who all work together to provide you with the care you need, when you need it.  We recommend signing up for the patient portal called "MyChart".  Sign up information is provided on this After Visit Summary.  MyChart is used to connect with patients for Virtual Visits (Telemedicine).  Patients are able to view lab/test results, encounter notes, upcoming appointments, etc.  Non-urgent messages can be sent to your provider as well.   To learn more about what you can do with MyChart, go to [email or web address].    Your next appointment:   3 month(s)  The format for your next appointment:   In Person  Provider:   [name], MD     Other Instructions  Push fluids as much as possible.  Increase salt in diet.  Increase protein in diet.  Important Information About Sugar

## 2022-01-20 NOTE — Assessment & Plan Note (Signed)
Continue atorvastatin 20 mg daily.  LDL in November 2022 was 36.

## 2022-01-20 NOTE — Assessment & Plan Note (Signed)
He was recently seen by Dr. Radford Pax and was noted to have symptomatic hypotension.  Work-up so far has been unrevealing.  He has not yet had his echocardiogram.  This is scheduled next week.  He does note exertional shortness of breath.  He also notes some shoulder discomfort with exertion.  It is difficult to know if this is all related to his arthritis or if he has an anginal equivalent.  His electrocardiogram 2 weeks ago did demonstrate inferior Q waves.  This was not present on the electrocardiogram prior to this.  He does have coronary calcification on CT scan.  I have recommended proceeding with a Lexiscan Myoview as well before we completely cleared him for surgery.

## 2022-01-20 NOTE — Assessment & Plan Note (Signed)
39 mm on echocardiogram in 2021

## 2022-01-20 NOTE — Assessment & Plan Note (Signed)
Trivial by most recent echocardiogram.  Repeat echocardiogram is pending.

## 2022-01-20 NOTE — Assessment & Plan Note (Signed)
Repeat blood pressure by me was 94/70 on the right and 92/68 on the left.  He notes a long history of low blood pressures.  Previously followed in [place] by cardiology.  He has fairly chronic dizziness.  Recent a.m. cortisol was normal.  Specific gravity on urinalysis was normal.  Recent hemoglobin was normal.  He is unable to get compression hose on given his ankylosing spondylitis.  I have encouraged him to push fluids and increase salt in his diet.  He has also had a low protein level in the past.  I have asked him to increase his dietary protein as well.  I will set him up for a 37-monthfollow-up.  If he continues to have low blood pressures with symptoms, consider adding Midodrine.

## 2022-01-20 NOTE — Progress Notes (Signed)
Cardiology Office Note:    Date:  01/20/2022   ID:  Antonio Ayers, DOB [birth year], MRN [id number]  PCP:  [name], [name], MD  [place] Cardiologist:  [name], MD    Referring MD: [name], Estel*   Chief Complaint:  Surgical clearance    Patient Profile: Aortic insufficiency Mildly dilated ascending aorta Coronary artery calcifications CAC score in 2021: 454 Myoview 12/2020: Low risk Ankylosing spondylitis BPH Glaucoma GERD is not  Prior CV Studies: Limited echocardiogram 04/20/2021 EF 55-60, GR 1 DD, normal RVSF, trivial AI  Myoview 12/31/2020 EF 54, normal perfusion, low risk  Echocardiogram 03/17/2020 EF 60-65, no RWMA, mild LVH, GR 1 DD, normal RVSF, normal PASP, trivial AI, mild AV sclerosis without stenosis, ascending aorta 39 mm (mildly dilated), RVSP 16.1  CAC score 03/17/2020 CAC score 454 (71st percentile) Borderline dilated ascending aorta (37 mm  Chest/aorta CTA 04/11/2019 Ascending thoracic aorta 3.3 x 3.2 cm Sinus of Valsalva 3.9 cm CAD  Echocardiogram 02/23/2019 EF >65, normal RVSF, trivial AI  History of Present Illness:   Antonio Ayers is a 71 y.o. male with the above problem list.  He was last seen by Dr. Radford Pax 01/08/2022.  His appt was for surgical clearance.  He needs L THR with Dr. [name] under spinal anesthesia.  At his visit, his systolic blood pressure was in the 80s and the patient was somewhat symptomatic.  He is not currently on antihypertensives.  He was given oral fluids and EMS was called to take him to the emergency room.  Upon EMS arrival, his blood pressure was up to 140/100.  He noted episodes of blood pressure fluctuation.  Dr. Radford Pax recommended thigh-high compression stockings and adequate hydration.  An a.m. cortisol level was obtained and this was normal.  Urinalysis demonstrated normal specific gravity.  Echocardiogram has been ordered but is not scheduled until next week.      He is here  alone.  He is limited in mobility secondary to ankylosing spondylitis as well as hip arthritis.  He has a lot of hip and back pain throughout the day.  He has not really had chest discomfort.  However, he does have exertional shoulder and neck discomfort.  He is not certain if this is related to arthritis or not.  He does note shortness of breath with minimal activity.  He can probably achieve around 4 METS.  He has not had orthopnea.  He does have mild ankle edema.  This is unchanged.  He has not had syncope.  He does have issues with balance and has fallen in the past.  He has had issues with low blood pressure in the past.  He was followed by cardiology in [place].  He notes his pressures have typically been in the 90s to 100s.  He continues to have issues with lightheadedness.  This does get worse with standing    Past Medical History:  Diagnosis Date   Abdominal hernia    Agatston coronary artery calcium score greater than 400    coronary Ca 454 on CT 2021   Ankylosing spondylitis (HCC)    Aortic aneurysm (HCC)    mildly dilated ascending aorta at 50m and aortic root 348mby echo 03/2020   Aortic insufficiency    Chronic diarrhea    Colon polyp    Complication of anesthesia    GERD (gastroesophageal reflux disease)    Headache    Heart murmur    History of hiatal hernia  History of kidney stones    PONV (postoperative nausea and vomiting)    Current Medications: Current Meds  Medication Sig   acetaminophen (TYLENOL) 325 MG tablet Take 325 mg by mouth every 6 (six) hours as needed for mild pain or moderate pain.   Adalimumab (HUMIRA PEN) 40 MG/0.4ML PNKT Inject 40 mg into the skin every 14 (fourteen) days.   atorvastatin (LIPITOR) 20 MG tablet Take 1 tablet (20 mg total) by mouth daily.   benzonatate (TESSALON) 100 MG capsule benzonatate 100 mg capsule   brimonidine (ALPHAGAN) 0.15 % ophthalmic solution 1 drop 2 (two) times daily.   Cholecalciferol 25 MCG (1000 UT) capsule Take  1,000 Units by mouth daily.    dorzolamide (TRUSOPT) 2 % ophthalmic solution 1 drop 2 (two) times daily.   fluticasone (FLONASE) 50 MCG/ACT nasal spray SHAKE LIQUID AND USE 1 SPRAY IN EACH NOSTRIL TWICE DAILY   latanoprost (XALATAN) 0.005 % ophthalmic solution Place 1 drop into both eyes at bedtime.   LOTEMAX 0.5 % GEL Place 1 drop into the left eye daily.   PFIZER COVID-19 VAC BIVALENT injection    polyethylene glycol (MIRALAX / GLYCOLAX) 17 g packet Take 17 g by mouth daily as needed (Constipation).   potassium citrate (UROCIT-K) 10 MEQ (1080 MG) SR tablet Take 10 mEq by mouth 3 (three) times daily.   psyllium (METAMUCIL SMOOTH TEXTURE) 28 % packet Take 1 packet by mouth 2 (two) times daily.    Allergies:   Patient has no known allergies.   Social History   Tobacco Use   Smoking status: Never    Passive exposure: Never   Smokeless tobacco: Never  Vaping Use   Vaping Use: Never used  Substance Use Topics   Alcohol use: Yes    Comment: Occasionally beer   Drug use: Never    Family Hx: The patient's family history includes Diabetes in his sister; Heart failure in his father; Hiatal hernia in his sister; Obesity in his sister; Stroke in his father and mother. There is no history of Colon cancer, Esophageal cancer, Rectal cancer, or Stomach cancer.  Review of Systems  Constitutional: Negative for fever.  Respiratory:  Negative for cough.   Gastrointestinal:  Negative for hematochezia.  Genitourinary:  Negative for hematuria.     EKGs/Labs/Other Test Reviewed:    EKG:  EKG is not ordered today.  The ekg ordered today demonstrates n/a  Recent Labs: 07/08/2021: TSH 1.37 12/16/2021: ALT 12; BUN 18; Creat 0.71; Hemoglobin 14.3; Platelets 161; Potassium 4.0; Sodium 140   Recent Lipid Panel Recent Labs    07/08/21 0951  CHOL 106  TRIG 64.0  HDL 56.70  VLDL 12.8  LDLCALC 36     Risk Assessment/Calculations/Metrics:              Physical Exam:    VS:  BP 100/60 (BP  Location: Left Arm, Patient Position: Sitting, Cuff Size: Normal)   Pulse 93   Ht '6\' 1"'$  (1.854 m)   Wt 214 lb (97.1 kg)   SpO2 95%   BMI 28.23 kg/m     Wt Readings from Last 3 Encounters:  01/20/22 214 lb (97.1 kg)  01/08/22 211 lb 9.6 oz (96 kg)  12/21/21 215 lb 9.6 oz (97.8 kg)    Constitutional:      Appearance: Healthy appearance. Not in distress.  Pulmonary:     Effort: Pulmonary effort is normal.     Breath sounds: No wheezing. No rales.  Cardiovascular:  Normal rate. Regular rhythm. Normal S1. Normal S2.      Murmurs: There is no murmur.  Edema:    Peripheral edema present.    Ankle: bilateral trace edema of the ankle. Abdominal:     Palpations: Abdomen is soft.  Musculoskeletal:     Cervical back: Neck supple.     Comments: Limited trunk mobility due to ankylosing spondylitis  Skin:    General: Skin is warm and dry.  Neurological:     General: No focal deficit present.     Mental Status: Alert and oriented to person, place and time.         ASSESSMENT & PLAN:   Preoperative clearance He was recently seen by Dr. Radford Pax and was noted to have symptomatic hypotension.  Work-up so far has been unrevealing.  He has not yet had his echocardiogram.  This is scheduled next week.  He does note exertional shortness of breath.  He also notes some shoulder discomfort with exertion.  It is difficult to know if this is all related to his arthritis or if he has an anginal equivalent.  His electrocardiogram 2 weeks ago did demonstrate inferior Q waves.  This was not present on the electrocardiogram prior to this.  He does have coronary calcification on CT scan.  I have recommended proceeding with a Lexiscan Myoview as well before we completely cleared him for surgery.  Arterial hypotension Repeat blood pressure by me was 94/70 on the right and 92/68 on the left.  He notes a long history of low blood pressures.  Previously followed in [place] by cardiology.  He has fairly chronic  dizziness.  Recent a.m. cortisol was normal.  Specific gravity on urinalysis was normal.  Recent hemoglobin was normal.  He is unable to get compression hose on given his ankylosing spondylitis.  I have encouraged him to push fluids and increase salt in his diet.  He has also had a low protein level in the past.  I have asked him to increase his dietary protein as well.  I will set him up for a 61-monthfollow-up.  If he continues to have low blood pressures with symptoms, consider adding Midodrine.  Acquired dilation of ascending aorta and aortic root (HCC) 39 mm on echocardiogram in 2021  Pure hypercholesterolemia Continue atorvastatin 20 mg daily.  LDL in November 2022 was 36.  Agatston coronary artery calcium score greater than 400 Myoview in 2022 was low risk.  As noted, he does have exertional shortness of breath.  This appears to be fairly new for him.  Arrange Lexiscan Myoview as noted.  Echocardiogram is currently pending.  If his echocardiogram and nuclear stress test are unremarkable, I suspect we can clear him for surgery.  Nonrheumatic aortic valve insufficiency Trivial by most recent echocardiogram.  Repeat echocardiogram is pending.        Shared Decision Making/Informed Consent The risks [chest pain, shortness of breath, cardiac arrhythmias, dizziness, blood pressure fluctuations, myocardial infarction, stroke/transient ischemic attack, nausea, vomiting, allergic reaction, radiation exposure, metallic taste sensation and life-threatening complications (estimated to be 1 in 10,000)], benefits (risk stratification, diagnosing coronary artery disease, treatment guidance) and alternatives of a nuclear stress test were discussed in detail with Mr. CBeboutand he agrees to proceed.   Dispo:  Return in about 3 months (around 04/22/2022) for Routine Follow Up with Dr. TRadford Pax   Medication Adjustments/Labs and Tests Ordered: Current medicines are reviewed at length with the patient today.   Concerns regarding medicines are  outlined above.  Tests Ordered: Orders Placed This Encounter  Procedures   Cardiac Stress Test: Informed Consent Details: Physician/Practitioner Attestation; Transcribe to consent form and obtain patient signature   Myocardial Perfusion Imaging   Medication Changes: No orders of the defined types were placed in this encounter.  Signed, Richardson Dopp, PA-C  01/20/2022 2:45 PM    [facility] [address], [place], [place]  [address] Phone: [phone]; Fax: [phone]

## 2022-01-20 NOTE — Assessment & Plan Note (Signed)
Myoview in 2022 was low risk.  As noted, he does have exertional shortness of breath.  This appears to be fairly new for him.  Arrange Lexiscan Myoview as noted.  Echocardiogram is currently pending.  If his echocardiogram and nuclear stress test are unremarkable, I suspect we can clear him for surgery.

## 2022-01-22 ENCOUNTER — Telehealth (HOSPITAL_COMMUNITY): Payer: Self-pay | Admitting: *Deleted

## 2022-01-22 DIAGNOSIS — R350 Frequency of micturition: Secondary | ICD-10-CM | POA: Diagnosis not present

## 2022-01-22 DIAGNOSIS — M6281 Muscle weakness (generalized): Secondary | ICD-10-CM | POA: Diagnosis not present

## 2022-01-22 DIAGNOSIS — M6289 Other specified disorders of muscle: Secondary | ICD-10-CM | POA: Diagnosis not present

## 2022-01-22 DIAGNOSIS — M62838 Other muscle spasm: Secondary | ICD-10-CM | POA: Diagnosis not present

## 2022-01-22 NOTE — Telephone Encounter (Signed)
Left message on voicemail per DPR in reference to upcoming appointment scheduled on 01/29/2022 at 10:15 with detailed instructions given per Myocardial Perfusion Study Information Sheet for the test. LM to arrive 15 minutes early, and that it is imperative to arrive on time for appointment to keep from having the test rescheduled. If you need to cancel or reschedule your appointment, please call the office within 24 hours of your appointment.  Phone number given for call back for any questions.

## 2022-01-24 NOTE — Progress Notes (Signed)
  Subjective:  Patient ID: Antonio Ayers, male    DOB: [birth year],  MRN: [id number]  Antonio Ayers presents to clinic today for painful elongated mycotic toenails 1-5 bilaterally which are tender when wearing enclosed shoe gear. Pain is relieved with periodic professional debridement.  Patient is a former patient of Dr. Leeanne Rio.  He states he will be having hip surgery soon.    New problem(s): None.   PCP is [name], [name], MD , and last visit was Dec 15, 2021.  No Known Allergies  Review of Systems: Negative except as noted in the HPI.  Objective: No changes noted in today's physical examination.  Vascular Examination: CFT <3 seconds b/l LE. Palpable DP pulse(s) b/l LE. Faintly palpable PT pulse(s) b/l LE. Pedal hair sparse. No pain with calf compression b/l. Lower extremity skin temperature gradient within normal limits. No cyanosis or clubbing noted b/l LE.  Neurological Examination: Sensation grossly intact b/l with 10 gram monofilament. Vibratory sensation intact b/l.   Dermatological Examination: Pedal skin with normal turgor, texture and tone b/l. Toenails 1-5 b/l thick, discolored, elongated with subungual debris and pain on dorsal palpation. No hyperkeratotic lesions noted b/l.   Musculoskeletal Examination: Muscle strength 5/5 to b/l LE. No pain, crepitus or joint limitation noted with ROM bilateral LE. Wearing appropriate fitting shoe gear. Utilizes cane for ambulation assistance.  Radiographs: None  Last A1c:      Latest Ref Rng & Units 07/08/2021    9:51 AM  Hemoglobin A1C  Hemoglobin-A1c 4.6 - 6.5 % 5.2    Assessment/Plan: 1. Pain due to onychomycosis of toenails of both feet    -Patient was evaluated and treated. All patient's and/or POA's questions/concerns answered on today's visit. -Patient will be having hip surgery soon. -Patient to continue soft, supportive shoe gear daily. -Mycotic toenails 1-5 bilaterally were debrided in length  and girth with sterile nail nippers and dremel without incident. -Patient/POA to call should there be question/concern in the interim.   Return in about 3 months (around 04/20/2022).  Marzetta Board, DPM

## 2022-01-25 ENCOUNTER — Other Ambulatory Visit: Payer: Self-pay | Admitting: Cardiology

## 2022-01-26 ENCOUNTER — Ambulatory Visit (HOSPITAL_COMMUNITY): Payer: Medicare Other | Attending: Internal Medicine

## 2022-01-26 ENCOUNTER — Encounter: Payer: Self-pay | Admitting: Cardiology

## 2022-01-26 DIAGNOSIS — I77819 Aortic ectasia, unspecified site: Secondary | ICD-10-CM | POA: Diagnosis not present

## 2022-01-26 DIAGNOSIS — E7800 Pure hypercholesterolemia, unspecified: Secondary | ICD-10-CM | POA: Diagnosis not present

## 2022-01-26 DIAGNOSIS — I351 Nonrheumatic aortic (valve) insufficiency: Secondary | ICD-10-CM | POA: Diagnosis not present

## 2022-01-26 DIAGNOSIS — I2510 Atherosclerotic heart disease of native coronary artery without angina pectoris: Secondary | ICD-10-CM | POA: Diagnosis not present

## 2022-01-26 LAB — ECHOCARDIOGRAM COMPLETE
Area-P 1/2: 2.99 cm2
P 1/2 time: 385 msec
S' Lateral: 3.9 cm

## 2022-01-29 ENCOUNTER — Ambulatory Visit (HOSPITAL_COMMUNITY): Payer: Medicare Other | Attending: Cardiology

## 2022-01-29 ENCOUNTER — Other Ambulatory Visit (HOSPITAL_COMMUNITY): Payer: Medicare Other

## 2022-01-29 DIAGNOSIS — R0609 Other forms of dyspnea: Secondary | ICD-10-CM

## 2022-01-29 DIAGNOSIS — I77810 Thoracic aortic ectasia: Secondary | ICD-10-CM

## 2022-01-29 DIAGNOSIS — I351 Nonrheumatic aortic (valve) insufficiency: Secondary | ICD-10-CM | POA: Diagnosis not present

## 2022-01-29 DIAGNOSIS — E7800 Pure hypercholesterolemia, unspecified: Secondary | ICD-10-CM

## 2022-01-29 DIAGNOSIS — R931 Abnormal findings on diagnostic imaging of heart and coronary circulation: Secondary | ICD-10-CM | POA: Diagnosis not present

## 2022-01-29 DIAGNOSIS — I77819 Aortic ectasia, unspecified site: Secondary | ICD-10-CM | POA: Diagnosis not present

## 2022-01-29 DIAGNOSIS — I9589 Other hypotension: Secondary | ICD-10-CM

## 2022-01-29 DIAGNOSIS — Z01818 Encounter for other preprocedural examination: Secondary | ICD-10-CM | POA: Diagnosis not present

## 2022-01-29 LAB — MYOCARDIAL PERFUSION IMAGING
LV dias vol: 97 mL (ref 62–150)
LV sys vol: 51 mL
Nuc Stress EF: 47 %
Peak HR: 95 {beats}/min
Rest HR: 71 {beats}/min
Rest Nuclear Isotope Dose: 10.3 mCi
SDS: 0
SRS: 0
SSS: 0
ST Depression (mm): 0 mm
Stress Nuclear Isotope Dose: 31.3 mCi
TID: 0.9

## 2022-01-29 MED ORDER — TECHNETIUM TC 99M TETROFOSMIN IV KIT
10.3000 | PACK | Freq: Once | INTRAVENOUS | Status: AC | PRN
  Administered 2022-01-29: 10.3 via INTRAVENOUS

## 2022-01-29 MED ORDER — REGADENOSON 0.4 MG/5ML IV SOLN
0.4000 mg | Freq: Once | INTRAVENOUS | Status: AC
  Administered 2022-01-29: 0.4 mg via INTRAVENOUS

## 2022-01-29 MED ORDER — TECHNETIUM TC 99M TETROFOSMIN IV KIT
31.3000 | PACK | Freq: Once | INTRAVENOUS | Status: AC | PRN
  Administered 2022-01-29: 31.3 via INTRAVENOUS

## 2022-02-01 ENCOUNTER — Telehealth: Payer: Self-pay | Admitting: Cardiology

## 2022-02-01 ENCOUNTER — Encounter: Payer: Self-pay | Admitting: Physician Assistant

## 2022-02-02 ENCOUNTER — Ambulatory Visit: Payer: Medicare Other | Admitting: Cardiology

## 2022-02-02 DIAGNOSIS — M6281 Muscle weakness (generalized): Secondary | ICD-10-CM | POA: Diagnosis not present

## 2022-02-02 DIAGNOSIS — K5900 Constipation, unspecified: Secondary | ICD-10-CM | POA: Diagnosis not present

## 2022-02-02 DIAGNOSIS — M62838 Other muscle spasm: Secondary | ICD-10-CM | POA: Diagnosis not present

## 2022-02-05 ENCOUNTER — Ambulatory Visit: Payer: Medicare Other | Admitting: Podiatry

## 2022-02-12 ENCOUNTER — Ambulatory Visit: Payer: Self-pay | Admitting: Student

## 2022-02-13 ENCOUNTER — Ambulatory Visit: Payer: Self-pay | Admitting: Student

## 2022-02-13 NOTE — H&P (Unsigned)
TOTAL HIP ADMISSION H&P  Patient is admitted for left total hip arthroplasty.  Subjective:  Chief Complaint: left hip pain  HPI: Antonio Ayers, 71 y.o. male, has a history of pain and functional disability in the left hip(s) due to arthritis and patient has failed non-surgical conservative treatments for greater than 12 weeks to include NSAID's and/or analgesics, flexibility and strengthening excercises, use of assistive devices, and activity modification.  Onset of symptoms was gradual starting 10 years ago with rapidlly worsening course since that time.The patient noted no past surgery on the left hip(s).  Patient currently rates pain in the left hip at 10 out of 10 with activity. Patient has night pain, worsening of pain with activity and weight bearing, trendelenberg gait, pain that interfers with activities of daily living, and pain with passive range of motion. Patient has evidence of subchondral cysts, subchondral sclerosis, periarticular osteophytes, joint space narrowing, and protrusio deformity  by imaging studies. This condition presents safety issues increasing the risk of falls.   There is no current active infection.  Patient Active Problem List   Diagnosis Date Noted   Arterial hypotension 01/20/2022   Preoperative clearance 01/20/2022   Pure hypercholesterolemia 01/20/2022   Acquired dilation of ascending aorta and aortic root (HCC) 01/20/2022   Agatston coronary artery calcium score greater than 400    Aortic aneurysm (HCC)    Nonrheumatic aortic valve insufficiency 01/09/2019   Nephrolithiasis 01/09/2019   BPH (benign prostatic hyperplasia) 01/09/2019   Overweight (BMI 25.0-29.9) 01/09/2019   Primary osteoarthritis of both hips 12/29/2018   Primary osteoarthritis of both knees 12/29/2018   Raised intraocular pressure of both eyes 12/19/2018   Ankylosing spondylitis of multiple sites in spine ([place]) 12/19/2018   Hiatal hernia with GERD 11/08/2018   History of  colonoscopy 01/07/2018   Gall stone in bile duct with infection of gallbladder 12/14/2017   Hernia of abdominal cavity 12/07/2016   Dislocated elbow, left, initial encounter 10/07/2016   Enlarged liver 12/08/2006   Aortic anomaly 01/07/2005   Fracture 02/17/2004   Keratoconus 12/07/1977   Past Medical History:  Diagnosis Date   Abdominal hernia    Agatston coronary artery calcium score greater than 400    coronary Ca 454 on CT 2021 //  Myoview 01/2022: EF 47; no ischemia or infarction; low risk   Ankylosing spondylitis (HCC)    Aortic insufficiency    Ascending aorta dilatation (HCC)    4cm by echo 01/2022   Chronic diarrhea    Colon polyp    Complication of anesthesia    GERD (gastroesophageal reflux disease)    Headache    Heart murmur    History of hiatal hernia    History of kidney stones    Nephrolithiasis    PONV (postoperative nausea and vomiting)     Past Surgical History:  Procedure Laterality Date   CATARACT EXTRACTION Left 2016   CHOLECYSTECTOMY  01/2018   COLONOSCOPY     02/2015   CORNEAL TRANSPLANT  1989, 1992, 2014   HEMORRHOID SURGERY N/A 03/20/2021   Procedure: HEMORRHOIDECTOMY WITH LIGATION AND HEMORRHOIDOPEXY;  Surgeon: Michael Boston, MD;  Location: [facility];  Service: General;  Laterality: N/A;   HERNIA REPAIR  2018   HIATAL HERNIA REPAIR     KIDNEY STONE SURGERY  03/2018   MASS EXCISION N/A 03/20/2021   Procedure: REMOVAL OF ANAL polyp x2;  Surgeon: Michael Boston, MD;  Location: [facility];  Service: General;  Laterality: N/A;   PROSTATE BIOPSY  x3   RECTAL EXAM UNDER ANESTHESIA N/A 03/20/2021   Procedure: ANORECTAL EXAM UNDER ANESTHESIA;  Surgeon: Michael Boston, MD;  Location: [facility];  Service: General;  Laterality: N/A;   UPPER GASTROINTESTINAL ENDOSCOPY      Current Outpatient Medications  Medication Sig Dispense Refill Last Dose   acetaminophen (TYLENOL) 325 MG tablet Take 325 mg by mouth every 6 (six) hours as needed for mild pain or moderate pain.       Adalimumab (HUMIRA PEN) 40 MG/0.4ML PNKT Inject 40 mg into the skin every 14 (fourteen) days. 6 each 0    atorvastatin (LIPITOR) 20 MG tablet TAKE 1 TABLET(20 MG) BY MOUTH DAILY 90 tablet 3    benzonatate (TESSALON) 100 MG capsule benzonatate 100 mg capsule      brimonidine (ALPHAGAN) 0.15 % ophthalmic solution 1 drop 2 (two) times daily.      Cholecalciferol 25 MCG (1000 UT) capsule Take 1,000 Units by mouth daily.       dorzolamide (TRUSOPT) 2 % ophthalmic solution 1 drop 2 (two) times daily.      fluticasone (FLONASE) 50 MCG/ACT nasal spray SHAKE LIQUID AND USE 1 SPRAY IN EACH NOSTRIL TWICE DAILY 48 g 0    latanoprost (XALATAN) 0.005 % ophthalmic solution Place 1 drop into both eyes at bedtime.      LOTEMAX 0.5 % GEL Place 1 drop into the left eye daily.      PFIZER COVID-19 VAC BIVALENT injection       polyethylene glycol (MIRALAX / GLYCOLAX) 17 g packet Take 17 g by mouth daily as needed (Constipation).      potassium citrate (UROCIT-K) 10 MEQ (1080 MG) SR tablet Take 10 mEq by mouth 3 (three) times daily.      psyllium (METAMUCIL SMOOTH TEXTURE) 28 % packet Take 1 packet by mouth 2 (two) times daily.      No current facility-administered medications for this visit.   No Known Allergies  Social History   Tobacco Use   Smoking status: Never    Passive exposure: Never   Smokeless tobacco: Never  Substance Use Topics   Alcohol use: Yes    Comment: Occasionally beer    Family History  Problem Relation Age of Onset   Stroke Mother    Stroke Father    Heart failure Father    Diabetes Sister    Obesity Sister    Hiatal hernia Sister    Colon cancer Neg Hx    Esophageal cancer Neg Hx    Rectal cancer Neg Hx    Stomach cancer Neg Hx      Review of Systems  Cardiovascular:  Positive for leg swelling.  Musculoskeletal:  Positive for arthralgias, back pain, gait problem, joint swelling, myalgias, neck pain and neck stiffness.  All other systems reviewed and are  negative.   Objective:  Physical Exam Constitutional:      Appearance: Normal appearance.  HENT:     Head: Normocephalic and atraumatic.     Nose: Nose normal.     Mouth/Throat:     Mouth: Mucous membranes are moist.     Pharynx: Oropharynx is clear.  Eyes:     Extraocular Movements: Extraocular movements intact.  Cardiovascular:     Rate and Rhythm: Normal rate and regular rhythm.     Pulses: Normal pulses.     Heart sounds: Normal heart sounds.  Pulmonary:     Effort: Pulmonary effort is normal.     Breath sounds: Normal breath sounds.  Abdominal:     General: Abdomen is flat.     Palpations: Abdomen is soft.  Genitourinary:    Comments: deferred Musculoskeletal:     Cervical back: Rigidity present.     Comments: Examination of the left hip reveals no skin wounds or lesions. He has mild trochanteric tenderness to palpation. He has a 66 degree flexion contracture, and he does not flex any further. He internally rotates 0 degrees and externally rotates 10 degrees. There is 0 degrees of abduction. Pain with range of motion.   Sensory and motor function intact in LE bilaterally. Distal pedal pulses 2+ bilaterally.   Mild pedal edema at baseline. Calves soft and non-tender.   Skin:    General: Skin is warm and dry.     Capillary Refill: Capillary refill takes less than 2 seconds.  Neurological:     General: No focal deficit present.     Mental Status: He is alert and oriented to person, place, and time.  Psychiatric:        Mood and Affect: Mood normal.        Behavior: Behavior normal.        Thought Content: Thought content normal.        Judgment: Judgment normal.     Vital signs in last 24 hours: '@VSRANGES'$ @  Labs:   Estimated body mass index is 28.23 kg/m as calculated from the following:   Height as of 01/29/22: '6\' 1"'$  (1.854 m).   Weight as of 01/29/22: 97.1 kg.   Imaging Review Plain radiographs demonstrate severe degenerative joint disease of the left  hip(s). The bone quality appears to be adequate for age and reported activity level.      Assessment/Plan:  End stage arthritis, left hip(s)  The patient history, physical examination, clinical judgement of the provider and imaging studies are consistent with end stage degenerative joint disease of the left hip(s) and total hip arthroplasty is deemed medically necessary. The treatment options including medical management, injection therapy, arthroscopy and arthroplasty were discussed at length. The risks and benefits of total hip arthroplasty were presented and reviewed. The risks due to aseptic loosening, infection, stiffness, dislocation/subluxation,  thromboembolic complications and other imponderables were discussed.  The patient acknowledged the explanation, agreed to proceed with the plan and consent was signed. Patient is being admitted for inpatient treatment for surgery, pain control, PT, OT, prophylactic antibiotics, VTE prophylaxis, progressive ambulation and ADL's and discharge planning.The patient is planning to be discharged home with HEP.   Therapy Plans: HEP.  Disposition: Home with neice Planned DVT Prophylaxis: aspirin '81mg'$  BID DME needed: walker. PCP: Cleared. Cardiology: Cleared.  TXA: Yes Allergies: NDKA Anesthesia Concerns: None. Has had issues with nausea in the past.  BMI: 29 Last HgbA1c: Not diabetic Other:** - History of neck pain and stiffness, ankylosing spondylitis, and rheumatoid arthritis. On Humira. Last injection 01/25/22. - Heart: Ascending aorta dilation, aortic insufficiency.  - Baseline pedal edema.  - Cr. 0.71 - Hydrocodone, zofran.  - Chronic hypotension with dizziness.    Patient's anticipated LOS is less than 2 midnights, meeting these requirements: - Younger than 69 - Lives within 1 hour of care - Has a competent adult at home to recover with post-op recover - NO history of  - Chronic pain requiring opiods  - Diabetes  - Coronary Artery  Disease  - Heart failure  - Heart attack  - Stroke  - DVT/VTE  - Cardiac arrhythmia  - Respiratory Failure/COPD  - Renal failure  -  Anemia  - Advanced Liver disease

## 2022-02-15 DIAGNOSIS — M6281 Muscle weakness (generalized): Secondary | ICD-10-CM | POA: Diagnosis not present

## 2022-02-15 DIAGNOSIS — K5900 Constipation, unspecified: Secondary | ICD-10-CM | POA: Diagnosis not present

## 2022-02-15 DIAGNOSIS — M62838 Other muscle spasm: Secondary | ICD-10-CM | POA: Diagnosis not present

## 2022-02-17 ENCOUNTER — Encounter (HOSPITAL_COMMUNITY): Admission: RE | Admit: 2022-02-17 | Payer: Medicare Other | Source: Ambulatory Visit

## 2022-02-18 ENCOUNTER — Encounter (HOSPITAL_COMMUNITY): Payer: Self-pay

## 2022-02-18 ENCOUNTER — Emergency Department (HOSPITAL_COMMUNITY): Payer: Medicare Other

## 2022-02-18 ENCOUNTER — Inpatient Hospital Stay (HOSPITAL_COMMUNITY)
Admission: EM | Admit: 2022-02-18 | Discharge: 2022-03-09 | DRG: 344 | Disposition: E | Payer: Medicare Other | Attending: General Surgery | Admitting: General Surgery

## 2022-02-18 ENCOUNTER — Inpatient Hospital Stay (HOSPITAL_COMMUNITY): Payer: Medicare Other

## 2022-02-18 ENCOUNTER — Other Ambulatory Visit: Payer: Self-pay

## 2022-02-18 DIAGNOSIS — R6889 Other general symptoms and signs: Secondary | ICD-10-CM | POA: Diagnosis not present

## 2022-02-18 DIAGNOSIS — Z515 Encounter for palliative care: Secondary | ICD-10-CM | POA: Diagnosis not present

## 2022-02-18 DIAGNOSIS — R918 Other nonspecific abnormal finding of lung field: Secondary | ICD-10-CM | POA: Diagnosis not present

## 2022-02-18 DIAGNOSIS — K769 Liver disease, unspecified: Secondary | ICD-10-CM | POA: Diagnosis not present

## 2022-02-18 DIAGNOSIS — Z79899 Other long term (current) drug therapy: Secondary | ICD-10-CM

## 2022-02-18 DIAGNOSIS — S36113A Laceration of liver, unspecified degree, initial encounter: Secondary | ICD-10-CM | POA: Diagnosis not present

## 2022-02-18 DIAGNOSIS — I4892 Unspecified atrial flutter: Secondary | ICD-10-CM | POA: Diagnosis not present

## 2022-02-18 DIAGNOSIS — E876 Hypokalemia: Secondary | ICD-10-CM | POA: Diagnosis not present

## 2022-02-18 DIAGNOSIS — J15211 Pneumonia due to Methicillin susceptible Staphylococcus aureus: Secondary | ICD-10-CM | POA: Diagnosis present

## 2022-02-18 DIAGNOSIS — S22039A Unspecified fracture of third thoracic vertebra, initial encounter for closed fracture: Secondary | ICD-10-CM | POA: Diagnosis not present

## 2022-02-18 DIAGNOSIS — M47816 Spondylosis without myelopathy or radiculopathy, lumbar region: Secondary | ICD-10-CM | POA: Diagnosis not present

## 2022-02-18 DIAGNOSIS — I2510 Atherosclerotic heart disease of native coronary artery without angina pectoris: Secondary | ICD-10-CM | POA: Diagnosis not present

## 2022-02-18 DIAGNOSIS — S22029A Unspecified fracture of second thoracic vertebra, initial encounter for closed fracture: Secondary | ICD-10-CM | POA: Diagnosis not present

## 2022-02-18 DIAGNOSIS — M452 Ankylosing spondylitis of cervical region: Secondary | ICD-10-CM | POA: Diagnosis not present

## 2022-02-18 DIAGNOSIS — N401 Enlarged prostate with lower urinary tract symptoms: Secondary | ICD-10-CM | POA: Diagnosis present

## 2022-02-18 DIAGNOSIS — J9690 Respiratory failure, unspecified, unspecified whether with hypoxia or hypercapnia: Secondary | ICD-10-CM | POA: Diagnosis not present

## 2022-02-18 DIAGNOSIS — S22049A Unspecified fracture of fourth thoracic vertebra, initial encounter for closed fracture: Secondary | ICD-10-CM | POA: Diagnosis not present

## 2022-02-18 DIAGNOSIS — R0600 Dyspnea, unspecified: Secondary | ICD-10-CM | POA: Diagnosis not present

## 2022-02-18 DIAGNOSIS — I469 Cardiac arrest, cause unspecified: Secondary | ICD-10-CM | POA: Diagnosis not present

## 2022-02-18 DIAGNOSIS — M25561 Pain in right knee: Secondary | ICD-10-CM | POA: Diagnosis present

## 2022-02-18 DIAGNOSIS — J90 Pleural effusion, not elsewhere classified: Secondary | ICD-10-CM | POA: Diagnosis not present

## 2022-02-18 DIAGNOSIS — K567 Ileus, unspecified: Secondary | ICD-10-CM | POA: Diagnosis present

## 2022-02-18 DIAGNOSIS — M25571 Pain in right ankle and joints of right foot: Secondary | ICD-10-CM | POA: Diagnosis not present

## 2022-02-18 DIAGNOSIS — K5939 Other megacolon: Secondary | ICD-10-CM | POA: Diagnosis not present

## 2022-02-18 DIAGNOSIS — A419 Sepsis, unspecified organism: Secondary | ICD-10-CM | POA: Diagnosis not present

## 2022-02-18 DIAGNOSIS — E440 Moderate protein-calorie malnutrition: Secondary | ICD-10-CM | POA: Diagnosis present

## 2022-02-18 DIAGNOSIS — E785 Hyperlipidemia, unspecified: Secondary | ICD-10-CM | POA: Diagnosis not present

## 2022-02-18 DIAGNOSIS — M542 Cervicalgia: Secondary | ICD-10-CM | POA: Diagnosis not present

## 2022-02-18 DIAGNOSIS — R17 Unspecified jaundice: Secondary | ICD-10-CM | POA: Diagnosis present

## 2022-02-18 DIAGNOSIS — S20219A Contusion of unspecified front wall of thorax, initial encounter: Secondary | ICD-10-CM | POA: Diagnosis not present

## 2022-02-18 DIAGNOSIS — S3993XA Unspecified injury of pelvis, initial encounter: Secondary | ICD-10-CM | POA: Diagnosis not present

## 2022-02-18 DIAGNOSIS — M25562 Pain in left knee: Secondary | ICD-10-CM | POA: Diagnosis present

## 2022-02-18 DIAGNOSIS — S36115A Moderate laceration of liver, initial encounter: Secondary | ICD-10-CM | POA: Diagnosis present

## 2022-02-18 DIAGNOSIS — I4430 Unspecified atrioventricular block: Secondary | ICD-10-CM | POA: Diagnosis not present

## 2022-02-18 DIAGNOSIS — Y92410 Unspecified street and highway as the place of occurrence of the external cause: Secondary | ICD-10-CM

## 2022-02-18 DIAGNOSIS — I77819 Aortic ectasia, unspecified site: Secondary | ICD-10-CM | POA: Diagnosis not present

## 2022-02-18 DIAGNOSIS — S27892A Contusion of other specified intrathoracic organs, initial encounter: Secondary | ICD-10-CM | POA: Diagnosis present

## 2022-02-18 DIAGNOSIS — I351 Nonrheumatic aortic (valve) insufficiency: Secondary | ICD-10-CM | POA: Diagnosis present

## 2022-02-18 DIAGNOSIS — T1490XA Injury, unspecified, initial encounter: Secondary | ICD-10-CM | POA: Diagnosis not present

## 2022-02-18 DIAGNOSIS — S2241XA Multiple fractures of ribs, right side, initial encounter for closed fracture: Secondary | ICD-10-CM | POA: Diagnosis present

## 2022-02-18 DIAGNOSIS — I71019 Dissection of thoracic aorta, unspecified: Secondary | ICD-10-CM | POA: Diagnosis not present

## 2022-02-18 DIAGNOSIS — J9600 Acute respiratory failure, unspecified whether with hypoxia or hypercapnia: Secondary | ICD-10-CM | POA: Diagnosis not present

## 2022-02-18 DIAGNOSIS — J9811 Atelectasis: Secondary | ICD-10-CM | POA: Diagnosis not present

## 2022-02-18 DIAGNOSIS — Z79620 Long term (current) use of immunosuppressive biologic: Secondary | ICD-10-CM

## 2022-02-18 DIAGNOSIS — K219 Gastro-esophageal reflux disease without esophagitis: Secondary | ICD-10-CM | POA: Diagnosis present

## 2022-02-18 DIAGNOSIS — K449 Diaphragmatic hernia without obstruction or gangrene: Secondary | ICD-10-CM | POA: Diagnosis not present

## 2022-02-18 DIAGNOSIS — W2211XA Striking against or struck by driver side automobile airbag, initial encounter: Secondary | ICD-10-CM

## 2022-02-18 DIAGNOSIS — S32301A Unspecified fracture of right ilium, initial encounter for closed fracture: Secondary | ICD-10-CM | POA: Diagnosis present

## 2022-02-18 DIAGNOSIS — Z9911 Dependence on respirator [ventilator] status: Secondary | ICD-10-CM | POA: Diagnosis not present

## 2022-02-18 DIAGNOSIS — Z947 Corneal transplant status: Secondary | ICD-10-CM

## 2022-02-18 DIAGNOSIS — Z041 Encounter for examination and observation following transport accident: Secondary | ICD-10-CM | POA: Diagnosis not present

## 2022-02-18 DIAGNOSIS — M79642 Pain in left hand: Secondary | ICD-10-CM | POA: Diagnosis not present

## 2022-02-18 DIAGNOSIS — E875 Hyperkalemia: Secondary | ICD-10-CM | POA: Diagnosis not present

## 2022-02-18 DIAGNOSIS — S22028A Other fracture of second thoracic vertebra, initial encounter for closed fracture: Secondary | ICD-10-CM | POA: Diagnosis not present

## 2022-02-18 DIAGNOSIS — M25572 Pain in left ankle and joints of left foot: Secondary | ICD-10-CM | POA: Diagnosis not present

## 2022-02-18 DIAGNOSIS — I700 Atherosclerosis of aorta: Secondary | ICD-10-CM | POA: Diagnosis present

## 2022-02-18 DIAGNOSIS — Z6829 Body mass index (BMI) 29.0-29.9, adult: Secondary | ICD-10-CM

## 2022-02-18 DIAGNOSIS — H409 Unspecified glaucoma: Secondary | ICD-10-CM | POA: Diagnosis not present

## 2022-02-18 DIAGNOSIS — Z743 Need for continuous supervision: Secondary | ICD-10-CM | POA: Diagnosis not present

## 2022-02-18 DIAGNOSIS — N3289 Other specified disorders of bladder: Secondary | ICD-10-CM | POA: Diagnosis not present

## 2022-02-18 DIAGNOSIS — K5981 Ogilvie syndrome: Secondary | ICD-10-CM | POA: Diagnosis not present

## 2022-02-18 DIAGNOSIS — S36039A Unspecified laceration of spleen, initial encounter: Secondary | ICD-10-CM | POA: Diagnosis not present

## 2022-02-18 DIAGNOSIS — M7981 Nontraumatic hematoma of soft tissue: Secondary | ICD-10-CM | POA: Diagnosis not present

## 2022-02-18 DIAGNOSIS — S270XXA Traumatic pneumothorax, initial encounter: Secondary | ICD-10-CM | POA: Diagnosis not present

## 2022-02-18 DIAGNOSIS — R338 Other retention of urine: Secondary | ICD-10-CM | POA: Diagnosis present

## 2022-02-18 DIAGNOSIS — S0990XA Unspecified injury of head, initial encounter: Secondary | ICD-10-CM | POA: Diagnosis not present

## 2022-02-18 DIAGNOSIS — R32 Unspecified urinary incontinence: Secondary | ICD-10-CM | POA: Diagnosis not present

## 2022-02-18 DIAGNOSIS — Z66 Do not resuscitate: Secondary | ICD-10-CM | POA: Diagnosis present

## 2022-02-18 DIAGNOSIS — R451 Restlessness and agitation: Secondary | ICD-10-CM | POA: Diagnosis not present

## 2022-02-18 DIAGNOSIS — Z4682 Encounter for fitting and adjustment of non-vascular catheter: Secondary | ICD-10-CM | POA: Diagnosis not present

## 2022-02-18 DIAGNOSIS — S22038A Other fracture of third thoracic vertebra, initial encounter for closed fracture: Secondary | ICD-10-CM | POA: Diagnosis not present

## 2022-02-18 DIAGNOSIS — N200 Calculus of kidney: Secondary | ICD-10-CM | POA: Diagnosis not present

## 2022-02-18 DIAGNOSIS — K6389 Other specified diseases of intestine: Secondary | ICD-10-CM | POA: Diagnosis not present

## 2022-02-18 DIAGNOSIS — M47814 Spondylosis without myelopathy or radiculopathy, thoracic region: Secondary | ICD-10-CM | POA: Diagnosis not present

## 2022-02-18 DIAGNOSIS — J9601 Acute respiratory failure with hypoxia: Secondary | ICD-10-CM | POA: Diagnosis not present

## 2022-02-18 DIAGNOSIS — M4314 Spondylolisthesis, thoracic region: Secondary | ICD-10-CM | POA: Diagnosis not present

## 2022-02-18 DIAGNOSIS — R0603 Acute respiratory distress: Secondary | ICD-10-CM | POA: Diagnosis not present

## 2022-02-18 DIAGNOSIS — R4182 Altered mental status, unspecified: Secondary | ICD-10-CM | POA: Diagnosis present

## 2022-02-18 DIAGNOSIS — G8929 Other chronic pain: Secondary | ICD-10-CM | POA: Diagnosis present

## 2022-02-18 DIAGNOSIS — R739 Hyperglycemia, unspecified: Secondary | ICD-10-CM | POA: Diagnosis present

## 2022-02-18 DIAGNOSIS — Z8249 Family history of ischemic heart disease and other diseases of the circulatory system: Secondary | ICD-10-CM

## 2022-02-18 DIAGNOSIS — S36114A Minor laceration of liver, initial encounter: Secondary | ICD-10-CM | POA: Diagnosis not present

## 2022-02-18 LAB — URINALYSIS, ROUTINE W REFLEX MICROSCOPIC
Bacteria, UA: NONE SEEN
Bilirubin Urine: NEGATIVE
Glucose, UA: NEGATIVE mg/dL
Ketones, ur: NEGATIVE mg/dL
Leukocytes,Ua: NEGATIVE
Nitrite: NEGATIVE
Protein, ur: NEGATIVE mg/dL
Specific Gravity, Urine: >1.046 — ABNORMAL HIGH (ref 1.005–1.030)
pH: 5 (ref 5.0–8.0)

## 2022-02-18 LAB — COMPREHENSIVE METABOLIC PANEL
ALT: 23 U/L (ref 0–44)
AST: 27 U/L (ref 15–41)
Albumin: 3.9 g/dL (ref 3.5–5.0)
Alkaline Phosphatase: 93 U/L (ref 38–126)
Anion gap: 8 (ref 5–15)
BUN: 14 mg/dL (ref 8–23)
CO2: 21 mmol/L — ABNORMAL LOW (ref 22–32)
Calcium: 8.9 mg/dL (ref 8.9–10.3)
Chloride: 108 mmol/L (ref 98–111)
Creatinine, Ser: 0.65 mg/dL (ref 0.61–1.24)
GFR, Estimated: >60 mL/min (ref >60)
Glucose, Bld: 140 mg/dL — ABNORMAL HIGH (ref 70–99)
Potassium: 3.8 mmol/L (ref 3.5–5.1)
Sodium: 137 mmol/L (ref 135–145)
Total Bilirubin: 2.3 mg/dL — ABNORMAL HIGH (ref 0.3–1.2)
Total Protein: 6.7 g/dL (ref 6.5–8.1)

## 2022-02-18 LAB — I-STAT CHEM 8, ED
BUN: 16 mg/dL (ref 8–23)
Calcium, Ion: 1.12 mmol/L — ABNORMAL LOW (ref 1.15–1.40)
Chloride: 105 mmol/L (ref 98–111)
Creatinine, Ser: 0.6 mg/dL — ABNORMAL LOW (ref 0.61–1.24)
Glucose, Bld: 137 mg/dL — ABNORMAL HIGH (ref 70–99)
HCT: 41 % (ref 39.0–52.0)
Hemoglobin: 13.9 g/dL (ref 13.0–17.0)
Potassium: 3.9 mmol/L (ref 3.5–5.1)
Sodium: 139 mmol/L (ref 135–145)
TCO2: 21 mmol/L — ABNORMAL LOW (ref 22–32)

## 2022-02-18 LAB — CBC
HCT: 40.5 % (ref 39.0–52.0)
Hemoglobin: 14.5 g/dL (ref 13.0–17.0)
MCH: 31.9 pg (ref 26.0–34.0)
MCHC: 35.8 g/dL (ref 30.0–36.0)
MCV: 89 fL (ref 80.0–100.0)
Platelets: 145 10*3/uL — ABNORMAL LOW (ref 150–400)
RBC: 4.55 MIL/uL (ref 4.22–5.81)
RDW: 12.9 % (ref 11.5–15.5)
WBC: 6.8 10*3/uL (ref 4.0–10.5)
nRBC: 0 % (ref 0.0–0.2)

## 2022-02-18 LAB — SAMPLE TO BLOOD BANK

## 2022-02-18 LAB — TYPE AND SCREEN
ABO/RH(D): A POS
Antibody Screen: NEGATIVE

## 2022-02-18 LAB — ETHANOL: Alcohol, Ethyl (B): <10 mg/dL (ref <10)

## 2022-02-18 LAB — PROTIME-INR
INR: 1.1 (ref 0.8–1.2)
Prothrombin Time: 14.3 seconds (ref 11.4–15.2)

## 2022-02-18 MED ORDER — HALOPERIDOL LACTATE 5 MG/ML IJ SOLN
5.0000 mg | Freq: Four times a day (QID) | INTRAMUSCULAR | Status: DC | PRN
  Administered 2022-02-18 – 2022-03-04 (×4): 5 mg via INTRAVENOUS
  Filled 2022-02-18 (×4): qty 1

## 2022-02-18 MED ORDER — METHOCARBAMOL 1000 MG/10ML IJ SOLN
500.0000 mg | Freq: Three times a day (TID) | INTRAVENOUS | Status: DC | PRN
  Administered 2022-02-19: 500 mg via INTRAVENOUS
  Filled 2022-02-18 (×2): qty 5

## 2022-02-18 MED ORDER — ONDANSETRON HCL 4 MG/2ML IJ SOLN
4.0000 mg | Freq: Four times a day (QID) | INTRAMUSCULAR | Status: DC | PRN
Start: 2022-02-18 — End: 2022-03-05
  Administered 2022-02-18: 4 mg via INTRAVENOUS
  Filled 2022-02-18: qty 2

## 2022-02-18 MED ORDER — METHOCARBAMOL 500 MG PO TABS
500.0000 mg | ORAL_TABLET | Freq: Three times a day (TID) | ORAL | Status: DC | PRN
Start: 2022-02-18 — End: 2022-02-19
  Filled 2022-02-18: qty 1

## 2022-02-18 MED ORDER — OXYCODONE HCL 5 MG PO TABS
5.0000 mg | ORAL_TABLET | ORAL | Status: DC | PRN
  Administered 2022-02-18 – 2022-02-19 (×3): 10 mg via ORAL
  Filled 2022-02-18 (×3): qty 2

## 2022-02-18 MED ORDER — ONDANSETRON 4 MG PO TBDP: 4.0000 mg | ORAL_TABLET | Freq: Four times a day (QID) | ORAL | Status: DC | PRN

## 2022-02-18 MED ORDER — FENTANYL CITRATE PF 50 MCG/ML IJ SOSY
50.0000 ug | PREFILLED_SYRINGE | Freq: Once | INTRAMUSCULAR | Status: AC
  Administered 2022-02-18: 50 ug via INTRAVENOUS
  Filled 2022-02-18: qty 1

## 2022-02-18 MED ORDER — HYDROMORPHONE HCL 1 MG/ML IJ SOLN
1.0000 mg | Freq: Once | INTRAMUSCULAR | Status: AC
  Administered 2022-02-18: 1 mg via INTRAVENOUS
  Filled 2022-02-18: qty 1

## 2022-02-18 MED ORDER — DOCUSATE SODIUM 100 MG PO CAPS
100.0000 mg | ORAL_CAPSULE | Freq: Two times a day (BID) | ORAL | Status: DC
  Filled 2022-02-18: qty 1

## 2022-02-18 MED ORDER — METOPROLOL TARTRATE 5 MG/5ML IV SOLN: 5.0000 mg | Freq: Four times a day (QID) | INTRAVENOUS | Status: DC | PRN

## 2022-02-18 MED ORDER — ACETAMINOPHEN 325 MG PO TABS
650.0000 mg | ORAL_TABLET | Freq: Four times a day (QID) | ORAL | Status: DC
  Administered 2022-02-18 – 2022-02-19 (×3): 650 mg via ORAL
  Filled 2022-02-18 (×3): qty 2

## 2022-02-18 MED ORDER — SODIUM CHLORIDE 0.9 % IV SOLN: INTRAVENOUS | Status: DC

## 2022-02-18 MED ORDER — IOHEXOL 300 MG/ML  SOLN
100.0000 mL | Freq: Once | INTRAMUSCULAR | Status: AC | PRN
  Administered 2022-02-18: 100 mL via INTRAVENOUS

## 2022-02-18 MED ORDER — LORAZEPAM 2 MG/ML IJ SOLN
0.5000 mg | Freq: Four times a day (QID) | INTRAMUSCULAR | Status: AC | PRN
  Administered 2022-02-18 – 2022-02-19 (×2): 0.5 mg via INTRAVENOUS
  Filled 2022-02-18 (×2): qty 1

## 2022-02-18 MED ORDER — MORPHINE SULFATE (PF) 2 MG/ML IV SOLN
2.0000 mg | INTRAVENOUS | Status: DC | PRN
  Administered 2022-02-18 – 2022-03-05 (×2): 2 mg via INTRAVENOUS
  Filled 2022-02-18 (×2): qty 1

## 2022-02-18 MED ORDER — MORPHINE SULFATE (PF) 4 MG/ML IV SOLN: 4.0000 mg | INTRAVENOUS | Status: DC | PRN
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
## 2022-02-18 NOTE — Progress Notes (Signed)
Pt arrived to [facility] from ED with 2 bags of belongings. A&O x 4 with some confusion. VS documented. Pt c/o 6/10 pain to lower back. CHG bath complete. Sacral drsg placed. Pt educated on call light and left within reach. Pt oriented to unit. Bed left in lowest position with bed alarm on.
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
## 2022-02-18 NOTE — ED Notes (Signed)
Verbal order per MD, OK to d/c [facility] restriction for pt comfort. Maintain TLSO brace as previously directed.

## 2022-02-18 NOTE — Progress Notes (Signed)
Orthopedic Tech Progress Note Patient Details:  Antonio Ayers 1951/02/18 [id number] TLSO Brace has been ordered from [facility]  Patient ID: [name], male   DOB: 12-03-1950, 71 y.o.   MRN: [id number]  Jearld Lesch 03/05/2022, 3:36 PM

## 2022-02-18 NOTE — ED Notes (Signed)
[employer] at bedside 

## 2022-02-19 ENCOUNTER — Inpatient Hospital Stay (HOSPITAL_COMMUNITY): Payer: Medicare Other

## 2022-02-19 ENCOUNTER — Inpatient Hospital Stay (HOSPITAL_COMMUNITY): Payer: Medicare Other | Admitting: Critical Care Medicine

## 2022-02-19 DIAGNOSIS — I351 Nonrheumatic aortic (valve) insufficiency: Secondary | ICD-10-CM | POA: Diagnosis not present

## 2022-02-19 LAB — CBC
HCT: 36.6 % — ABNORMAL LOW (ref 39.0–52.0)
HCT: 33.8 % — ABNORMAL LOW (ref 39.0–52.0)
Hemoglobin: 12.8 g/dL — ABNORMAL LOW (ref 13.0–17.0)
Hemoglobin: 11.9 g/dL — ABNORMAL LOW (ref 13.0–17.0)
MCH: 31.9 pg (ref 26.0–34.0)
MCH: 32.2 pg (ref 26.0–34.0)
MCHC: 35 g/dL (ref 30.0–36.0)
MCHC: 35.2 g/dL (ref 30.0–36.0)
MCV: 91.3 fL (ref 80.0–100.0)
MCV: 91.6 fL (ref 80.0–100.0)
Platelets: 127 10*3/uL — ABNORMAL LOW (ref 150–400)
Platelets: 107 10*3/uL — ABNORMAL LOW (ref 150–400)
RBC: 4.01 MIL/uL — ABNORMAL LOW (ref 4.22–5.81)
RBC: 3.69 MIL/uL — ABNORMAL LOW (ref 4.22–5.81)
RDW: 13 % (ref 11.5–15.5)
RDW: 13.1 % (ref 11.5–15.5)
WBC: 10.3 10*3/uL (ref 4.0–10.5)
WBC: 9.3 10*3/uL (ref 4.0–10.5)
nRBC: 0 % (ref 0.0–0.2)
nRBC: 0 % (ref 0.0–0.2)

## 2022-02-19 LAB — GLUCOSE, CAPILLARY
Glucose-Capillary: 109 mg/dL — ABNORMAL HIGH (ref 70–99)
Glucose-Capillary: 95 mg/dL (ref 70–99)
Glucose-Capillary: 114 mg/dL — ABNORMAL HIGH (ref 70–99)

## 2022-02-19 LAB — COMPREHENSIVE METABOLIC PANEL
ALT: 42 U/L (ref 0–44)
ALT: 47 U/L — ABNORMAL HIGH (ref 0–44)
AST: 114 U/L — ABNORMAL HIGH (ref 15–41)
AST: 123 U/L — ABNORMAL HIGH (ref 15–41)
Albumin: 3.6 g/dL (ref 3.5–5.0)
Albumin: 3.5 g/dL (ref 3.5–5.0)
Alkaline Phosphatase: 83 U/L (ref 38–126)
Alkaline Phosphatase: 74 U/L (ref 38–126)
Anion gap: 9 (ref 5–15)
Anion gap: 8 (ref 5–15)
BUN: 18 mg/dL (ref 8–23)
BUN: 20 mg/dL (ref 8–23)
CO2: 22 mmol/L (ref 22–32)
CO2: 23 mmol/L (ref 22–32)
Calcium: 8.5 mg/dL — ABNORMAL LOW (ref 8.9–10.3)
Calcium: 8.6 mg/dL — ABNORMAL LOW (ref 8.9–10.3)
Chloride: 108 mmol/L (ref 98–111)
Chloride: 109 mmol/L (ref 98–111)
Creatinine, Ser: 0.71 mg/dL (ref 0.61–1.24)
Creatinine, Ser: 0.65 mg/dL (ref 0.61–1.24)
GFR, Estimated: >60 mL/min (ref >60)
GFR, Estimated: >60 mL/min (ref >60)
Glucose, Bld: 124 mg/dL — ABNORMAL HIGH (ref 70–99)
Glucose, Bld: 105 mg/dL — ABNORMAL HIGH (ref 70–99)
Potassium: 4 mmol/L (ref 3.5–5.1)
Potassium: 3.9 mmol/L (ref 3.5–5.1)
Sodium: 139 mmol/L (ref 135–145)
Sodium: 140 mmol/L (ref 135–145)
Total Bilirubin: 4.3 mg/dL — ABNORMAL HIGH (ref 0.3–1.2)
Total Bilirubin: 5.2 mg/dL — ABNORMAL HIGH (ref 0.3–1.2)
Total Protein: 6.2 g/dL — ABNORMAL LOW (ref 6.5–8.1)
Total Protein: 6 g/dL — ABNORMAL LOW (ref 6.5–8.1)

## 2022-02-19 LAB — BLOOD GAS, ARTERIAL
Acid-Base Excess: 0 mmol/L (ref 0.0–2.0)
Bicarbonate: 26.4 mmol/L (ref 20.0–28.0)
Drawn by: 62551
O2 Saturation: 95.8 %
Patient temperature: 36.4
pCO2 arterial: 48 mmHg (ref 32–48)
pH, Arterial: 7.35 (ref 7.35–7.45)
pO2, Arterial: 73 mmHg — ABNORMAL LOW (ref 83–108)

## 2022-02-19 LAB — BILIRUBIN, FRACTIONATED(TOT/DIR/INDIR)
Bilirubin, Direct: 0.3 mg/dL — ABNORMAL HIGH (ref 0.0–0.2)
Indirect Bilirubin: 4.2 mg/dL — ABNORMAL HIGH (ref 0.3–0.9)
Total Bilirubin: 4.5 mg/dL — ABNORMAL HIGH (ref 0.3–1.2)

## 2022-02-19 LAB — AMMONIA: Ammonia: 48 umol/L — ABNORMAL HIGH (ref 9–35)

## 2022-02-19 MED ORDER — SUCCINYLCHOLINE CHLORIDE 200 MG/10ML IV SOSY
PREFILLED_SYRINGE | INTRAVENOUS | Status: DC | PRN
  Administered 2022-02-19: 120 mg via INTRAVENOUS

## 2022-02-19 MED ORDER — FENTANYL CITRATE PF 50 MCG/ML IJ SOSY: 25.0000 ug | PREFILLED_SYRINGE | Freq: Once | INTRAMUSCULAR | Status: DC

## 2022-02-19 MED ORDER — FENTANYL 2500MCG IN NS 250ML (10MCG/ML) PREMIX INFUSION
25.0000 ug/h | INTRAVENOUS | Status: DC
  Administered 2022-02-19: 50 ug/h via INTRAVENOUS
  Administered 2022-02-20: 100 ug/h via INTRAVENOUS
  Administered 2022-02-21: 125 ug/h via INTRAVENOUS
  Administered 2022-02-22: 100 ug/h via INTRAVENOUS
  Administered 2022-02-23: 200 ug/h via INTRAVENOUS
  Administered 2022-02-23: 150 ug/h via INTRAVENOUS
  Administered 2022-02-24: 175 ug/h via INTRAVENOUS
  Administered 2022-02-24: 125 ug/h via INTRAVENOUS
  Administered 2022-02-25: 150 ug/h via INTRAVENOUS
  Administered 2022-02-26 (×2): 200 ug/h via INTRAVENOUS
  Administered 2022-02-27: 175 ug/h via INTRAVENOUS
  Administered 2022-02-27: 200 ug/h via INTRAVENOUS
  Filled 2022-02-19 (×14): qty 250

## 2022-02-19 MED ORDER — PHENYLEPHRINE 80 MCG/ML (10ML) SYRINGE FOR IV PUSH (FOR BLOOD PRESSURE SUPPORT)
PREFILLED_SYRINGE | INTRAVENOUS | Status: DC | PRN
  Administered 2022-02-19: 160 ug via INTRAVENOUS

## 2022-02-19 MED ORDER — NOREPINEPHRINE 4 MG/250ML-% IV SOLN
2.0000 ug/min | INTRAVENOUS | Status: DC
  Administered 2022-02-19: 2 ug/min via INTRAVENOUS
  Administered 2022-02-20: 3 ug/min via INTRAVENOUS
  Administered 2022-02-21: 5 ug/min via INTRAVENOUS
  Administered 2022-02-22: 2 ug/min via INTRAVENOUS
  Filled 2022-02-19 (×3): qty 250

## 2022-02-19 MED ORDER — DOCUSATE SODIUM 100 MG PO CAPS: 100.0000 mg | ORAL_CAPSULE | Freq: Two times a day (BID) | ORAL | Status: DC

## 2022-02-19 MED ORDER — VITAL HIGH PROTEIN PO LIQD: 1000.0000 mL | ORAL | Status: DC

## 2022-02-19 MED ORDER — SODIUM CHLORIDE 0.9 % IV SOLN: INTRAVENOUS | Status: DC | PRN

## 2022-02-19 MED ORDER — ORAL CARE MOUTH RINSE
15.0000 mL | OROMUCOSAL | Status: DC
  Administered 2022-02-19 – 2022-03-01 (×113): 15 mL via OROMUCOSAL

## 2022-02-19 MED ORDER — POLYETHYLENE GLYCOL 3350 17 G PO PACK
17.0000 g | PACK | Freq: Every day | ORAL | Status: DC
  Administered 2022-02-20 – 2022-02-23 (×4): 17 g
  Filled 2022-02-19 (×5): qty 1

## 2022-02-19 MED ORDER — PIVOT 1.5 CAL PO LIQD
1000.0000 mL | ORAL | Status: DC
  Administered 2022-02-19: 1000 mL
  Filled 2022-02-19: qty 1000

## 2022-02-19 MED ORDER — PROPOFOL 1000 MG/100ML IV EMUL
0.0000 ug/kg/min | INTRAVENOUS | Status: DC
  Administered 2022-02-19: 10 ug/kg/min via INTRAVENOUS
  Administered 2022-02-20: 20 ug/kg/min via INTRAVENOUS
  Administered 2022-02-20: 10 ug/kg/min via INTRAVENOUS
  Administered 2022-02-20 – 2022-02-21 (×2): 20 ug/kg/min via INTRAVENOUS
  Filled 2022-02-19 (×5): qty 100

## 2022-02-19 MED ORDER — IOHEXOL 350 MG/ML SOLN
100.0000 mL | Freq: Once | INTRAVENOUS | Status: AC | PRN
Start: 2022-02-19 — End: 2022-02-19
  Administered 2022-02-19: 100 mL via INTRAVENOUS

## 2022-02-19 MED ORDER — ETOMIDATE 2 MG/ML IV SOLN
INTRAVENOUS | Status: DC | PRN
  Administered 2022-02-19: 18 mg via INTRAVENOUS

## 2022-02-19 MED ORDER — NOREPINEPHRINE 4 MG/250ML-% IV SOLN
INTRAVENOUS | Status: AC
  Filled 2022-02-19: qty 250

## 2022-02-19 MED ORDER — ROCURONIUM BROMIDE 10 MG/ML (PF) SYRINGE
PREFILLED_SYRINGE | INTRAVENOUS | Status: AC
  Filled 2022-02-19: qty 10

## 2022-02-19 MED ORDER — OXYCODONE HCL 5 MG PO TABS
5.0000 mg | ORAL_TABLET | ORAL | Status: DC | PRN
  Administered 2022-02-20: 10 mg
  Filled 2022-02-19: qty 2

## 2022-02-19 MED ORDER — ACETAMINOPHEN 10 MG/ML IV SOLN
1000.0000 mg | Freq: Four times a day (QID) | INTRAVENOUS | Status: DC
  Filled 2022-02-19: qty 100

## 2022-02-19 MED ORDER — METHOCARBAMOL 1000 MG/10ML IJ SOLN: 500.0000 mg | Freq: Three times a day (TID) | INTRAVENOUS | Status: DC | PRN

## 2022-02-19 MED ORDER — PANTOPRAZOLE 2 MG/ML SUSPENSION
40.0000 mg | Freq: Every day | ORAL | Status: DC
  Administered 2022-02-20 – 2022-02-24 (×5): 40 mg
  Filled 2022-02-19 (×5): qty 20

## 2022-02-19 MED ORDER — FENTANYL BOLUS VIA INFUSION
25.0000 ug | INTRAVENOUS | Status: DC | PRN
  Administered 2022-02-21: 50 ug via INTRAVENOUS
  Administered 2022-02-21 (×3): 25 ug via INTRAVENOUS
  Administered 2022-02-21: 50 ug via INTRAVENOUS
  Administered 2022-02-21: 25 ug via INTRAVENOUS
  Administered 2022-02-21: 50 ug via INTRAVENOUS
  Administered 2022-02-21 – 2022-02-22 (×2): 25 ug via INTRAVENOUS
  Administered 2022-02-22: 50 ug via INTRAVENOUS
  Administered 2022-02-22 (×2): 100 ug via INTRAVENOUS
  Administered 2022-02-22: 25 ug via INTRAVENOUS
  Administered 2022-02-22: 100 ug via INTRAVENOUS
  Administered 2022-02-22: 50 ug via INTRAVENOUS
  Administered 2022-02-22: 25 ug via INTRAVENOUS
  Administered 2022-02-22 (×3): 50 ug via INTRAVENOUS
  Administered 2022-02-22: 25 ug via INTRAVENOUS
  Administered 2022-02-22: 50 ug via INTRAVENOUS
  Administered 2022-02-22 (×3): 25 ug via INTRAVENOUS
  Administered 2022-02-23: 100 ug via INTRAVENOUS
  Administered 2022-02-23 (×3): 50 ug via INTRAVENOUS
  Administered 2022-02-23 (×2): 25 ug via INTRAVENOUS
  Administered 2022-02-23: 50 ug via INTRAVENOUS
  Administered 2022-02-24: 100 ug via INTRAVENOUS
  Administered 2022-02-24: 50 ug via INTRAVENOUS
  Administered 2022-02-24 – 2022-02-26 (×11): 100 ug via INTRAVENOUS
  Administered 2022-02-26: 50 ug via INTRAVENOUS
  Administered 2022-02-26 (×2): 100 ug via INTRAVENOUS

## 2022-02-19 MED ORDER — ACETAMINOPHEN 160 MG/5ML PO SOLN
650.0000 mg | Freq: Four times a day (QID) | ORAL | Status: DC
Start: 2022-02-19 — End: 2022-02-20
  Administered 2022-02-19 (×2): 650 mg
  Filled 2022-02-19 (×2): qty 20.3

## 2022-02-19 MED ORDER — ORAL CARE MOUTH RINSE: 15.0000 mL | OROMUCOSAL | Status: DC | PRN

## 2022-02-19 MED ORDER — METHOCARBAMOL 500 MG PO TABS: 500.0000 mg | ORAL_TABLET | Freq: Three times a day (TID) | ORAL | Status: DC | PRN

## 2022-02-19 MED ORDER — PROSOURCE TF PO LIQD
45.0000 mL | Freq: Two times a day (BID) | ORAL | Status: DC
  Administered 2022-02-19: 45 mL
  Filled 2022-02-19: qty 45

## 2022-02-19 MED ORDER — DOCUSATE SODIUM 50 MG/5ML PO LIQD
100.0000 mg | Freq: Two times a day (BID) | ORAL | Status: DC
Start: 2022-02-19 — End: 2022-02-25
  Administered 2022-02-19 – 2022-02-24 (×9): 100 mg
  Filled 2022-02-19 (×9): qty 10

## 2022-02-19 MED ORDER — PANTOPRAZOLE SODIUM 40 MG IV SOLR
40.0000 mg | INTRAVENOUS | Status: DC
  Filled 2022-02-19: qty 10

## 2022-02-19 MED ORDER — PIVOT 1.5 CAL PO LIQD
1000.0000 mL | ORAL | Status: DC
  Administered 2022-02-19 – 2022-02-22 (×5): 1000 mL

## 2022-02-19 MED ORDER — SODIUM CHLORIDE 0.9 % IV SOLN
250.0000 mL | INTRAVENOUS | Status: DC
  Administered 2022-02-24 – 2022-03-01 (×2): 250 mL via INTRAVENOUS

## 2022-02-19 MED ORDER — CHLORHEXIDINE GLUCONATE CLOTH 2 % EX PADS
6.0000 | MEDICATED_PAD | Freq: Every day | CUTANEOUS | Status: DC
  Administered 2022-02-19 – 2022-03-04 (×14): 6 via TOPICAL

## 2022-02-19 MED ORDER — ETOMIDATE 2 MG/ML IV SOLN
INTRAVENOUS | Status: AC
  Filled 2022-02-19: qty 20

## 2022-02-19 MED ORDER — PANTOPRAZOLE 2 MG/ML SUSPENSION
40.0000 mg | Freq: Every day | ORAL | Status: DC
Start: 2022-02-19 — End: 2022-02-19
  Administered 2022-02-19: 40 mg
  Filled 2022-02-19: qty 20
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
## 2022-02-19 NOTE — Progress Notes (Signed)
Patient's niece [facility]) dropped off copies of patient's healthcare power of attorney, living will, and patient authorization paperwork. I will put them in the patient's paper chart  [name] RN

## 2022-02-19 NOTE — Progress Notes (Signed)
Trauma Event Note  Pt decompensated this morning.  Obvious respiratory distress noted and mental status altered.  Pt was transferred from 4NP to 4NICU and intubated for airway protection due to mental status.  ABG drawn and WNL.    18G PIV started to L upper arm.  Pt taken to CT for CTA of head, neck and chest.  Pt returned to room without complications. Awaiting results.  Last imported Vital Signs BP 110/74   Pulse 98   Temp (!) 97.4 F (36.3 C) (Axillary)   Resp 20   Ht '6\' 1"'$  (1.854 m)   Wt 220 lb (99.8 kg)   SpO2 100%   BMI 29.03 kg/m   Trending CBC Recent Labs    03/05/2022 0928 02/10/2022 0933 02/19/22 0318  WBC 6.8  --  10.3  HGB 14.5 13.9 12.8*  HCT 40.5 41.0 36.6*  PLT 145*  --  127*    Trending Coag's Recent Labs    03/08/2022 0928  INR 1.1    Trending BMET Recent Labs    02/08/2022 0928 03/01/2022 0933 02/19/22 0318  NA 137 139 139  K 3.8 3.9 4.0  CL 108 105 108  CO2 21*  --  22  BUN '14 16 18  '$ CREATININE 0.65 0.60* 0.71  GLUCOSE 140* 137* 124*     Rykar Lebleu W  Trauma Response RN  Please call TRN at [phone] for further assistance.
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
## 2022-02-19 NOTE — Progress Notes (Signed)
RT x3 attempted to place/maintain arterial line but was unsuccessful. MD aware.

## 2022-02-19 NOTE — Progress Notes (Signed)
Patient seen and examined. Significantly more somnolent with neck and chest retractions with respiration. Incomprehensible sounds to deep sternal rub. ABG previously ordered reviewed and normal, but decision made for intubation due to AMS and impending respiratory failure. I requested intubation by anesthesia due to known ankylosing spondylitis and T1-3 fx. Single attempt, grade 1 view, no significant abnormalities noted on glidescope. Fent/prop for analgesia/sedation.   Critical care time: 47mn  AJesusita Oka MD General and [facility]

## 2022-02-19 NOTE — Progress Notes (Signed)
Pt appears to be having increased periods of apnea and increased WOB. Increased O2 to 5L with humidifier O2 sat 95-97%. Responding to pain.  Respiratory called to obtain ABG, rapid called to bedside due to pt decrease in LOC and increased WOB.   Dr. Bobbye Morton at bedside. Verbal order given to transport to ICU. Transported to 4N-ICU 25 with rapid response nurse. Report given at bedside.
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
## 2022-02-19 NOTE — Progress Notes (Signed)
An USGPIV (ultrasound guided PIV) has been placed on LAFA 20g 1.75"for short-term vasopressor infusion. A correctly placed ivWatch must be used when administering Vasopressors. Should this treatment be needed beyond 72 hours, central line access should be obtained.  It will be the responsibility of the bedside nurse to follow best practice to prevent extravasations.

## 2022-02-19 NOTE — Procedures (Signed)
Cortrak  Person Inserting Tube:  Ranell Patrick D, RD Tube Type:  Cortrak - 43 inches Tube Size:  10 Tube Location:  Left nare Secured by: Bridle Technique Used to Measure Tube Placement:  Marking at nare/corner of mouth Cortrak Secured At:  83 cm  Cortrak Tube Team Note:  Consult received to place a Cortrak feeding tube.   X-ray is required, abdominal x-ray has been ordered by the Cortrak team. Please confirm tube placement before using the Cortrak tube.   If the tube becomes dislodged please keep the tube and contact the Cortrak team at [email or web address] (password [email or web address]) for replacement.  If after hours and replacement cannot be delayed, place a NG tube and confirm placement with an abdominal x-ray.    Ranell Patrick, RD, LDN Clinical Dietitian RD pager # available in [place]  After hours/weekend pager # available in [facility]

## 2022-02-19 NOTE — Progress Notes (Signed)
Pt noted to be snoring loudly with periods apnea. Pt awakes to voice oriented x 2. Continues to be agitated when awakened. Dr. Bobbye Morton at bedside see new orders.

## 2022-02-19 NOTE — Progress Notes (Signed)
Update provided to patient's niece via phone, Anne Hahn, who reports she is the patient's HC-POA. She reports the patient has an advance directive and will provide copies of each when she comes back to the hospital later today.   Jesusita Oka, MD General and [facility]

## 2022-02-19 NOTE — Anesthesia Procedure Notes (Addendum)
Procedure Name: Intubation Date/Time: 02/19/2022 11:13 AM  Performed by: Wilburn Cornelia, CRNAPre-anesthesia Checklist: Patient identified, Emergency Drugs available, Patient being monitored, Suction available and Timeout performed Patient Re-evaluated:Patient Re-evaluated prior to induction Oxygen Delivery Method: Circle system utilized Preoxygenation: Pre-oxygenation with 100% oxygen Induction Type: IV induction and Rapid sequence Ventilation: Mask ventilation without difficulty Laryngoscope Size: Mac and 4 Grade View: Grade I Tube type: Oral Tube size: 8.0 mm Number of attempts: 1 Airway Equipment and Method: Video-laryngoscopy and Rigid stylet Placement Confirmation: ETT inserted through vocal cords under direct vision, positive ETCO2, CO2 detector and breath sounds checked- equal and bilateral Secured at: 24 cm Tube secured with: Tape Dental Injury: Teeth and Oropharynx as per pre-operative assessment

## 2022-02-19 NOTE — Significant Event (Signed)
Rapid Response Event Note   Reason for Call :  Somnolence, irregular respiratory pattern  Interventions:  -MD at bedside- ABG obtained and pt transferred to ICU  Plan of Care:  -Transfer to ICU  Event Summary:  MD Notified: Dr. Bobbye Morton Call Time: 9847 Arrival Time: 3085 End Time: [place], RN

## 2022-02-20 ENCOUNTER — Inpatient Hospital Stay (HOSPITAL_COMMUNITY): Payer: Medicare Other

## 2022-02-20 DIAGNOSIS — E440 Moderate protein-calorie malnutrition: Secondary | ICD-10-CM | POA: Insufficient documentation

## 2022-02-20 LAB — COMPREHENSIVE METABOLIC PANEL
ALT: 37 U/L (ref 0–44)
AST: 76 U/L — ABNORMAL HIGH (ref 15–41)
Albumin: 3.1 g/dL — ABNORMAL LOW (ref 3.5–5.0)
Alkaline Phosphatase: 69 U/L (ref 38–126)
Anion gap: 8 (ref 5–15)
BUN: 24 mg/dL — ABNORMAL HIGH (ref 8–23)
CO2: 24 mmol/L (ref 22–32)
Calcium: 8.2 mg/dL — ABNORMAL LOW (ref 8.9–10.3)
Chloride: 109 mmol/L (ref 98–111)
Creatinine, Ser: 0.69 mg/dL (ref 0.61–1.24)
GFR, Estimated: >60 mL/min (ref >60)
Glucose, Bld: 125 mg/dL — ABNORMAL HIGH (ref 70–99)
Potassium: 3.4 mmol/L — ABNORMAL LOW (ref 3.5–5.1)
Sodium: 141 mmol/L (ref 135–145)
Total Bilirubin: 3.6 mg/dL — ABNORMAL HIGH (ref 0.3–1.2)
Total Protein: 6 g/dL — ABNORMAL LOW (ref 6.5–8.1)

## 2022-02-20 LAB — CBC
HCT: 33 % — ABNORMAL LOW (ref 39.0–52.0)
Hemoglobin: 11.6 g/dL — ABNORMAL LOW (ref 13.0–17.0)
MCH: 32.2 pg (ref 26.0–34.0)
MCHC: 35.2 g/dL (ref 30.0–36.0)
MCV: 91.7 fL (ref 80.0–100.0)
Platelets: 105 10*3/uL — ABNORMAL LOW (ref 150–400)
RBC: 3.6 MIL/uL — ABNORMAL LOW (ref 4.22–5.81)
RDW: 13.3 % (ref 11.5–15.5)
WBC: 8.5 10*3/uL (ref 4.0–10.5)
nRBC: 0 % (ref 0.0–0.2)

## 2022-02-20 LAB — GLUCOSE, CAPILLARY
Glucose-Capillary: 128 mg/dL — ABNORMAL HIGH (ref 70–99)
Glucose-Capillary: 108 mg/dL — ABNORMAL HIGH (ref 70–99)
Glucose-Capillary: 131 mg/dL — ABNORMAL HIGH (ref 70–99)
Glucose-Capillary: 114 mg/dL — ABNORMAL HIGH (ref 70–99)
Glucose-Capillary: 109 mg/dL — ABNORMAL HIGH (ref 70–99)

## 2022-02-20 LAB — TRIGLYCERIDES: Triglycerides: 53 mg/dL (ref <150)

## 2022-02-20 MED ORDER — POTASSIUM CHLORIDE 20 MEQ PO PACK
40.0000 meq | PACK | Freq: Once | ORAL | Status: AC
  Administered 2022-02-20: 40 meq via ORAL
  Filled 2022-02-20: qty 2

## 2022-02-20 MED ORDER — IOHEXOL 350 MG/ML SOLN
100.0000 mL | Freq: Once | INTRAVENOUS | Status: AC | PRN
  Administered 2022-02-20: 100 mL via INTRAVENOUS

## 2022-02-20 MED ORDER — LACTULOSE 10 GM/15ML PO SOLN
30.0000 g | Freq: Every day | ORAL | Status: DC
  Administered 2022-02-20: 30 g
  Filled 2022-02-20: qty 45

## 2022-02-20 MED ORDER — ACETAMINOPHEN 160 MG/5ML PO SOLN
650.0000 mg | Freq: Four times a day (QID) | ORAL | Status: DC | PRN
Start: 2022-02-20 — End: 2022-03-05
  Administered 2022-02-21 – 2022-03-04 (×7): 650 mg
  Filled 2022-02-20 (×7): qty 20.3

## 2022-02-20 NOTE — Progress Notes (Addendum)
Dr Bobbye Morton notified that RT could not get ART line. She said she will come and do it. ART line supplies at bedside. MD also notified that patient's BP is labile and requiring pressor support. Peripheral levophed ordered.  [name] RN

## 2022-02-20 NOTE — Progress Notes (Signed)
Patient ID: Antonio Ayers, male   DOB: 04-Nov-1950, 71 y.o.   MRN: [id number] Patient is clinical deterioration noted yesterday.  CT scan of the head remains intact and normal.  Appreciate concern for mediastinal hematoma.  Thoracic spine fractures remain stable and unchanged.  We will continue to observe with you.

## 2022-02-20 NOTE — Progress Notes (Signed)
OT Cancellation Note  Patient Details Name: Antonio Ayers MRN: [id number] DOB: [birth year]   Cancelled Treatment:    Reason Eval/Treat Not Completed: Patient not medically ready. RN requesting hold. Pt noted to be intubated and ICU at this time.   Jeri Modena 02/20/2022, 12:02 PM
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
## 2022-02-21 ENCOUNTER — Other Ambulatory Visit: Payer: Self-pay

## 2022-02-21 LAB — GLUCOSE, CAPILLARY
Glucose-Capillary: 105 mg/dL — ABNORMAL HIGH (ref 70–99)
Glucose-Capillary: 112 mg/dL — ABNORMAL HIGH (ref 70–99)
Glucose-Capillary: 113 mg/dL — ABNORMAL HIGH (ref 70–99)
Glucose-Capillary: 115 mg/dL — ABNORMAL HIGH (ref 70–99)
Glucose-Capillary: 125 mg/dL — ABNORMAL HIGH (ref 70–99)
Glucose-Capillary: 158 mg/dL — ABNORMAL HIGH (ref 70–99)
Glucose-Capillary: 87 mg/dL (ref 70–99)

## 2022-02-21 LAB — MAGNESIUM: Magnesium: 1.9 mg/dL (ref 1.7–2.4)

## 2022-02-21 LAB — COMPREHENSIVE METABOLIC PANEL
ALT: 29 U/L (ref 0–44)
AST: 45 U/L — ABNORMAL HIGH (ref 15–41)
Albumin: 2.8 g/dL — ABNORMAL LOW (ref 3.5–5.0)
Alkaline Phosphatase: 66 U/L (ref 38–126)
Anion gap: 7 (ref 5–15)
BUN: 24 mg/dL — ABNORMAL HIGH (ref 8–23)
CO2: 23 mmol/L (ref 22–32)
Calcium: 8.2 mg/dL — ABNORMAL LOW (ref 8.9–10.3)
Chloride: 111 mmol/L (ref 98–111)
Creatinine, Ser: 0.68 mg/dL (ref 0.61–1.24)
GFR, Estimated: >60 mL/min (ref >60)
Glucose, Bld: 137 mg/dL — ABNORMAL HIGH (ref 70–99)
Potassium: 4 mmol/L (ref 3.5–5.1)
Sodium: 141 mmol/L (ref 135–145)
Total Bilirubin: 2.3 mg/dL — ABNORMAL HIGH (ref 0.3–1.2)
Total Protein: 5.7 g/dL — ABNORMAL LOW (ref 6.5–8.1)

## 2022-02-21 LAB — CBC
HCT: 32.3 % — ABNORMAL LOW (ref 39.0–52.0)
Hemoglobin: 11.5 g/dL — ABNORMAL LOW (ref 13.0–17.0)
MCH: 32.3 pg (ref 26.0–34.0)
MCHC: 35.6 g/dL (ref 30.0–36.0)
MCV: 90.7 fL (ref 80.0–100.0)
Platelets: 112 10*3/uL — ABNORMAL LOW (ref 150–400)
RBC: 3.56 MIL/uL — ABNORMAL LOW (ref 4.22–5.81)
RDW: 13.6 % (ref 11.5–15.5)
WBC: 10.5 10*3/uL (ref 4.0–10.5)
nRBC: 0 % (ref 0.0–0.2)

## 2022-02-21 LAB — MRSA NEXT GEN BY PCR, NASAL: MRSA by PCR Next Gen: NOT DETECTED

## 2022-02-21 MED ORDER — DEXMEDETOMIDINE HCL IN NACL 400 MCG/100ML IV SOLN
0.4000 ug/kg/h | INTRAVENOUS | Status: DC
  Administered 2022-02-21: 0.4 ug/kg/h via INTRAVENOUS
  Administered 2022-02-21: 0.7 ug/kg/h via INTRAVENOUS
  Administered 2022-02-21 – 2022-02-22 (×3): 0.9 ug/kg/h via INTRAVENOUS
  Administered 2022-02-22 (×4): 1 ug/kg/h via INTRAVENOUS
  Administered 2022-02-23: 1.3 ug/kg/h via INTRAVENOUS
  Administered 2022-02-23: 1.6 ug/kg/h via INTRAVENOUS
  Administered 2022-02-23: 1 ug/kg/h via INTRAVENOUS
  Administered 2022-02-23: 1.2 ug/kg/h via INTRAVENOUS
  Administered 2022-02-23: 1 ug/kg/h via INTRAVENOUS
  Administered 2022-02-23: 2 ug/kg/h via INTRAVENOUS
  Administered 2022-02-23 – 2022-02-24 (×2): 1.7 ug/kg/h via INTRAVENOUS
  Administered 2022-02-24: 1.3 ug/kg/h via INTRAVENOUS
  Administered 2022-02-24: 1.2 ug/kg/h via INTRAVENOUS
  Administered 2022-02-24: 1.5 ug/kg/h via INTRAVENOUS
  Administered 2022-02-24: 1.3 ug/kg/h via INTRAVENOUS
  Administered 2022-02-24 (×2): 1.5 ug/kg/h via INTRAVENOUS
  Administered 2022-02-24: 1.3 ug/kg/h via INTRAVENOUS
  Administered 2022-02-25: 1.7 ug/kg/h via INTRAVENOUS
  Administered 2022-02-25: 1.5 ug/kg/h via INTRAVENOUS
  Administered 2022-02-25 (×2): 1.6 ug/kg/h via INTRAVENOUS
  Administered 2022-02-25: 1.2 ug/kg/h via INTRAVENOUS
  Administered 2022-02-25: 1.5 ug/kg/h via INTRAVENOUS
  Administered 2022-02-25: 1.2 ug/kg/h via INTRAVENOUS
  Administered 2022-02-25: 1.5 ug/kg/h via INTRAVENOUS
  Filled 2022-02-21: qty 100
  Filled 2022-02-21: qty 200
  Filled 2022-02-21 (×21): qty 100
  Filled 2022-02-21: qty 200
  Filled 2022-02-21 (×6): qty 100

## 2022-02-21 MED ORDER — METOPROLOL TARTRATE 5 MG/5ML IV SOLN
5.0000 mg | Freq: Once | INTRAVENOUS | Status: AC
  Administered 2022-02-21: 5 mg via INTRAVENOUS
  Filled 2022-02-21: qty 5

## 2022-02-21 MED ORDER — VANCOMYCIN HCL 2000 MG/400ML IV SOLN
2000.0000 mg | Freq: Once | INTRAVENOUS | Status: AC
  Administered 2022-02-21: 2000 mg via INTRAVENOUS
  Filled 2022-02-21: qty 400

## 2022-02-21 MED ORDER — BRIMONIDINE TARTRATE 0.15 % OP SOLN
1.0000 [drp] | Freq: Two times a day (BID) | OPHTHALMIC | Status: DC
  Administered 2022-02-21 – 2022-03-05 (×25): 1 [drp] via OPHTHALMIC
  Filled 2022-02-21: qty 5

## 2022-02-21 MED ORDER — DORZOLAMIDE HCL 2 % OP SOLN
1.0000 [drp] | Freq: Two times a day (BID) | OPHTHALMIC | Status: DC
Start: 2022-02-21 — End: 2022-03-06
  Administered 2022-02-21 – 2022-03-05 (×25): 1 [drp] via OPHTHALMIC
  Filled 2022-02-21: qty 10

## 2022-02-21 MED ORDER — VANCOMYCIN HCL 1250 MG/250ML IV SOLN: 1250.0000 mg | Freq: Two times a day (BID) | INTRAVENOUS | Status: DC

## 2022-02-21 MED ORDER — LACTULOSE 10 GM/15ML PO SOLN
30.0000 g | Freq: Two times a day (BID) | ORAL | Status: DC
  Administered 2022-02-21 – 2022-02-24 (×7): 30 g
  Filled 2022-02-21 (×8): qty 45

## 2022-02-21 MED ORDER — LATANOPROST 0.005 % OP SOLN
1.0000 [drp] | Freq: Every day | OPHTHALMIC | Status: DC
  Administered 2022-02-21 – 2022-03-05 (×12): 1 [drp] via OPHTHALMIC
  Filled 2022-02-21: qty 2.5

## 2022-02-21 MED ORDER — SODIUM CHLORIDE 0.9 % IV SOLN
2.0000 g | Freq: Three times a day (TID) | INTRAVENOUS | Status: DC
  Administered 2022-02-21 – 2022-02-23 (×6): 2 g via INTRAVENOUS
  Filled 2022-02-21 (×6): qty 12.5

## 2022-02-21 MED ORDER — METOPROLOL TARTRATE 5 MG/5ML IV SOLN
5.0000 mg | Freq: Four times a day (QID) | INTRAVENOUS | Status: DC | PRN
  Administered 2022-02-21 – 2022-02-25 (×3): 5 mg via INTRAVENOUS
  Filled 2022-02-21 (×5): qty 5
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
## 2022-02-21 NOTE — Progress Notes (Signed)
Wean terminated and placed back on full support due to increased HR >135 and excessive agitation.
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
## 2022-02-21 NOTE — Plan of Care (Signed)
  Problem: Education: Goal: Knowledge of General Education information will improve Description: Including pain rating scale, medication(s)/side effects and non-pharmacologic comfort measures Outcome: Progressing   Problem: Health Behavior/Discharge Planning: Goal: Ability to manage health-related needs will improve Outcome: Progressing   Problem: Clinical Measurements: Goal: Ability to maintain clinical measurements within normal limits will improve Outcome: Progressing   Problem: Activity: Goal: Risk for activity intolerance will decrease Outcome: Progressing   Problem: Nutrition: Goal: Adequate nutrition will be maintained Outcome: Progressing   Problem: Coping: Goal: Level of anxiety will decrease Outcome: Progressing   Problem: Elimination: Goal: Will not experience complications related to bowel motility Outcome: Progressing   Problem: Skin Integrity: Goal: Risk for impaired skin integrity will decrease Outcome: Progressing   Problem: Safety: Goal: Non-violent Restraint(s) Outcome: Progressing

## 2022-02-21 NOTE — Progress Notes (Addendum)
0915 patient flipped to full support on vent; HR in 130s, pt agitated, pt with copious secretions  0950 HR sustaining in 130s, per MD obtain EKG. During EKG, HR increased into 150s. Afib RVR. Order for '5mg'$  metoprolol now and PRN order modified.  HR responded to metoprolol. Order for precedex gtt.   1100 Patient with cuff leak, RT to bedside, HME exchanged. Respiratory culture obtained at this time.   MD S. Allen aware of events.   1630 Updated MD. Zenia Resides pt HR up to 180s at 1620 (time for metop and given with success).  [place] with MD Zenia Resides while rounding bedside okay to use sedation for HR rate control when patient agitated. If sustained use PRN metop or call for orders.

## 2022-02-21 NOTE — Progress Notes (Signed)
NEUROSURGERY PROGRESS NOTE  S/p thoracic spine fractures, still intubated and sedated. No new nsgy recommendations.   Temp:  [98.1 F (36.7 C)-100.6 F (38.1 C)] 100.6 F (38.1 C) (07/16 0400) Pulse Rate:  [83-116] 105 (07/16 0615) Resp:  [7-33] 20 (07/16 0615) BP: (95-161)/(61-96) 115/68 (07/16 0615) SpO2:  [92 %-100 %] 100 % (07/16 0747) FiO2 (%):  [40 %] 40 % (07/16 0747)    Eleonore Chiquito, NP 02/21/2022 7:59 AM

## 2022-02-22 ENCOUNTER — Inpatient Hospital Stay: Payer: Self-pay

## 2022-02-22 ENCOUNTER — Other Ambulatory Visit: Payer: Self-pay

## 2022-02-22 ENCOUNTER — Encounter (HOSPITAL_COMMUNITY): Payer: Medicare Other

## 2022-02-22 ENCOUNTER — Inpatient Hospital Stay (HOSPITAL_COMMUNITY): Payer: Medicare Other

## 2022-02-22 LAB — CBC
HCT: 34.6 % — ABNORMAL LOW (ref 39.0–52.0)
Hemoglobin: 12.1 g/dL — ABNORMAL LOW (ref 13.0–17.0)
MCH: 32.6 pg (ref 26.0–34.0)
MCHC: 35 g/dL (ref 30.0–36.0)
MCV: 93.3 fL (ref 80.0–100.0)
Platelets: 130 10*3/uL — ABNORMAL LOW (ref 150–400)
RBC: 3.71 MIL/uL — ABNORMAL LOW (ref 4.22–5.81)
RDW: 13.4 % (ref 11.5–15.5)
WBC: 13.6 10*3/uL — ABNORMAL HIGH (ref 4.0–10.5)
nRBC: 0 % (ref 0.0–0.2)

## 2022-02-22 LAB — URINALYSIS, ROUTINE W REFLEX MICROSCOPIC
Bilirubin Urine: NEGATIVE
Glucose, UA: NEGATIVE mg/dL
Ketones, ur: NEGATIVE mg/dL
Nitrite: NEGATIVE
Protein, ur: 100 mg/dL — AB
Specific Gravity, Urine: 1.033 — ABNORMAL HIGH (ref 1.005–1.030)
WBC, UA: >50 WBC/hpf — ABNORMAL HIGH (ref 0–5)
pH: 5 (ref 5.0–8.0)

## 2022-02-22 LAB — GLUCOSE, CAPILLARY
Glucose-Capillary: 133 mg/dL — ABNORMAL HIGH (ref 70–99)
Glucose-Capillary: 147 mg/dL — ABNORMAL HIGH (ref 70–99)
Glucose-Capillary: 152 mg/dL — ABNORMAL HIGH (ref 70–99)
Glucose-Capillary: 153 mg/dL — ABNORMAL HIGH (ref 70–99)
Glucose-Capillary: 162 mg/dL — ABNORMAL HIGH (ref 70–99)
Glucose-Capillary: 178 mg/dL — ABNORMAL HIGH (ref 70–99)

## 2022-02-22 LAB — COMPREHENSIVE METABOLIC PANEL
ALT: 26 U/L (ref 0–44)
AST: 30 U/L (ref 15–41)
Albumin: 2.5 g/dL — ABNORMAL LOW (ref 3.5–5.0)
Alkaline Phosphatase: 72 U/L (ref 38–126)
Anion gap: 10 (ref 5–15)
BUN: 22 mg/dL (ref 8–23)
CO2: 21 mmol/L — ABNORMAL LOW (ref 22–32)
Calcium: 8.2 mg/dL — ABNORMAL LOW (ref 8.9–10.3)
Chloride: 111 mmol/L (ref 98–111)
Creatinine, Ser: 0.6 mg/dL — ABNORMAL LOW (ref 0.61–1.24)
GFR, Estimated: >60 mL/min (ref >60)
Glucose, Bld: 175 mg/dL — ABNORMAL HIGH (ref 70–99)
Potassium: 3.3 mmol/L — ABNORMAL LOW (ref 3.5–5.1)
Sodium: 142 mmol/L (ref 135–145)
Total Bilirubin: 2.8 mg/dL — ABNORMAL HIGH (ref 0.3–1.2)
Total Protein: 5.9 g/dL — ABNORMAL LOW (ref 6.5–8.1)

## 2022-02-22 LAB — HEMOGLOBIN A1C
Hgb A1c MFr Bld: 4.9 % (ref 4.8–5.6)
Mean Plasma Glucose: 93.93 mg/dL

## 2022-02-22 LAB — AMMONIA: Ammonia: 39 umol/L — ABNORMAL HIGH (ref 9–35)

## 2022-02-22 MED ORDER — LACTATED RINGERS IV BOLUS
1000.0000 mL | Freq: Once | INTRAVENOUS | Status: AC
Start: 2022-02-22 — End: 2022-02-22
  Administered 2022-02-22: 1000 mL via INTRAVENOUS

## 2022-02-22 MED ORDER — SODIUM CHLORIDE 0.9% FLUSH
10.0000 mL | Freq: Two times a day (BID) | INTRAVENOUS | Status: DC
  Administered 2022-02-22: 30 mL
  Administered 2022-02-23 – 2022-02-28 (×8): 10 mL
  Administered 2022-03-01 (×2): 20 mL
  Administered 2022-03-02: 30 mL
  Administered 2022-03-02: 10 mL
  Administered 2022-03-03: 30 mL
  Administered 2022-03-04: 20 mL

## 2022-02-22 MED ORDER — SODIUM CHLORIDE 0.9% FLUSH: 10.0000 mL | INTRAVENOUS | Status: DC | PRN

## 2022-02-22 MED ORDER — ENOXAPARIN SODIUM 30 MG/0.3ML IJ SOSY
30.0000 mg | PREFILLED_SYRINGE | Freq: Two times a day (BID) | INTRAMUSCULAR | Status: DC
Start: 2022-02-23 — End: 2022-03-06
  Administered 2022-02-23 – 2022-03-05 (×21): 30 mg via SUBCUTANEOUS
  Filled 2022-02-22 (×21): qty 0.3

## 2022-02-22 MED ORDER — POTASSIUM CHLORIDE 20 MEQ PO PACK
40.0000 meq | PACK | ORAL | Status: AC
  Administered 2022-02-22 (×2): 40 meq
  Filled 2022-02-22 (×2): qty 2

## 2022-02-22 MED ORDER — ENOXAPARIN SODIUM 30 MG/0.3ML IJ SOSY
30.0000 mg | PREFILLED_SYRINGE | Freq: Once | INTRAMUSCULAR | Status: AC
  Administered 2022-02-22: 30 mg via SUBCUTANEOUS
  Filled 2022-02-22: qty 0.3

## 2022-02-22 MED ORDER — NOREPINEPHRINE 4 MG/250ML-% IV SOLN
0.0000 ug/min | INTRAVENOUS | Status: DC
  Administered 2022-02-22: 3 ug/min via INTRAVENOUS
  Administered 2022-02-23: 2 ug/min via INTRAVENOUS
  Filled 2022-02-22: qty 250

## 2022-02-22 MED ORDER — INSULIN ASPART 100 UNIT/ML IJ SOLN
0.0000 [IU] | INTRAMUSCULAR | Status: DC
  Administered 2022-02-22 (×2): 3 [IU] via SUBCUTANEOUS
  Administered 2022-02-22 – 2022-02-23 (×5): 2 [IU] via SUBCUTANEOUS
  Administered 2022-02-23: 3 [IU] via SUBCUTANEOUS
  Administered 2022-02-23: 2 [IU] via SUBCUTANEOUS

## 2022-02-22 MED ORDER — METOPROLOL TARTRATE 5 MG/5ML IV SOLN
5.0000 mg | INTRAVENOUS | Status: DC | PRN
  Administered 2022-02-25 – 2022-03-05 (×2): 5 mg via INTRAVENOUS

## 2022-02-22 MED ORDER — LACTATED RINGERS IV BOLUS
1000.0000 mL | Freq: Once | INTRAVENOUS | Status: AC
  Administered 2022-02-22: 1000 mL via INTRAVENOUS

## 2022-02-22 MED ORDER — METOPROLOL TARTRATE 5 MG/5ML IV SOLN: 5.0000 mg | INTRAVENOUS | Status: DC | PRN

## 2022-02-22 NOTE — Progress Notes (Signed)
02/22/22 0830  Provider Notification  Provider Name/Title A. Lovick  Date Provider Notified 02/22/22  Time Provider Notified 0830  Method of Notification Face-to-face (AM rounds)  Notification Reason Change in status (patient progressed to A.flutter)  Provider response See new orders (12 lead)  Date of Provider Response 02/22/22  Time of Provider Response 0830                 02/22/22 1200  Provider Notification  Provider Name/Title A. Lovick MD  Date Provider Notified 02/22/22  Time Provider Notified 1200  Method of Notification Page  Notification Reason Other (Comment) (UOP only 138m since 0700)  Provider response See new orders (LR bolus 5024m  Date of Provider Response 02/22/22  Time of Provider Response 1200

## 2022-02-22 NOTE — Progress Notes (Signed)
Patient ID: Antonio Ayers, male   DOB: 03-Jul-1951, 71 y.o.   MRN: [id number] Continue supportive care, no new change from neuro standpoint.

## 2022-02-22 NOTE — Progress Notes (Signed)
PT Cancellation Note  Patient Details Name: Antonio Ayers MRN: [id number] DOB: 05-Apr-1951   Cancelled Treatment:    Reason Eval/Treat Not Completed: Medical issues which prohibited therapy remained throughout the day. Continued difficulty controlling HR and rhythm, will continue to follow and progress as appropriate.   West Carbo, PT, DPT   Acute Rehabilitation Department   Sandra Cockayne 02/22/2022, 4:16 PM
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
## 2022-02-22 NOTE — Progress Notes (Signed)
Peripherally Inserted Central Catheter Placement  The IV Nurse has discussed with the patient and/or persons authorized to consent for the patient, the purpose of this procedure and the potential benefits and risks involved with this procedure.  The benefits include less needle sticks, lab draws from the catheter, and the patient may be discharged home with the catheter. Risks include, but not limited to, infection, bleeding, blood clot (thrombus formation), and puncture of an artery; nerve damage and irregular heartbeat and possibility to perform a PICC exchange if needed/ordered by physician.  Alternatives to this procedure were also discussed.  Bard Power PICC patient education guide, fact sheet on infection prevention and patient information card has been provided to patient /or left at bedside.  Consent obtained via telephone with Anne Hahn  PICC Placement Documentation  PICC Triple Lumen 63/81/77 Right Basilic 46 cm 0 cm (Active)  Indication for Insertion or Continuance of Line Prolonged intravenous therapies 02/22/22 1100  Exposed Catheter (cm) 0 cm 02/22/22 1100  Site Assessment Clean, Dry, Intact 02/22/22 1100  Lumen #1 Status Flushed;Saline locked;Blood return noted 02/22/22 1100  Lumen #2 Status Flushed;Saline locked;Blood return noted 02/22/22 1100  Lumen #3 Status Flushed;Saline locked;Blood return noted 02/22/22 1100  Dressing Type Transparent 02/22/22 1100  Dressing Status Antimicrobial disc in place 02/22/22 [place] checked and tightened 02/22/22 1100  Dressing Intervention New dressing 02/22/22 1100  Dressing Change Due 03/01/22 02/22/22 1100       Holley Bouche Renee 02/22/2022, 12:00 PM

## 2022-02-22 NOTE — Progress Notes (Signed)
OT Cancellation Note  Patient Details Name: Antonio Ayers MRN: [id number] DOB: [birth year]   Cancelled Treatment:    Reason Eval/Treat Not Completed: Patient not medically ready (Discussed with RN, pt not medically ready to participate today. OT to f/u as appropriate)  Carie Kapuscinski A Anson Peddie 02/22/2022, 4:16 PM

## 2022-02-23 ENCOUNTER — Inpatient Hospital Stay (HOSPITAL_COMMUNITY): Payer: Medicare Other

## 2022-02-23 DIAGNOSIS — I4892 Unspecified atrial flutter: Secondary | ICD-10-CM

## 2022-02-23 LAB — BASIC METABOLIC PANEL
Anion gap: 7 (ref 5–15)
BUN: 32 mg/dL — ABNORMAL HIGH (ref 8–23)
CO2: 22 mmol/L (ref 22–32)
Calcium: 7.7 mg/dL — ABNORMAL LOW (ref 8.9–10.3)
Chloride: 115 mmol/L — ABNORMAL HIGH (ref 98–111)
Creatinine, Ser: 0.69 mg/dL (ref 0.61–1.24)
GFR, Estimated: >60 mL/min (ref >60)
Glucose, Bld: 158 mg/dL — ABNORMAL HIGH (ref 70–99)
Potassium: 3.3 mmol/L — ABNORMAL LOW (ref 3.5–5.1)
Sodium: 144 mmol/L (ref 135–145)

## 2022-02-23 LAB — CBC
HCT: 32.6 % — ABNORMAL LOW (ref 39.0–52.0)
Hemoglobin: 10.9 g/dL — ABNORMAL LOW (ref 13.0–17.0)
MCH: 32.2 pg (ref 26.0–34.0)
MCHC: 33.4 g/dL (ref 30.0–36.0)
MCV: 96.4 fL (ref 80.0–100.0)
Platelets: 107 10*3/uL — ABNORMAL LOW (ref 150–400)
RBC: 3.38 MIL/uL — ABNORMAL LOW (ref 4.22–5.81)
RDW: 13.9 % (ref 11.5–15.5)
WBC: 12.5 10*3/uL — ABNORMAL HIGH (ref 4.0–10.5)
nRBC: 0 % (ref 0.0–0.2)

## 2022-02-23 LAB — CULTURE, RESPIRATORY W GRAM STAIN

## 2022-02-23 LAB — GLUCOSE, CAPILLARY
Glucose-Capillary: 120 mg/dL — ABNORMAL HIGH (ref 70–99)
Glucose-Capillary: 126 mg/dL — ABNORMAL HIGH (ref 70–99)
Glucose-Capillary: 133 mg/dL — ABNORMAL HIGH (ref 70–99)
Glucose-Capillary: 133 mg/dL — ABNORMAL HIGH (ref 70–99)
Glucose-Capillary: 147 mg/dL — ABNORMAL HIGH (ref 70–99)
Glucose-Capillary: 162 mg/dL — ABNORMAL HIGH (ref 70–99)

## 2022-02-23 LAB — ECHOCARDIOGRAM LIMITED
AR max vel: 3.67 cm2
AV Peak grad: 3.6 mmHg
Ao pk vel: 0.95 m/s
Area-P 1/2: 6.83 cm2
Calc EF: 57.5 %
Height: 73 in
S' Lateral: 2.8 cm
Single Plane A2C EF: 55.1 %
Single Plane A4C EF: 58.8 %
Weight: 3463.87 oz

## 2022-02-23 LAB — TRIGLYCERIDES: Triglycerides: 79 mg/dL (ref <150)

## 2022-02-23 MED ORDER — ALBUMIN HUMAN 5 % IV SOLN
25.0000 g | Freq: Once | INTRAVENOUS | Status: AC
  Administered 2022-02-23: 25 g via INTRAVENOUS
  Filled 2022-02-23: qty 500

## 2022-02-23 MED ORDER — BISACODYL 10 MG RE SUPP
10.0000 mg | Freq: Every day | RECTAL | Status: DC
  Filled 2022-02-23: qty 1

## 2022-02-23 MED ORDER — PROPOFOL 1000 MG/100ML IV EMUL
5.0000 ug/kg/min | INTRAVENOUS | Status: DC
  Administered 2022-02-23 – 2022-02-24 (×2): 5 ug/kg/min via INTRAVENOUS
  Filled 2022-02-23: qty 100

## 2022-02-23 MED ORDER — CEFAZOLIN SODIUM-DEXTROSE 2-4 GM/100ML-% IV SOLN
2.0000 g | Freq: Three times a day (TID) | INTRAVENOUS | Status: DC
  Administered 2022-02-23 – 2022-02-24 (×3): 2 g via INTRAVENOUS
  Filled 2022-02-23 (×3): qty 100

## 2022-02-23 MED ORDER — POTASSIUM CHLORIDE 20 MEQ PO PACK
40.0000 meq | PACK | ORAL | Status: AC
  Administered 2022-02-23 (×2): 40 meq
  Filled 2022-02-23 (×2): qty 2

## 2022-02-23 MED ORDER — PROPOFOL 1000 MG/100ML IV EMUL
INTRAVENOUS | Status: AC
  Filled 2022-02-23: qty 100

## 2022-02-23 NOTE — Progress Notes (Signed)
Pt's ETT was advanced from 25 to 28@ lips per CXR.

## 2022-02-23 NOTE — Progress Notes (Signed)
RT found ETT to be at 27cm at the lip instead of 25cm. RT adjusted ETT back to 25 cm at the lip with no complications.

## 2022-02-23 NOTE — Progress Notes (Signed)
Pt temp increased to 103, Tylenol was given, Ice applied. Temp rechecked--> 103.5. (39.7) Abdomen is more distended. UO is decreased. MD notified. New orders placed: NG/OG to low wall suction, Propofol.  Several times gastric tube was attempted to place in. Xray called. Waiting on outcome.  Asynchrony, Accessory muscle use (almost agonal). No change in neuro. Temp still is 39.7C Rhonchi bilateral. Report given to upcoming nurse.

## 2022-02-23 NOTE — Progress Notes (Signed)
PT Cancellation Note  Patient Details Name: Antonio Ayers MRN: [id number] DOB: [birth year]   Cancelled Treatment:    Reason Eval/Treat Not Completed: Patient not medically ready;Fatigue/lethargy limiting ability to participate. Pt receiving substantial sedation with fentanyl and Precedex. Will follow-up another day as able.   Moishe Spice, PT, DPT Acute Rehabilitation Services  Office: [place] 02/23/2022, 4:27 PM

## 2022-02-23 NOTE — Progress Notes (Signed)
OT Cancellation Note  Patient Details Name: Antonio Ayers MRN: [id number] DOB: 1951-03-11   Cancelled Treatment:    Reason Eval/Treat Not Completed: Patient not medically ready (Pt receiving substantial sedation with fentanyl and Precedex per RN)  [name] A Cerinity Zynda 02/23/2022, 4:57 PM

## 2022-02-23 NOTE — Progress Notes (Signed)
Xray showed "Enteric tube tip overlies distal stomach or proximal duodenum". NG tube hooked up to low continuous suction per order, nothing came through the tube. NG tube pulled back 3 cm, still nothing. MD Stechschutlte made aware.
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
## 2022-02-23 NOTE — Progress Notes (Signed)
Patient ID: Antonio Ayers, male   DOB: 1951-04-06, 71 y.o.   MRN: [id number] Patient remains intubated on ventilator with heavy sedation.  His respirations currently asynchronous with the ventilator.  He is receiving substantial sedation with fentanyl and Precedex.  Continue supportive care we will observe from neurosurgical standpoint.
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
## 2022-02-23 NOTE — Progress Notes (Signed)
Severe abdominal distention.  Colonic dilation on KUB.  NG without much out.  Rectal tube removed, digital rectal exam performed with large stool burden - disimpacted.  Doesn't really explain his fevers, but is being treated for pneumonia.  Chance of small perforation from cecal distention but no free air evident on x-rays and hemodynamics stable.  Will attempt to decompress stool burden with enemas and suppositories tonight.  If hemodynamics change, may need CT abdomen/pelvis.  Felicie Morn, MD General, Bariatric and Minimally Invasive Surgery [place]

## 2022-02-24 ENCOUNTER — Inpatient Hospital Stay (HOSPITAL_COMMUNITY): Admission: RE | Admit: 2022-02-24 | Payer: Medicare Other | Source: Ambulatory Visit

## 2022-02-24 ENCOUNTER — Encounter (HOSPITAL_COMMUNITY): Payer: Self-pay | Admitting: Gastroenterology

## 2022-02-24 ENCOUNTER — Inpatient Hospital Stay (HOSPITAL_COMMUNITY): Payer: Medicare Other

## 2022-02-24 ENCOUNTER — Encounter (HOSPITAL_COMMUNITY): Admission: EM | Disposition: E | Payer: Self-pay | Source: Home / Self Care

## 2022-02-24 DIAGNOSIS — K5981 Ogilvie syndrome: Secondary | ICD-10-CM

## 2022-02-24 DIAGNOSIS — K5939 Other megacolon: Secondary | ICD-10-CM

## 2022-02-24 HISTORY — PX: COLONOSCOPY: SHX5424

## 2022-02-24 HISTORY — PX: BOWEL DECOMPRESSION: SHX5532

## 2022-02-24 LAB — CBC
HCT: 33.7 % — ABNORMAL LOW (ref 39.0–52.0)
Hemoglobin: 11.1 g/dL — ABNORMAL LOW (ref 13.0–17.0)
MCH: 31.9 pg (ref 26.0–34.0)
MCHC: 32.9 g/dL (ref 30.0–36.0)
MCV: 96.8 fL (ref 80.0–100.0)
Platelets: 129 10*3/uL — ABNORMAL LOW (ref 150–400)
RBC: 3.48 MIL/uL — ABNORMAL LOW (ref 4.22–5.81)
RDW: 14.1 % (ref 11.5–15.5)
WBC: 12.6 10*3/uL — ABNORMAL HIGH (ref 4.0–10.5)
nRBC: 0 % (ref 0.0–0.2)

## 2022-02-24 LAB — GLUCOSE, CAPILLARY
Glucose-Capillary: 93 mg/dL (ref 70–99)
Glucose-Capillary: 116 mg/dL — ABNORMAL HIGH (ref 70–99)
Glucose-Capillary: 116 mg/dL — ABNORMAL HIGH (ref 70–99)
Glucose-Capillary: 117 mg/dL — ABNORMAL HIGH (ref 70–99)
Glucose-Capillary: 114 mg/dL — ABNORMAL HIGH (ref 70–99)
Glucose-Capillary: 144 mg/dL — ABNORMAL HIGH (ref 70–99)

## 2022-02-24 LAB — BASIC METABOLIC PANEL
Anion gap: 4 — ABNORMAL LOW (ref 5–15)
BUN: 46 mg/dL — ABNORMAL HIGH (ref 8–23)
CO2: 20 mmol/L — ABNORMAL LOW (ref 22–32)
Calcium: 8 mg/dL — ABNORMAL LOW (ref 8.9–10.3)
Chloride: 118 mmol/L — ABNORMAL HIGH (ref 98–111)
Creatinine, Ser: 0.91 mg/dL (ref 0.61–1.24)
GFR, Estimated: >60 mL/min (ref >60)
Glucose, Bld: 117 mg/dL — ABNORMAL HIGH (ref 70–99)
Potassium: 4.3 mmol/L (ref 3.5–5.1)
Sodium: 142 mmol/L (ref 135–145)

## 2022-02-24 LAB — LACTIC ACID, PLASMA: Lactic Acid, Venous: 0.9 mmol/L (ref 0.5–1.9)

## 2022-02-24 LAB — MAGNESIUM: Magnesium: 2.2 mg/dL (ref 1.7–2.4)

## 2022-02-24 LAB — PHOSPHORUS: Phosphorus: 2.1 mg/dL — ABNORMAL LOW (ref 2.5–4.6)

## 2022-02-24 LAB — TRIGLYCERIDES: Triglycerides: 82 mg/dL (ref <150)

## 2022-02-24 SURGERY — COLONOSCOPY
Anesthesia: Moderate Sedation

## 2022-02-24 MED ORDER — IOHEXOL 300 MG/ML  SOLN
100.0000 mL | Freq: Once | INTRAMUSCULAR | Status: AC | PRN
Start: 2022-02-24 — End: 2022-02-24
  Administered 2022-02-24: 100 mL via INTRAVENOUS

## 2022-02-24 MED ORDER — PIPERACILLIN-TAZOBACTAM 3.375 G IVPB 30 MIN: 3.3750 g | Freq: Four times a day (QID) | INTRAVENOUS | Status: DC

## 2022-02-24 MED ORDER — SODIUM PHOSPHATES 45 MMOLE/15ML IV SOLN
25.0000 mmol | Freq: Once | INTRAVENOUS | Status: AC
  Administered 2022-02-24: 25 mmol via INTRAVENOUS
  Filled 2022-02-24: qty 8.33

## 2022-02-24 MED ORDER — POLYETHYLENE GLYCOL 3350 17 G PO PACK
17.0000 g | PACK | Freq: Two times a day (BID) | ORAL | Status: DC
  Administered 2022-02-24: 17 g
  Filled 2022-02-24: qty 1

## 2022-02-24 MED ORDER — INSULIN ASPART 100 UNIT/ML IJ SOLN: 0.0000 [IU] | INTRAMUSCULAR | Status: DC

## 2022-02-24 MED ORDER — PIPERACILLIN-TAZOBACTAM 3.375 G IVPB
3.3750 g | Freq: Three times a day (TID) | INTRAVENOUS | Status: DC
  Administered 2022-02-24 – 2022-03-02 (×18): 3.375 g via INTRAVENOUS
  Filled 2022-02-24 (×18): qty 50

## 2022-02-24 MED ORDER — FENTANYL CITRATE (PF) 100 MCG/2ML IJ SOLN
INTRAMUSCULAR | Status: AC
  Filled 2022-02-24: qty 4

## 2022-02-24 MED ORDER — MIDAZOLAM HCL (PF) 5 MG/ML IJ SOLN
INTRAMUSCULAR | Status: AC
  Filled 2022-02-24: qty 2

## 2022-02-24 MED ORDER — DIPHENHYDRAMINE HCL 50 MG/ML IJ SOLN
INTRAMUSCULAR | Status: AC
  Filled 2022-02-24: qty 1

## 2022-02-24 MED ORDER — INSULIN ASPART 100 UNIT/ML IJ SOLN
0.0000 [IU] | Freq: Four times a day (QID) | INTRAMUSCULAR | Status: AC
  Administered 2022-02-24 – 2022-02-25 (×2): 1 [IU] via SUBCUTANEOUS
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
## 2022-02-24 NOTE — Brief Op Note (Signed)
Limited colonoscopy to (probably) transverse colon  Medications: Patient is on fentanyl and Precedex drip on the vent  Indication: Colonic pseudoobstruction (see consult note)  Findings: Limited visualization due to a large amount of thick opaque stool.  Portions of the left colon showed dilatation.  Decompression performed.  Patient's abdominal exam findings of distended and tympanitic abdomen minimally improved at the end of procedure.   Patient's Niece/POA and Dr. Bobbye Morton will be notified by phone.  - Wilfrid Lund, MD

## 2022-02-24 NOTE — Op Note (Signed)
[facility] Patient Name: Antonio Ayers Procedure Date : 02/21/2022 MRN: [id number] Attending MD: Estill Cotta. Loletha Carrow , MD Date of Birth: [birth year] CSN: [id number] Age: 71 Admit Type: Inpatient Procedure:                Colonoscopy Indications:              Decompression of acute non-toxic megacolon Providers:                Mallie Mussel L. Loletha Carrow, MD, Grace Isaac, RN, Benetta Spar, Technician Referring MD:             Dr. Reather Laurence Medicines:                fentanyl and precedex drips on ventilator per icu                            team Complications:            No immediate complications. Estimated Blood Loss:     Estimated blood loss: none. Procedure:                Pre-Anesthesia Assessment:                           - Prior to the procedure, a History and Physical                            was performed, and patient medications and                            allergies were reviewed. The patient's tolerance of                            previous anesthesia was also reviewed. The risks                            and benefits of the procedure and the sedation                            options and risks were discussed with the patient.                            All questions were answered, and informed consent                            was obtained. Prior Anticoagulants: The patient has                            taken no previous anticoagulant or antiplatelet                            agents. ASA Grade Assessment: IV - A patient with  severe systemic disease that is a constant threat                            to life. After reviewing the risks and benefits,                            the patient was deemed in satisfactory condition to                            undergo the procedure.                           After obtaining informed consent, the colonoscope                            was passed under direct  vision. Throughout the                            procedure, the patient's blood pressure, pulse, and                            oxygen saturations were monitored continuously. The                            CF-HQ190L ([id number] Olympus coloscope was                            introduced through the anus and advanced to the                            transverse colon. The colonoscopy was performed                            with difficulty. The patient tolerated the                            procedure well. The quality of the bowel                            preparation was poor. No anatomical landmarks could                            be photographed due to poor prep. Scope In: 2:11:02 PM Scope Out: 2:19:23 PM Total Procedure Duration: 0 hours 8 minutes 20 seconds  Findings:      The perianal and digital rectal examinations were normal.      A large amount of thick stool was found in the entire colon, precluding       visualization. Lavage did not improve visualization.      The lumen of the descending colon and some of the distal transverse was       dilated. Decompressed with suction.      At the conclusion of the exam, the patient's abdominal distension was       minimally improved. Impression:               -  Preparation of the colon was poor.                           - Stool in the entire examined colon.                           - Dilated in the descending colon.                           - No specimens collected. Recommendation:           - Results conveyed to patient's niece and Dr.                            Bobbye Morton of Surgery.                           Surgical decompression warranted. Procedure Code(s):        --- Professional ---                           [phone], 90, Colonoscopy, flexible; diagnostic,                            including collection of specimen(s) by brushing or                            washing, when performed (separate procedure) Diagnosis Code(s):         --- Professional ---                           K59.39, Other megacolon CPT copyright 2019 [employer]. All rights reserved. The codes documented in this report are preliminary and upon coder review may  be revised to meet current compliance requirements. [name] L. Loletha Carrow, MD 02/22/2022 3:47:47 PM This report has been signed electronically. Number of Addenda: 0
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
## 2022-02-24 NOTE — Progress Notes (Signed)
CT A/P with severe colonic distention. Also noted small volume portal venous gas. No evidence of frank perforation at this time, but PV gas is worrisome for impending ischemia. D/w GI and will plan for colonoscopic decompression. Known possibility of perforation and I personally discussed this with the patient's POA who accepts these risks.   Jesusita Oka, MD General and [facility]

## 2022-02-24 NOTE — Progress Notes (Signed)
Patient ID: Antonio Ayers, male   DOB: 1950/11/24, 71 y.o.   MRN: [id number] Mr. Raulerson remains intubated and has had significant abdominal distention.  Events of last night noted.  He is being sent for CT today.  From neurosurgical perspective I am not adding anything to his care at this time but I will remain available should the need arise.  His thoracic fractures should be healing with the amount of bedrest that he has been having.  He can be mobilized when his recovery allows.  Please contact me if further direct consultation is needed.
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
## 2022-02-24 NOTE — Interval H&P Note (Signed)
History and Physical Interval Note:  03/03/2022 2:00 PM  Kathan Kirker  has presented today for surgery, with the diagnosis of decompression.  The various methods of treatment have been discussed with the patient and family. After consideration of risks, benefits and other options for treatment, the patient has consented to  Procedure(s): COLONOSCOPY (N/A) as a surgical intervention.  The patient's history has been reviewed, patient examined, no change in status, stable for surgery.  I have reviewed the patient's chart and labs.  Questions were answered to the patient's satisfaction.     Nelida Meuse III

## 2022-02-24 NOTE — Progress Notes (Signed)
RT assisted with transport of patient from 4N 25 to CT, and back to 4N. Patient transported on full vent support.

## 2022-02-24 NOTE — Progress Notes (Signed)
PT Cancellation Note  Patient Details Name: Antonio Ayers MRN: [id number] DOB: July 14, 1951   Cancelled Treatment:    Reason Eval/Treat Not Completed: Patient not medically ready Pt continues to have medical compliactions that prohibit therapy, PT will sign off at this time. Please re-order when appropriate. Thank you   [name] Rile PT, DPT Acute Rehabilitation Services Office [phone]  [name] 02/09/2022, 8:33 AM
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
## 2022-02-24 NOTE — Progress Notes (Signed)
OT Cancellation Note  Patient Details Name: Antonio Ayers MRN: [id number] DOB: 1951-02-06   Cancelled Treatment:    Reason Eval/Treat Not Completed: Patient's level of consciousness;Patient not medically ready;Medical issues which prohibited therapy (Pt continues to have medical compliactions that prohibit therapy, OT will sign off at this time. Please re-order when appropriate. Thank you.)  [name] A Mirl Hillery 02/20/2022, 8:31 AM
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
## 2022-02-24 NOTE — Progress Notes (Addendum)
Incomplete decompression by GI colonoscopically. Patient's abdomen slightly less tense post-procedure. Patient is extremely high risk for surgery given his baseline comorbid conditions. Will send a LA and consider cecostomy tube placement vs neostigmine.   Jesusita Oka, MD General and [place] [facility]

## 2022-02-25 ENCOUNTER — Other Ambulatory Visit: Payer: Self-pay

## 2022-02-25 LAB — BASIC METABOLIC PANEL
Anion gap: 6 (ref 5–15)
BUN: 50 mg/dL — ABNORMAL HIGH (ref 8–23)
CO2: 21 mmol/L — ABNORMAL LOW (ref 22–32)
Calcium: 7.8 mg/dL — ABNORMAL LOW (ref 8.9–10.3)
Chloride: 118 mmol/L — ABNORMAL HIGH (ref 98–111)
Creatinine, Ser: 0.77 mg/dL (ref 0.61–1.24)
GFR, Estimated: >60 mL/min (ref >60)
Glucose, Bld: 134 mg/dL — ABNORMAL HIGH (ref 70–99)
Potassium: 4.2 mmol/L (ref 3.5–5.1)
Sodium: 145 mmol/L (ref 135–145)

## 2022-02-25 LAB — GLUCOSE, CAPILLARY
Glucose-Capillary: 121 mg/dL — ABNORMAL HIGH (ref 70–99)
Glucose-Capillary: 129 mg/dL — ABNORMAL HIGH (ref 70–99)
Glucose-Capillary: 116 mg/dL — ABNORMAL HIGH (ref 70–99)
Glucose-Capillary: 134 mg/dL — ABNORMAL HIGH (ref 70–99)
Glucose-Capillary: 144 mg/dL — ABNORMAL HIGH (ref 70–99)
Glucose-Capillary: 144 mg/dL — ABNORMAL HIGH (ref 70–99)

## 2022-02-25 LAB — CBC
HCT: 31.8 % — ABNORMAL LOW (ref 39.0–52.0)
Hemoglobin: 10.4 g/dL — ABNORMAL LOW (ref 13.0–17.0)
MCH: 32 pg (ref 26.0–34.0)
MCHC: 32.7 g/dL (ref 30.0–36.0)
MCV: 97.8 fL (ref 80.0–100.0)
Platelets: 122 10*3/uL — ABNORMAL LOW (ref 150–400)
RBC: 3.25 MIL/uL — ABNORMAL LOW (ref 4.22–5.81)
RDW: 14.2 % (ref 11.5–15.5)
WBC: 10.9 10*3/uL — ABNORMAL HIGH (ref 4.0–10.5)
nRBC: 0 % (ref 0.0–0.2)

## 2022-02-25 LAB — URINE CULTURE: Culture: NO GROWTH

## 2022-02-25 LAB — MAGNESIUM: Magnesium: 2.4 mg/dL (ref 1.7–2.4)

## 2022-02-25 LAB — TROPONIN I (HIGH SENSITIVITY): Troponin I (High Sensitivity): 15 ng/L (ref <18)

## 2022-02-25 LAB — PHOSPHORUS: Phosphorus: 3.4 mg/dL (ref 2.5–4.6)

## 2022-02-25 MED ORDER — ATROPINE SULFATE 1 MG/10ML IJ SOSY
PREFILLED_SYRINGE | INTRAMUSCULAR | Status: AC
  Filled 2022-02-25: qty 10

## 2022-02-25 MED ORDER — INSULIN ASPART 100 UNIT/ML IJ SOLN
0.0000 [IU] | INTRAMUSCULAR | Status: DC
  Administered 2022-02-25: 2 [IU] via SUBCUTANEOUS
  Administered 2022-02-25: 1 [IU] via SUBCUTANEOUS
  Administered 2022-02-26 – 2022-02-28 (×12): 2 [IU] via SUBCUTANEOUS
  Administered 2022-02-28: 8 [IU] via SUBCUTANEOUS
  Administered 2022-02-28 – 2022-03-05 (×16): 2 [IU] via SUBCUTANEOUS

## 2022-02-25 MED ORDER — PANTOPRAZOLE SODIUM 40 MG IV SOLR
40.0000 mg | Freq: Every day | INTRAVENOUS | Status: DC
Start: 2022-02-25 — End: 2022-03-05
  Administered 2022-02-25 – 2022-03-04 (×8): 40 mg via INTRAVENOUS
  Filled 2022-02-25 (×8): qty 10

## 2022-02-25 MED ORDER — NEOSTIGMINE METHYLSULFATE 10 MG/10ML IV SOLN
1.0000 mg | Freq: Once | INTRAVENOUS | Status: AC
  Administered 2022-02-25: 1 mg via INTRAVENOUS
  Filled 2022-02-25: qty 1

## 2022-02-25 MED ORDER — TRAVASOL 10 % IV SOLN
INTRAVENOUS | Status: AC
  Filled 2022-02-25: qty 714

## 2022-02-25 NOTE — Progress Notes (Signed)
Patient had asystole episode at 2121. Patient spontaneously converted back to normal sinus rhythm. Patient is responsive, pulses present. Precedex stopped. Dr. Grandville Silos notified. Verbal orders for BMP, troponin, and a 12 lead ECG. Labs have been sent; 12 lead ECG complete. Dr. Grandville Silos notified.

## 2022-02-25 NOTE — Progress Notes (Signed)
This RN was notified of a critical glucose on BMP. After notification, a CBG was done and it was 144. Dr. Grandville Silos notified. Order for another BMP.
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
## 2022-02-26 DIAGNOSIS — I469 Cardiac arrest, cause unspecified: Secondary | ICD-10-CM

## 2022-02-26 LAB — BASIC METABOLIC PANEL
Anion gap: 3 — ABNORMAL LOW (ref 5–15)
BUN: 36 mg/dL — ABNORMAL HIGH (ref 8–23)
CO2: 22 mmol/L (ref 22–32)
Calcium: 7.6 mg/dL — ABNORMAL LOW (ref 8.9–10.3)
Chloride: 121 mmol/L — ABNORMAL HIGH (ref 98–111)
Creatinine, Ser: 0.58 mg/dL — ABNORMAL LOW (ref 0.61–1.24)
GFR, Estimated: >60 mL/min (ref >60)
Glucose, Bld: 132 mg/dL — ABNORMAL HIGH (ref 70–99)
Potassium: 3.4 mmol/L — ABNORMAL LOW (ref 3.5–5.1)
Sodium: 146 mmol/L — ABNORMAL HIGH (ref 135–145)

## 2022-02-26 LAB — GLUCOSE, CAPILLARY
Glucose-Capillary: 124 mg/dL — ABNORMAL HIGH (ref 70–99)
Glucose-Capillary: 124 mg/dL — ABNORMAL HIGH (ref 70–99)
Glucose-Capillary: 127 mg/dL — ABNORMAL HIGH (ref 70–99)
Glucose-Capillary: 131 mg/dL — ABNORMAL HIGH (ref 70–99)
Glucose-Capillary: 134 mg/dL — ABNORMAL HIGH (ref 70–99)
Glucose-Capillary: 140 mg/dL — ABNORMAL HIGH (ref 70–99)

## 2022-02-26 LAB — COMPREHENSIVE METABOLIC PANEL
ALT: 33 U/L (ref 0–44)
AST: 38 U/L (ref 15–41)
Albumin: 2.1 g/dL — ABNORMAL LOW (ref 3.5–5.0)
Alkaline Phosphatase: 62 U/L (ref 38–126)
Anion gap: 7 (ref 5–15)
BUN: 33 mg/dL — ABNORMAL HIGH (ref 8–23)
CO2: 22 mmol/L (ref 22–32)
Calcium: 8 mg/dL — ABNORMAL LOW (ref 8.9–10.3)
Chloride: 118 mmol/L — ABNORMAL HIGH (ref 98–111)
Creatinine, Ser: 0.66 mg/dL (ref 0.61–1.24)
GFR, Estimated: >60 mL/min (ref >60)
Glucose, Bld: 132 mg/dL — ABNORMAL HIGH (ref 70–99)
Potassium: 3.2 mmol/L — ABNORMAL LOW (ref 3.5–5.1)
Sodium: 147 mmol/L — ABNORMAL HIGH (ref 135–145)
Total Bilirubin: 1.1 mg/dL (ref 0.3–1.2)
Total Protein: 5.5 g/dL — ABNORMAL LOW (ref 6.5–8.1)

## 2022-02-26 LAB — TRIGLYCERIDES: Triglycerides: 126 mg/dL (ref <150)

## 2022-02-26 LAB — MAGNESIUM: Magnesium: 2.2 mg/dL (ref 1.7–2.4)

## 2022-02-26 LAB — PHOSPHORUS: Phosphorus: 3.3 mg/dL (ref 2.5–4.6)

## 2022-02-26 LAB — TROPONIN I (HIGH SENSITIVITY): Troponin I (High Sensitivity): 14 ng/L (ref <18)

## 2022-02-26 MED ORDER — TRAVASOL 10 % IV SOLN
INTRAVENOUS | Status: AC
  Filled 2022-02-26: qty 1499.4

## 2022-02-26 MED ORDER — NEOSTIGMINE METHYLSULFATE 10 MG/10ML IV SOLN
1.0000 mg | Freq: Once | INTRAVENOUS | Status: DC
  Filled 2022-02-26: qty 1

## 2022-02-26 MED ORDER — POTASSIUM CHLORIDE 10 MEQ/50ML IV SOLN
10.0000 meq | INTRAVENOUS | Status: AC
  Administered 2022-02-26 (×6): 10 meq via INTRAVENOUS
  Filled 2022-02-26: qty 50

## 2022-02-26 MED ORDER — ATROPINE SULFATE 1 MG/10ML IJ SOSY
PREFILLED_SYRINGE | INTRAMUSCULAR | Status: AC
  Filled 2022-02-26: qty 10

## 2022-02-26 MED ORDER — LORAZEPAM 2 MG/ML IJ SOLN
2.0000 mg | INTRAMUSCULAR | Status: DC | PRN
  Administered 2022-02-26 (×3): 2 mg via INTRAVENOUS
  Filled 2022-02-26 (×3): qty 1

## 2022-02-26 MED ORDER — POTASSIUM CHLORIDE 10 MEQ/100ML IV SOLN: 10.0000 meq | INTRAVENOUS | Status: DC

## 2022-02-26 MED ORDER — PROPOFOL 1000 MG/100ML IV EMUL
5.0000 ug/kg/min | INTRAVENOUS | Status: DC
  Administered 2022-02-26 – 2022-02-27 (×3): 20 ug/kg/min via INTRAVENOUS
  Filled 2022-02-26 (×3): qty 100

## 2022-02-26 MED ORDER — PROPOFOL 1000 MG/100ML IV EMUL
INTRAVENOUS | Status: AC
  Administered 2022-02-26: 20 ug/kg/min via INTRAVENOUS
  Filled 2022-02-26: qty 100
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
## 2022-02-26 NOTE — Progress Notes (Signed)
Trauma Event Note    TRN at bedside to round. Remains intubated/sedated. Asystolic event earlier this shift, Dr. Grandville Silos made aware by primary RN. ST with PVCs at the time of my rounds.   Last imported Vital Signs BP 113/77   Pulse 100   Temp 98.1 F (36.7 C)   Resp (!) 22   Ht '6\' 1"'$  (1.854 m)   Wt 220 lb 7.4 oz (100 kg)   SpO2 95%   BMI 29.09 kg/m    Antonio Ayers Antonio Ayers  Trauma Response RN  Please call TRN at [phone] for further assistance.

## 2022-02-26 NOTE — Progress Notes (Signed)
Per Dr Bobbye Morton, holding off on giving neostigmine until speaking with cardiology about last PM's asystole episode.  [name] RN
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
## 2022-02-26 NOTE — Progress Notes (Signed)
Dr. Grandville Silos updated about patient's lab results. Dr. Grandville Silos updated about patient's frequent PVCs. No new orders. Dr. Grandville Silos also updated about patient's agitation with the precedex being off. MD did not want to restart precedex at this time. Verbal orders for PRN IV ativan 2 mg every 4 hours. Will continue to monitor.

## 2022-02-26 NOTE — TOC Progression Note (Signed)
Transition of Care [facility]) - Progression Note    Patient Details  Name: Antonio Ayers MRN: [id number] Date of Birth: 06-04-1951  Transition of Care [facility]) CM/SW Contact  Ella Bodo, RN Phone Number: 02/26/2022, 4:23 PM  Clinical Narrative:    Patient's hospital stay complicated by colonic ileus; he remains sedated and on ventilator. TOC will continue to follow for discharge planning needs as patient progresses.     Expected Discharge Plan: Skilled Nursing Facility Barriers to Discharge: Continued Medical Work up  Expected Discharge Plan and Services Expected Discharge Plan: [place]   Discharge Planning Services: CM Consult   Living arrangements for the past 2 months: Apartment                                       Social Determinants of Health (SDOH) Interventions    Readmission Risk Interventions     No data to display         Reinaldo Raddle, RN, BSN  Trauma/Neuro ICU Case Manager [phone]

## 2022-02-27 ENCOUNTER — Inpatient Hospital Stay (HOSPITAL_COMMUNITY): Payer: Medicare Other

## 2022-02-27 LAB — BASIC METABOLIC PANEL
Anion gap: 4 — ABNORMAL LOW (ref 5–15)
BUN: 28 mg/dL — ABNORMAL HIGH (ref 8–23)
CO2: 23 mmol/L (ref 22–32)
Calcium: 7.7 mg/dL — ABNORMAL LOW (ref 8.9–10.3)
Chloride: 116 mmol/L — ABNORMAL HIGH (ref 98–111)
Creatinine, Ser: 0.63 mg/dL (ref 0.61–1.24)
GFR, Estimated: >60 mL/min (ref >60)
Glucose, Bld: 160 mg/dL — ABNORMAL HIGH (ref 70–99)
Potassium: 3.9 mmol/L (ref 3.5–5.1)
Sodium: 143 mmol/L (ref 135–145)

## 2022-02-27 LAB — GLUCOSE, CAPILLARY
Glucose-Capillary: 131 mg/dL — ABNORMAL HIGH (ref 70–99)
Glucose-Capillary: 126 mg/dL — ABNORMAL HIGH (ref 70–99)
Glucose-Capillary: 136 mg/dL — ABNORMAL HIGH (ref 70–99)
Glucose-Capillary: 129 mg/dL — ABNORMAL HIGH (ref 70–99)
Glucose-Capillary: 116 mg/dL — ABNORMAL HIGH (ref 70–99)

## 2022-02-27 LAB — PHOSPHORUS: Phosphorus: 3.3 mg/dL (ref 2.5–4.6)

## 2022-02-27 LAB — TRIGLYCERIDES
Triglycerides: 590 mg/dL — ABNORMAL HIGH (ref <150)
Triglycerides: 88 mg/dL (ref <150)

## 2022-02-27 LAB — MAGNESIUM: Magnesium: 2.2 mg/dL (ref 1.7–2.4)

## 2022-02-27 MED ORDER — MIDAZOLAM BOLUS VIA INFUSION: 0.0000 mg | INTRAVENOUS | Status: DC | PRN

## 2022-02-27 MED ORDER — MIDAZOLAM-SODIUM CHLORIDE 100-0.9 MG/100ML-% IV SOLN
0.0000 mg/h | INTRAVENOUS | Status: DC
  Administered 2022-02-27: 2 mg/h via INTRAVENOUS
  Filled 2022-02-27: qty 100

## 2022-02-27 MED ORDER — TRAVASOL 10 % IV SOLN
INTRAVENOUS | Status: AC
  Filled 2022-02-27: qty 1499.4

## 2022-02-27 MED ORDER — TRACE MINERALS CU-MN-SE-ZN 300-55-60-3000 MCG/ML IV SOLN: INTRAVENOUS | Status: DC
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
## 2022-02-27 NOTE — Progress Notes (Signed)
Pt placed on PS/CPAP 5/5 on 40% and is tolerating well. RT will monitor. 

## 2022-02-28 ENCOUNTER — Other Ambulatory Visit: Payer: Self-pay

## 2022-02-28 LAB — BASIC METABOLIC PANEL
Anion gap: 4 — ABNORMAL LOW (ref 5–15)
Anion gap: 4 — ABNORMAL LOW (ref 5–15)
BUN: 26 mg/dL — ABNORMAL HIGH (ref 8–23)
BUN: 23 mg/dL (ref 8–23)
CO2: 23 mmol/L (ref 22–32)
CO2: 26 mmol/L (ref 22–32)
Calcium: 8 mg/dL — ABNORMAL LOW (ref 8.9–10.3)
Calcium: 8 mg/dL — ABNORMAL LOW (ref 8.9–10.3)
Chloride: 117 mmol/L — ABNORMAL HIGH (ref 98–111)
Chloride: 116 mmol/L — ABNORMAL HIGH (ref 98–111)
Creatinine, Ser: 0.6 mg/dL — ABNORMAL LOW (ref 0.61–1.24)
Creatinine, Ser: 0.51 mg/dL — ABNORMAL LOW (ref 0.61–1.24)
GFR, Estimated: >60 mL/min (ref >60)
GFR, Estimated: >60 mL/min (ref >60)
Glucose, Bld: 131 mg/dL — ABNORMAL HIGH (ref 70–99)
Glucose, Bld: 108 mg/dL — ABNORMAL HIGH (ref 70–99)
Potassium: 5.9 mmol/L — ABNORMAL HIGH (ref 3.5–5.1)
Potassium: 4 mmol/L (ref 3.5–5.1)
Sodium: 144 mmol/L (ref 135–145)
Sodium: 146 mmol/L — ABNORMAL HIGH (ref 135–145)

## 2022-02-28 LAB — GLUCOSE, CAPILLARY
Glucose-Capillary: 109 mg/dL — ABNORMAL HIGH (ref 70–99)
Glucose-Capillary: 130 mg/dL — ABNORMAL HIGH (ref 70–99)
Glucose-Capillary: 133 mg/dL — ABNORMAL HIGH (ref 70–99)
Glucose-Capillary: 136 mg/dL — ABNORMAL HIGH (ref 70–99)
Glucose-Capillary: 147 mg/dL — ABNORMAL HIGH (ref 70–99)
Glucose-Capillary: 150 mg/dL — ABNORMAL HIGH (ref 70–99)
Glucose-Capillary: 295 mg/dL — ABNORMAL HIGH (ref 70–99)

## 2022-02-28 LAB — PHOSPHORUS: Phosphorus: 3.1 mg/dL (ref 2.5–4.6)

## 2022-02-28 LAB — MAGNESIUM: Magnesium: 2 mg/dL (ref 1.7–2.4)

## 2022-02-28 MED ORDER — SODIUM CHLORIDE 0.9 % IV SOLN: INTRAVENOUS | Status: DC | PRN

## 2022-02-28 MED ORDER — TRAVASOL 10 % IV SOLN
INTRAVENOUS | Status: AC
  Filled 2022-02-28: qty 1499.4

## 2022-02-28 MED ORDER — CALCIUM GLUCONATE-NACL 1-0.675 GM/50ML-% IV SOLN: 1.0000 g | Freq: Once | INTRAVENOUS | Status: DC

## 2022-02-28 MED ORDER — FUROSEMIDE 10 MG/ML IJ SOLN
40.0000 mg | Freq: Once | INTRAMUSCULAR | Status: AC
  Administered 2022-02-28: 40 mg via INTRAVENOUS
  Filled 2022-02-28: qty 4

## 2022-02-28 MED ORDER — METHOCARBAMOL 500 MG PO TABS: 1000.0000 mg | ORAL_TABLET | Freq: Three times a day (TID) | ORAL | Status: DC

## 2022-02-28 MED ORDER — METHOCARBAMOL 1000 MG/10ML IJ SOLN
1000.0000 mg | Freq: Three times a day (TID) | INTRAVENOUS | Status: DC
  Administered 2022-02-28 – 2022-03-05 (×17): 1000 mg via INTRAVENOUS
  Filled 2022-02-28 (×5): qty 10
  Filled 2022-02-28 (×2): qty 1000
  Filled 2022-02-28 (×11): qty 10
  Filled 2022-02-28: qty 1000
  Filled 2022-02-28 (×2): qty 10

## 2022-02-28 MED ORDER — DEXTROSE 50 % IV SOLN
1.0000 | Freq: Once | INTRAVENOUS | Status: DC
Start: 2022-02-28 — End: 2022-02-28

## 2022-02-28 MED ORDER — INSULIN ASPART 100 UNIT/ML IV SOLN: 10.0000 [IU] | Freq: Once | INTRAVENOUS | Status: DC

## 2022-02-28 MED ORDER — METOPROLOL TARTRATE 5 MG/5ML IV SOLN
2.5000 mg | Freq: Once | INTRAVENOUS | Status: AC
  Administered 2022-02-28: 2.5 mg via INTRAVENOUS
  Filled 2022-02-28: qty 5

## 2022-02-28 MED ORDER — FUROSEMIDE 10 MG/ML IJ SOLN: 40.0000 mg | Freq: Once | INTRAMUSCULAR | Status: DC

## 2022-02-28 MED ORDER — SODIUM ZIRCONIUM CYCLOSILICATE 10 G PO PACK: 10.0000 g | PACK | Freq: Once | ORAL | Status: DC

## 2022-02-28 MED ORDER — DEXTROSE 10 % IV SOLN: INTRAVENOUS | Status: DC

## 2022-02-28 NOTE — Procedures (Signed)
Extubation Procedure Note  Patient Details:   Name: Antonio Ayers DOB: July 24, 1951 MRN: [id number]   Airway Documentation:    Vent end date: 02/28/22 Vent end time: 0909   Evaluation  O2 sats: stable throughout Complications: No apparent complications Patient did tolerate procedure well. Bilateral Breath Sounds: Diminished, Rhonchi   Yes  Patient extubated per order to 4L [place] with RN and MD at bedside. Positive cuff leak noted prior to extubation. Patient has inadequate cough and is not able to speak at this time. MD has reached out to pt's niece and is awaiting call back. Vitals are stable. RT will continue to monitor.   Shreyas Piatkowski Clyda Greener 02/28/2022, 9:29 AM
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
## 2022-02-28 NOTE — Evaluation (Signed)
SLP Cancellation Note  Patient Details Name: Antonio Ayers MRN: [id number] DOB: May 13, 1951   Cancelled treatment:        Pt not medically ready  Orders received and chart reviewed. RN in room upon SLP arrival--requested hold for swallow eval until tomorrow d/t ongoing confusion and lethargy.  Joby Richart P. Charmon Thorson, M.S., CCC-SLP Speech-Language Pathologist Acute Rehabilitation Services Pager: [place] 02/28/2022, 2:31 PM

## 2022-02-28 NOTE — Progress Notes (Signed)
Wasted 140 cc Fentanyl and 60 cc Versed with Melody Joya Gaskins RN.
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
## 2022-03-01 LAB — CBC
HCT: 28.1 % — ABNORMAL LOW (ref 39.0–52.0)
Hemoglobin: 9.6 g/dL — ABNORMAL LOW (ref 13.0–17.0)
MCH: 34 pg (ref 26.0–34.0)
MCHC: 34.2 g/dL (ref 30.0–36.0)
MCV: 99.6 fL (ref 80.0–100.0)
Platelets: 238 10*3/uL (ref 150–400)
RBC: 2.82 MIL/uL — ABNORMAL LOW (ref 4.22–5.81)
RDW: 13.9 % (ref 11.5–15.5)
WBC: 9.1 10*3/uL (ref 4.0–10.5)
nRBC: 0 % (ref 0.0–0.2)

## 2022-03-01 LAB — COMPREHENSIVE METABOLIC PANEL
ALT: 41 U/L (ref 0–44)
AST: 46 U/L — ABNORMAL HIGH (ref 15–41)
Albumin: 2.1 g/dL — ABNORMAL LOW (ref 3.5–5.0)
Alkaline Phosphatase: 75 U/L (ref 38–126)
Anion gap: 7 (ref 5–15)
BUN: 20 mg/dL (ref 8–23)
CO2: 26 mmol/L (ref 22–32)
Calcium: 8.1 mg/dL — ABNORMAL LOW (ref 8.9–10.3)
Chloride: 112 mmol/L — ABNORMAL HIGH (ref 98–111)
Creatinine, Ser: 0.53 mg/dL — ABNORMAL LOW (ref 0.61–1.24)
GFR, Estimated: >60 mL/min (ref >60)
Glucose, Bld: 131 mg/dL — ABNORMAL HIGH (ref 70–99)
Potassium: 3 mmol/L — ABNORMAL LOW (ref 3.5–5.1)
Sodium: 145 mmol/L (ref 135–145)
Total Bilirubin: 1.1 mg/dL (ref 0.3–1.2)
Total Protein: 5.6 g/dL — ABNORMAL LOW (ref 6.5–8.1)

## 2022-03-01 LAB — GLUCOSE, CAPILLARY
Glucose-Capillary: 113 mg/dL — ABNORMAL HIGH (ref 70–99)
Glucose-Capillary: 127 mg/dL — ABNORMAL HIGH (ref 70–99)
Glucose-Capillary: 130 mg/dL — ABNORMAL HIGH (ref 70–99)
Glucose-Capillary: 133 mg/dL — ABNORMAL HIGH (ref 70–99)
Glucose-Capillary: 136 mg/dL — ABNORMAL HIGH (ref 70–99)
Glucose-Capillary: 139 mg/dL — ABNORMAL HIGH (ref 70–99)
Glucose-Capillary: >600 mg/dL (ref 70–99)

## 2022-03-01 LAB — PHOSPHORUS: Phosphorus: 2.3 mg/dL — ABNORMAL LOW (ref 2.5–4.6)

## 2022-03-01 LAB — MAGNESIUM: Magnesium: 1.8 mg/dL (ref 1.7–2.4)

## 2022-03-01 MED ORDER — TRAVASOL 10 % IV SOLN
INTRAVENOUS | Status: DC
  Filled 2022-03-01: qty 1499.4

## 2022-03-01 MED ORDER — POTASSIUM CHLORIDE 10 MEQ/50ML IV SOLN
10.0000 meq | INTRAVENOUS | Status: AC
  Administered 2022-03-01 (×6): 10 meq via INTRAVENOUS
  Filled 2022-03-01 (×6): qty 50
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
## 2022-03-01 NOTE — TOC Progression Note (Signed)
Transition of Care [facility] - Progression Note    Patient Details  Name: Antonio Ayers MRN: [id number] Date of Birth: [birth year]  Transition of [facility]) CM/SW Contact  Ella Bodo, RN Phone Number: 03/01/2022, 11:45am  Clinical Narrative:    Pt extubated yesterday; still on TPN with [facility].  Met with patient and niece, Antonio Ayers, at bedside.  PT/OT recommending SNF at discharge; family unable to provide needed support at dc.  Patient confused; discussed SNF process with niece; she understands that patient will need to be off TPN and have a permanent means of feeding prior to dc to a facility. Will continue to follow patient's progress, and fax out for SNF once able to swallow or permanent feeding route established.    Expected Discharge Plan: Skilled Nursing Facility Barriers to Discharge: Continued Medical Work up  Expected Discharge Plan and Services Expected Discharge Plan: [place]   Discharge Planning Services: CM Consult   Living arrangements for the past 2 months: Apartment                                       Social Determinants of Health (SDOH) Interventions    Readmission Risk Interventions     No data to display         Reinaldo Raddle, RN, BSN  Trauma/Neuro ICU Case Manager [phone]
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
## 2022-03-02 LAB — GLUCOSE, CAPILLARY
Glucose-Capillary: 120 mg/dL — ABNORMAL HIGH (ref 70–99)
Glucose-Capillary: 128 mg/dL — ABNORMAL HIGH (ref 70–99)
Glucose-Capillary: 127 mg/dL — ABNORMAL HIGH (ref 70–99)
Glucose-Capillary: 116 mg/dL — ABNORMAL HIGH (ref 70–99)
Glucose-Capillary: 122 mg/dL — ABNORMAL HIGH (ref 70–99)
Glucose-Capillary: 115 mg/dL — ABNORMAL HIGH (ref 70–99)

## 2022-03-02 LAB — BASIC METABOLIC PANEL
Anion gap: 6 (ref 5–15)
BUN: 18 mg/dL (ref 8–23)
CO2: 23 mmol/L (ref 22–32)
Calcium: 8.2 mg/dL — ABNORMAL LOW (ref 8.9–10.3)
Chloride: 111 mmol/L (ref 98–111)
Creatinine, Ser: 0.49 mg/dL — ABNORMAL LOW (ref 0.61–1.24)
GFR, Estimated: >60 mL/min (ref >60)
Glucose, Bld: 129 mg/dL — ABNORMAL HIGH (ref 70–99)
Potassium: 3.5 mmol/L (ref 3.5–5.1)
Sodium: 140 mmol/L (ref 135–145)

## 2022-03-02 LAB — MAGNESIUM: Magnesium: 1.7 mg/dL (ref 1.7–2.4)

## 2022-03-02 LAB — CBC
HCT: 30.7 % — ABNORMAL LOW (ref 39.0–52.0)
Hemoglobin: 10.3 g/dL — ABNORMAL LOW (ref 13.0–17.0)
MCH: 32.3 pg (ref 26.0–34.0)
MCHC: 33.6 g/dL (ref 30.0–36.0)
MCV: 96.2 fL (ref 80.0–100.0)
Platelets: 278 10*3/uL (ref 150–400)
RBC: 3.19 MIL/uL — ABNORMAL LOW (ref 4.22–5.81)
RDW: 13.6 % (ref 11.5–15.5)
WBC: 8 10*3/uL (ref 4.0–10.5)
nRBC: 0 % (ref 0.0–0.2)

## 2022-03-02 LAB — PHOSPHORUS: Phosphorus: 3.1 mg/dL (ref 2.5–4.6)

## 2022-03-02 MED ORDER — FUROSEMIDE 10 MG/ML IJ SOLN
40.0000 mg | Freq: Once | INTRAMUSCULAR | Status: AC
  Administered 2022-03-02: 40 mg via INTRAVENOUS
  Filled 2022-03-02: qty 4

## 2022-03-02 MED ORDER — TRAVASOL 10 % IV SOLN
INTRAVENOUS | Status: AC
  Filled 2022-03-02: qty 1499.4

## 2022-03-02 MED ORDER — POTASSIUM CHLORIDE 10 MEQ/50ML IV SOLN
10.0000 meq | INTRAVENOUS | Status: AC
  Administered 2022-03-02 (×6): 10 meq via INTRAVENOUS
  Filled 2022-03-02 (×6): qty 50

## 2022-03-02 MED ORDER — POTASSIUM CHLORIDE 20 MEQ PO PACK
40.0000 meq | PACK | Freq: Once | ORAL | Status: AC
Start: 2022-03-02 — End: 2022-03-02
  Administered 2022-03-02: 40 meq
  Filled 2022-03-02: qty 2

## 2022-03-02 MED ORDER — PIVOT 1.5 CAL PO LIQD
1000.0000 mL | ORAL | Status: DC
Start: 2022-03-02 — End: 2022-03-03
  Administered 2022-03-02 – 2022-03-03 (×2): 1000 mL
  Filled 2022-03-02: qty 1000

## 2022-03-02 MED ORDER — TRAVASOL 10 % IV SOLN
INTRAVENOUS | Status: DC
  Filled 2022-03-02: qty 1499.4

## 2022-03-02 MED ORDER — TRAVASOL 10 % IV SOLN
INTRAVENOUS | Status: AC
  Filled 2022-03-02: qty 785.4

## 2022-03-02 MED ORDER — TRAVASOL 10 % IV SOLN: INTRAVENOUS | Status: DC
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
## 2022-03-03 LAB — CBC
HCT: 32.1 % — ABNORMAL LOW (ref 39.0–52.0)
Hemoglobin: 10.6 g/dL — ABNORMAL LOW (ref 13.0–17.0)
MCH: 31.8 pg (ref 26.0–34.0)
MCHC: 33 g/dL (ref 30.0–36.0)
MCV: 96.4 fL (ref 80.0–100.0)
Platelets: 341 10*3/uL (ref 150–400)
RBC: 3.33 MIL/uL — ABNORMAL LOW (ref 4.22–5.81)
RDW: 14 % (ref 11.5–15.5)
WBC: 8.4 10*3/uL (ref 4.0–10.5)
nRBC: 0 % (ref 0.0–0.2)

## 2022-03-03 LAB — GLUCOSE, CAPILLARY
Glucose-Capillary: 122 mg/dL — ABNORMAL HIGH (ref 70–99)
Glucose-Capillary: 108 mg/dL — ABNORMAL HIGH (ref 70–99)
Glucose-Capillary: 123 mg/dL — ABNORMAL HIGH (ref 70–99)
Glucose-Capillary: 93 mg/dL (ref 70–99)
Glucose-Capillary: 100 mg/dL — ABNORMAL HIGH (ref 70–99)

## 2022-03-03 MED ORDER — PIVOT 1.5 CAL PO LIQD
1000.0000 mL | ORAL | Status: DC
Start: 2022-03-04 — End: 2022-03-03

## 2022-03-03 MED ORDER — PIVOT 1.5 CAL PO LIQD
1000.0000 mL | ORAL | Status: DC
  Administered 2022-03-03: 1000 mL

## 2022-03-03 NOTE — Care Management Important Message (Signed)
Important Message  Patient Details  Name: Antonio Ayers MRN: [id number] Date of Birth: 05/25/1951   Medicare Important Message Given:  Yes     Kerrilynn Derenzo 03/03/2022, 3:09 PM
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
## 2022-03-04 ENCOUNTER — Encounter (HOSPITAL_COMMUNITY): Admission: RE | Payer: Self-pay | Source: Ambulatory Visit

## 2022-03-04 ENCOUNTER — Ambulatory Visit (HOSPITAL_COMMUNITY): Admission: RE | Admit: 2022-03-04 | Payer: Medicare Other | Source: Ambulatory Visit | Admitting: Orthopedic Surgery

## 2022-03-04 LAB — COMPREHENSIVE METABOLIC PANEL
ALT: 48 U/L — ABNORMAL HIGH (ref 0–44)
ALT: 45 U/L — ABNORMAL HIGH (ref 0–44)
AST: 40 U/L (ref 15–41)
AST: 35 U/L (ref 15–41)
Albumin: 2.3 g/dL — ABNORMAL LOW (ref 3.5–5.0)
Albumin: 2.6 g/dL — ABNORMAL LOW (ref 3.5–5.0)
Alkaline Phosphatase: 107 U/L (ref 38–126)
Alkaline Phosphatase: 120 U/L (ref 38–126)
Anion gap: 8 (ref 5–15)
Anion gap: 7 (ref 5–15)
BUN: 21 mg/dL (ref 8–23)
BUN: 27 mg/dL — ABNORMAL HIGH (ref 8–23)
CO2: 23 mmol/L (ref 22–32)
CO2: 22 mmol/L (ref 22–32)
Calcium: 8.4 mg/dL — ABNORMAL LOW (ref 8.9–10.3)
Calcium: 8.2 mg/dL — ABNORMAL LOW (ref 8.9–10.3)
Chloride: 110 mmol/L (ref 98–111)
Chloride: 112 mmol/L — ABNORMAL HIGH (ref 98–111)
Creatinine, Ser: 0.49 mg/dL — ABNORMAL LOW (ref 0.61–1.24)
Creatinine, Ser: 0.67 mg/dL (ref 0.61–1.24)
GFR, Estimated: >60 mL/min (ref >60)
GFR, Estimated: >60 mL/min (ref >60)
Glucose, Bld: 115 mg/dL — ABNORMAL HIGH (ref 70–99)
Glucose, Bld: 146 mg/dL — ABNORMAL HIGH (ref 70–99)
Potassium: 3.6 mmol/L (ref 3.5–5.1)
Potassium: 3.7 mmol/L (ref 3.5–5.1)
Sodium: 141 mmol/L (ref 135–145)
Sodium: 141 mmol/L (ref 135–145)
Total Bilirubin: 1 mg/dL (ref 0.3–1.2)
Total Bilirubin: 1 mg/dL (ref 0.3–1.2)
Total Protein: 5.7 g/dL — ABNORMAL LOW (ref 6.5–8.1)
Total Protein: 6.2 g/dL — ABNORMAL LOW (ref 6.5–8.1)

## 2022-03-04 LAB — CBC
HCT: 31.5 % — ABNORMAL LOW (ref 39.0–52.0)
Hemoglobin: 10.3 g/dL — ABNORMAL LOW (ref 13.0–17.0)
MCH: 32.1 pg (ref 26.0–34.0)
MCHC: 32.7 g/dL (ref 30.0–36.0)
MCV: 98.1 fL (ref 80.0–100.0)
Platelets: 297 10*3/uL (ref 150–400)
RBC: 3.21 MIL/uL — ABNORMAL LOW (ref 4.22–5.81)
RDW: 14.4 % (ref 11.5–15.5)
WBC: 10.3 10*3/uL (ref 4.0–10.5)
nRBC: 0 % (ref 0.0–0.2)

## 2022-03-04 LAB — GLUCOSE, CAPILLARY
Glucose-Capillary: 116 mg/dL — ABNORMAL HIGH (ref 70–99)
Glucose-Capillary: 103 mg/dL — ABNORMAL HIGH (ref 70–99)
Glucose-Capillary: 101 mg/dL — ABNORMAL HIGH (ref 70–99)
Glucose-Capillary: 118 mg/dL — ABNORMAL HIGH (ref 70–99)
Glucose-Capillary: 115 mg/dL — ABNORMAL HIGH (ref 70–99)

## 2022-03-04 LAB — MAGNESIUM: Magnesium: 2.2 mg/dL (ref 1.7–2.4)

## 2022-03-04 LAB — PHOSPHORUS: Phosphorus: 3.2 mg/dL (ref 2.5–4.6)

## 2022-03-04 SURGERY — ARTHROPLASTY, HIP, TOTAL, ANTERIOR APPROACH
Anesthesia: Spinal | Site: Hip | Laterality: Left

## 2022-03-04 MED ORDER — PIVOT 1.5 CAL PO LIQD: 1000.0000 mL | ORAL | Status: DC

## 2022-03-04 MED ORDER — POTASSIUM CHLORIDE 20 MEQ PO PACK
40.0000 meq | PACK | Freq: Once | ORAL | Status: AC
  Administered 2022-03-04: 40 meq
  Filled 2022-03-04: qty 2

## 2022-03-04 MED ORDER — BETHANECHOL CHLORIDE 25 MG PO TABS
25.0000 mg | ORAL_TABLET | Freq: Three times a day (TID) | ORAL | Status: DC
  Administered 2022-03-04 – 2022-03-05 (×4): 25 mg
  Filled 2022-03-04 (×4): qty 1

## 2022-03-04 MED ORDER — ORAL CARE MOUTH RINSE
15.0000 mL | OROMUCOSAL | Status: DC
  Administered 2022-03-04 – 2022-03-05 (×6): 15 mL via OROMUCOSAL

## 2022-03-04 MED ORDER — PIVOT 1.5 CAL PO LIQD
1000.0000 mL | ORAL | Status: DC
  Administered 2022-03-04 – 2022-03-05 (×2): 1000 mL
  Filled 2022-03-04 (×2): qty 1000

## 2022-03-04 MED ORDER — ORAL CARE MOUTH RINSE
15.0000 mL | OROMUCOSAL | Status: DC | PRN
  Administered 2022-03-04 (×2): 15 mL via OROMUCOSAL

## 2022-03-04 MED ORDER — FUROSEMIDE 10 MG/ML IJ SOLN
40.0000 mg | Freq: Once | INTRAMUSCULAR | Status: AC
  Administered 2022-03-04: 40 mg via INTRAVENOUS
  Filled 2022-03-04: qty 4

## 2022-03-04 MED ORDER — LORAZEPAM 2 MG/ML IJ SOLN
2.0000 mg | INTRAMUSCULAR | Status: DC | PRN
  Administered 2022-03-04 – 2022-03-05 (×5): 2 mg via INTRAVENOUS
  Filled 2022-03-04 (×5): qty 1
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
## 2022-03-05 ENCOUNTER — Inpatient Hospital Stay (HOSPITAL_COMMUNITY): Payer: Medicare Other

## 2022-03-05 LAB — CBC
HCT: 32.4 % — ABNORMAL LOW (ref 39.0–52.0)
Hemoglobin: 10.5 g/dL — ABNORMAL LOW (ref 13.0–17.0)
MCH: 31.5 pg (ref 26.0–34.0)
MCHC: 32.4 g/dL (ref 30.0–36.0)
MCV: 97.3 fL (ref 80.0–100.0)
Platelets: 357 10*3/uL (ref 150–400)
RBC: 3.33 MIL/uL — ABNORMAL LOW (ref 4.22–5.81)
RDW: 14.6 % (ref 11.5–15.5)
WBC: 9.8 10*3/uL (ref 4.0–10.5)
nRBC: 0 % (ref 0.0–0.2)

## 2022-03-05 LAB — POCT I-STAT 7, (LYTES, BLD GAS, ICA,H+H)
Acid-base deficit: 2 mmol/L (ref 0.0–2.0)
Bicarbonate: 21.9 mmol/L (ref 20.0–28.0)
Calcium, Ion: 1.2 mmol/L (ref 1.15–1.40)
HCT: 29 % — ABNORMAL LOW (ref 39.0–52.0)
Hemoglobin: 9.9 g/dL — ABNORMAL LOW (ref 13.0–17.0)
O2 Saturation: 90 %
Patient temperature: 37.8
Potassium: 4 mmol/L (ref 3.5–5.1)
Sodium: 143 mmol/L (ref 135–145)
TCO2: 23 mmol/L (ref 22–32)
pCO2 arterial: 32.8 mmHg (ref 32–48)
pH, Arterial: 7.437 (ref 7.35–7.45)
pO2, Arterial: 59 mmHg — ABNORMAL LOW (ref 83–108)

## 2022-03-05 LAB — GLUCOSE, CAPILLARY
Glucose-Capillary: 124 mg/dL — ABNORMAL HIGH (ref 70–99)
Glucose-Capillary: 122 mg/dL — ABNORMAL HIGH (ref 70–99)
Glucose-Capillary: 111 mg/dL — ABNORMAL HIGH (ref 70–99)
Glucose-Capillary: 121 mg/dL — ABNORMAL HIGH (ref 70–99)
Glucose-Capillary: 111 mg/dL — ABNORMAL HIGH (ref 70–99)
Glucose-Capillary: 114 mg/dL — ABNORMAL HIGH (ref 70–99)

## 2022-03-05 LAB — BASIC METABOLIC PANEL
Anion gap: 7 (ref 5–15)
BUN: 28 mg/dL — ABNORMAL HIGH (ref 8–23)
CO2: 23 mmol/L (ref 22–32)
Calcium: 8.2 mg/dL — ABNORMAL LOW (ref 8.9–10.3)
Chloride: 111 mmol/L (ref 98–111)
Creatinine, Ser: 0.6 mg/dL — ABNORMAL LOW (ref 0.61–1.24)
GFR, Estimated: >60 mL/min (ref >60)
Glucose, Bld: 126 mg/dL — ABNORMAL HIGH (ref 70–99)
Potassium: 3.9 mmol/L (ref 3.5–5.1)
Sodium: 141 mmol/L (ref 135–145)

## 2022-03-05 LAB — CBC WITH DIFFERENTIAL/PLATELET
Abs Immature Granulocytes: 0.17 10*3/uL — ABNORMAL HIGH (ref 0.00–0.07)
Basophils Absolute: 0 10*3/uL (ref 0.0–0.1)
Basophils Relative: 0 %
Eosinophils Absolute: 0.1 10*3/uL (ref 0.0–0.5)
Eosinophils Relative: 1 %
HCT: 32.6 % — ABNORMAL LOW (ref 39.0–52.0)
Hemoglobin: 10.5 g/dL — ABNORMAL LOW (ref 13.0–17.0)
Immature Granulocytes: 2 %
Lymphocytes Relative: 8 %
Lymphs Abs: 0.8 10*3/uL (ref 0.7–4.0)
MCH: 31.6 pg (ref 26.0–34.0)
MCHC: 32.2 g/dL (ref 30.0–36.0)
MCV: 98.2 fL (ref 80.0–100.0)
Monocytes Absolute: 1 10*3/uL (ref 0.1–1.0)
Monocytes Relative: 10 %
Neutro Abs: 7.7 10*3/uL (ref 1.7–7.7)
Neutrophils Relative %: 79 %
Platelets: 362 10*3/uL (ref 150–400)
RBC: 3.32 MIL/uL — ABNORMAL LOW (ref 4.22–5.81)
RDW: 14.6 % (ref 11.5–15.5)
WBC: 9.8 10*3/uL (ref 4.0–10.5)
nRBC: 0 % (ref 0.0–0.2)

## 2022-03-05 LAB — HEPATIC FUNCTION PANEL
ALT: 42 U/L (ref 0–44)
AST: 33 U/L (ref 15–41)
Albumin: 2.5 g/dL — ABNORMAL LOW (ref 3.5–5.0)
Alkaline Phosphatase: 122 U/L (ref 38–126)
Bilirubin, Direct: <0.1 mg/dL (ref 0.0–0.2)
Total Bilirubin: 0.8 mg/dL (ref 0.3–1.2)
Total Protein: 6.1 g/dL — ABNORMAL LOW (ref 6.5–8.1)

## 2022-03-05 MED ORDER — POLYVINYL ALCOHOL 1.4 % OP SOLN: 1.0000 [drp] | Freq: Four times a day (QID) | OPHTHALMIC | Status: DC | PRN

## 2022-03-05 MED ORDER — GLYCOPYRROLATE 1 MG PO TABS: 1.0000 mg | ORAL_TABLET | ORAL | Status: DC | PRN

## 2022-03-05 MED ORDER — GLYCOPYRROLATE 0.2 MG/ML IJ SOLN
0.2000 mg | INTRAMUSCULAR | Status: DC | PRN
  Administered 2022-03-05: 0.2 mg via INTRAVENOUS
  Filled 2022-03-05: qty 1

## 2022-03-05 MED ORDER — ACETAMINOPHEN 650 MG RE SUPP: 650.0000 mg | Freq: Four times a day (QID) | RECTAL | Status: DC | PRN

## 2022-03-05 MED ORDER — HALOPERIDOL LACTATE 5 MG/ML IJ SOLN: 0.5000 mg | INTRAMUSCULAR | Status: DC | PRN

## 2022-03-05 MED ORDER — MORPHINE SULFATE (CONCENTRATE) 10 MG/0.5ML PO SOLN: 5.0000 mg | ORAL | Status: DC | PRN

## 2022-03-05 MED ORDER — ONDANSETRON HCL 4 MG/2ML IJ SOLN: 4.0000 mg | Freq: Four times a day (QID) | INTRAMUSCULAR | Status: DC | PRN

## 2022-03-05 MED ORDER — ONDANSETRON 4 MG PO TBDP: 4.0000 mg | ORAL_TABLET | Freq: Four times a day (QID) | ORAL | Status: DC | PRN

## 2022-03-05 MED ORDER — HALOPERIDOL 1 MG PO TABS: 0.5000 mg | ORAL_TABLET | ORAL | Status: DC | PRN

## 2022-03-05 MED ORDER — BIOTENE DRY MOUTH MT LIQD: 15.0000 mL | OROMUCOSAL | Status: DC | PRN

## 2022-03-05 MED ORDER — ACETAMINOPHEN 325 MG PO TABS: 650.0000 mg | ORAL_TABLET | Freq: Four times a day (QID) | ORAL | Status: DC | PRN

## 2022-03-05 MED ORDER — HALOPERIDOL LACTATE 2 MG/ML PO CONC
0.5000 mg | ORAL | Status: DC | PRN
  Filled 2022-03-05: qty 5

## 2022-03-05 MED ORDER — MORPHINE 100MG IN NS 100ML (1MG/ML) PREMIX INFUSION
1.0000 mg/h | INTRAVENOUS | Status: DC
  Administered 2022-03-05: 1 mg/h via INTRAVENOUS
  Filled 2022-03-05: qty 100

## 2022-03-05 MED ORDER — MORPHINE BOLUS VIA INFUSION: 1.0000 mg | INTRAVENOUS | Status: DC | PRN

## 2022-03-05 MED ORDER — GLYCOPYRROLATE 0.2 MG/ML IJ SOLN: 0.2000 mg | INTRAMUSCULAR | Status: DC | PRN

## 2022-03-05 MED ORDER — HALOPERIDOL LACTATE 5 MG/ML IJ SOLN
10.0000 mg | Freq: Four times a day (QID) | INTRAMUSCULAR | Status: DC | PRN
  Administered 2022-03-05: 10 mg via INTRAVENOUS
  Filled 2022-03-05: qty 2

## 2022-03-05 NOTE — Progress Notes (Addendum)
Patient's nieces Anne Hahn and Jarvis Morgan at bedside with patient.  Patient continues to remain confused and having increased work of breathing and signs of discomfort.  PRN medications administered.  Family expressed their desire to proceed with comfort care at this time.   Palliative has been consulted but has not had the opportunity to round on patient yet, paged palliative NP Hulan Fray, NP Hulan Fray advised patient has not yet been assigned to NP on palliative service and unsure if palliative can round on patient today but they will round on patient tomorrow.  NP Hulan Fray requested RN reach out to MD to request comfort orders for patient and palliative will follow up on April 03, 2022.  Messaged MD Bobbye Morton, MD [place] advised will round on patient and meet with the family.   1845: spoke with MD Redmond Pulling.  MD Redmond Pulling advised will place comfort care orders for patient.  RN spoke with patient's family, Anne Hahn and Jarvis Morgan.  RN explained that MD Redmond Pulling would be placing orders for comfort care which would include medications to keep patient comfortable.  RN explained to family that once orders placed and medications available RN would administer medication and remove BiPap and tube feeding at that time with the focus on keeping patient comfortable.    Only patient belongings at bedside are patient's glasses, glasses given to patient's family member Anne Hahn. HCPOA: Anne Hahn Address: [place] [address] Funeral Home:  [employer] [place]

## 2022-03-05 NOTE — Progress Notes (Signed)
SLP Cancellation Note  Patient Details Name: Avan Gullett MRN: [id number] DOB: 10/19/1950   Cancelled treatment:       Reason Eval/Treat Not Completed: Medical issues which prohibited therapy. SLP continues to follow for swallowing. RN reports respiratory decline over night, now on BiPAP this am. MD says to hold any swallow testing today. Will f/u as able.     Osie Bond., M.A. [place] Office [phone]  Secure chat preferred  03/05/2022, 9:02 AM
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
## 2022-03-05 NOTE — Progress Notes (Signed)
PT Cancellation Note  Patient Details Name: Steen Bisig MRN: [id number] DOB: 01-16-1951   Cancelled Treatment:    Reason Eval/Treat Not Completed: Patient not medically ready. RN reporting pt has had a decline in resp status, on bipap, increased confusion and palliative has been consulted. RN requesting hold on PT today. Will plan to follow-up another day as able.   Moishe Spice, PT, DPT Acute Rehabilitation Services  Office: [place] 03/05/2022, 2:32 PM

## 2022-03-05 NOTE — Progress Notes (Signed)
Notified by nursing staff of family's request to discuss possible transition to comfort care. Discussed this with HC-POA by phone and compared hospital course with patient's continued clinical decline and his wishes as listed in his advance directive. She desires to transition to comfort care. Palliative care consult updated to add discussions about hospice and end-of-life orderset placed.   Additional critical care time: 23mn  AJesusita Oka MD General and [facility]
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
## 2022-03-09 NOTE — Progress Notes (Signed)
Antonio Ayers, [place] notified by Charge Nurse Joycelyn Schmid.

## 2022-03-09 NOTE — Progress Notes (Signed)
Time of death  

## 2022-03-09 NOTE — Progress Notes (Signed)
Trauma Event Note    TRN rounding on unit at 0000, noted sats in the 56s. On [place] humidified O2, no longer responsive. Returned to bedside 30 minutes later and patient had expired. Primary RN has already notified family.    Kynadi Dragos O Lorenzo Pereyra  Trauma Response RN  Please call TRN at [phone] for further assistance.

## 2022-03-09 NOTE — Progress Notes (Signed)
Trauma Event Note  Dr. Redmond Pulling notified of patients passing @ [place].    Trudee Kuster  Trauma Response RN  Please call TRN at [phone] for further assistance.

## 2022-03-09 NOTE — Progress Notes (Signed)
Time of death 35.  Verified by RN x2.  Gevena Barre, RN and Phylliss Bob, RN.  Family notified.  MD notified.

## 2022-03-09 NOTE — Progress Notes (Addendum)
Trauma Event Note  TRN at bedside to evaluate patient, noted documentation stating that patient was responding only to pain and on BiPAP. Upon my arrival to bedside, pt awake and alert, answering questions but confused. On BiPAP. Following commands appropriately. Support provided to primary RN, who has no needs at this time for me. Transitioned to comfort care this evening.   Marni Franzoni O Rizwan Kuyper  Trauma Response RN  Please call TRN at [phone] for further assistance.

## 2022-03-09 DEATH — deceased

## 2022-03-24 ENCOUNTER — Ambulatory Visit: Payer: Medicare Other | Admitting: Physician Assistant

## 2022-04-09 NOTE — Death Summary Note (Signed)
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 Aortic injury not excluded. CT of the chest is therefore suggested for further evaluation. 2. No pneumothorax. Electronically Signed: By: Zetta Bills M.D. On: 02/25/2022 10:17    Microbiology No results found for this or any previous visit (from the past 240 hour(s)).  Lab Basic Metabolic Panel: No results for input(s): "NA", "K", "CL", "CO2", "GLUCOSE", "BUN", "CREATININE", "CALCIUM", "MG", "PHOS" in the last 168 hours. Liver Function Tests: No results for input(s): "AST", "ALT", "ALKPHOS", "BILITOT", "PROT", "ALBUMIN" in the last 168  hours. No results for input(s): "LIPASE", "AMYLASE" in the last 168 hours. No results for input(s): "AMMONIA" in the last 168 hours. CBC: No results for input(s): "WBC", "NEUTROABS", "HGB", "HCT", "MCV", "PLT" in the last 168 hours. Cardiac Enzymes: No results for input(s): "CKTOTAL", "CKMB", "CKMBINDEX", "TROPONINI" in the last 168 hours. Sepsis Labs: No results for input(s): "PROCALCITON", "WBC", "LATICACIDVEN" in the last 168 hours.  Procedures/Operations  none   Jesusita Oka 03/12/2022, 10:27 AM

## 2022-04-15 ENCOUNTER — Ambulatory Visit: Payer: Medicare Other | Admitting: Cardiology

## 2022-04-30 ENCOUNTER — Ambulatory Visit: Payer: Medicare Other | Admitting: Podiatry

## 2022-05-04 ENCOUNTER — Telehealth: Payer: Medicare Other

## 2022-06-10 IMAGING — CT CT CARDIAC CORONARY ARTERY CALCIUM SCORE
3 series · 14 of 20 positions shown, 15 images · non-contrast
Comparison: CTA chest 04/11/2019
COMPARISON: CTA chest 04/11/2019

Addendum:
EXAM:
OVER-READ INTERPRETATION  CT CHEST

The following report is an over-read performed by radiologist Dr.
Salome Owen [REDACTED] on 03/17/2020. This over-read
does not include interpretation of cardiac or coronary anatomy or
pathology. The calcium score interpretation by the cardiologist is
attached.
CLINICAL DATA: Risk stratification
Coronary Calcium Score
TECHNIQUE: The patient was scanned on a Siemens Force scanner. Axial
non-contrast 3 mm slices were carried out through the heart. The
data set was analyzed on a dedicated work station and scored using
the Agatson method.

[Series 2: casc 3.0 bv41 2 bestsyst 36 % · axial · 0.31mm/px · z∈[-275,-182]mm · 4 of 53 slices shown, 5 images]
[im 11/53  vessel]
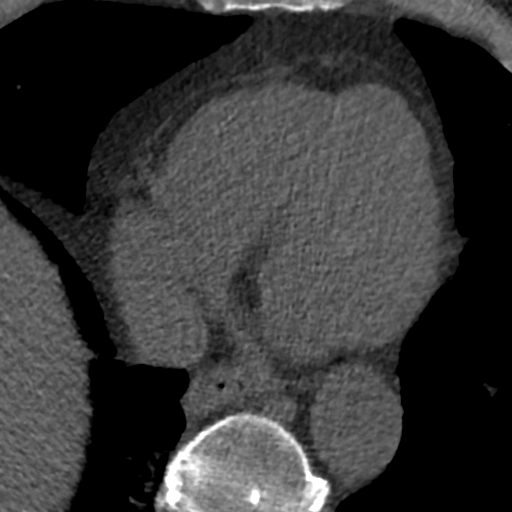
[im 11/53  lung]
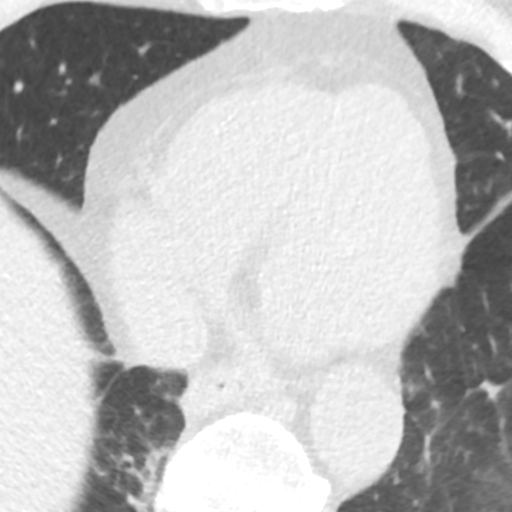
[im 21/53  vessel]
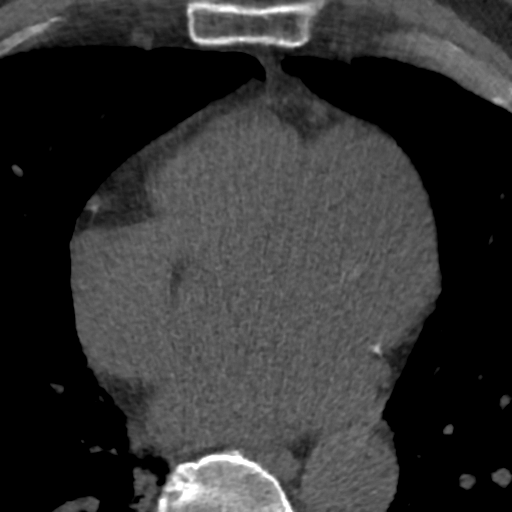
[im 32/53  vessel]
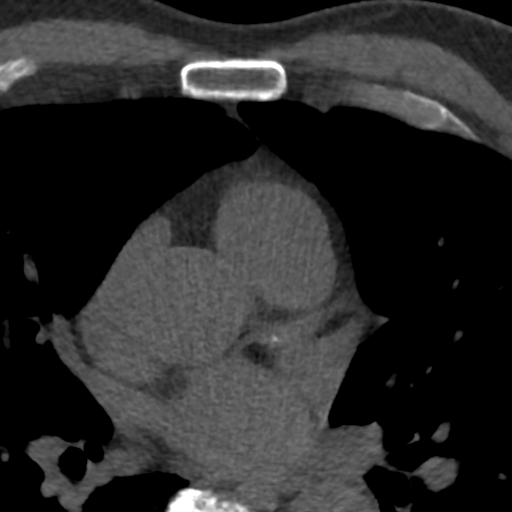
[im 42/53  vessel]
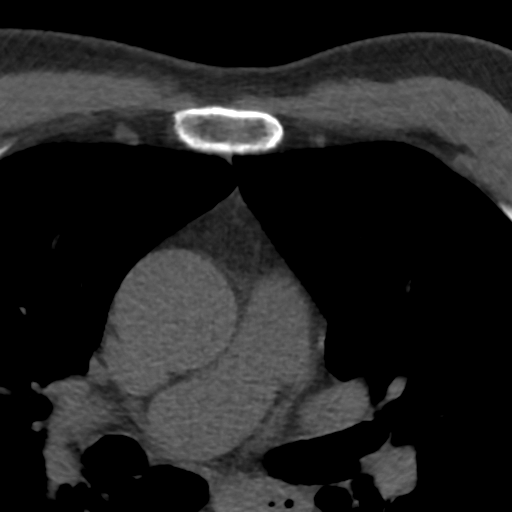

[Series 3: lung 35 % · axial · 0.68mm/px · z∈[-280,-176]mm · 5 of 53 slices shown]
[im 9/53  lung]
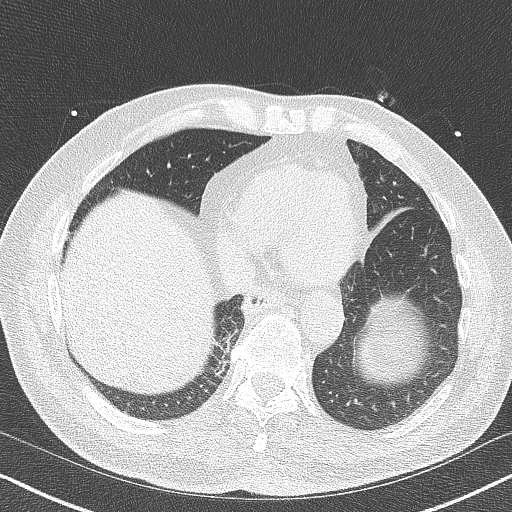
[im 18/53  lung]
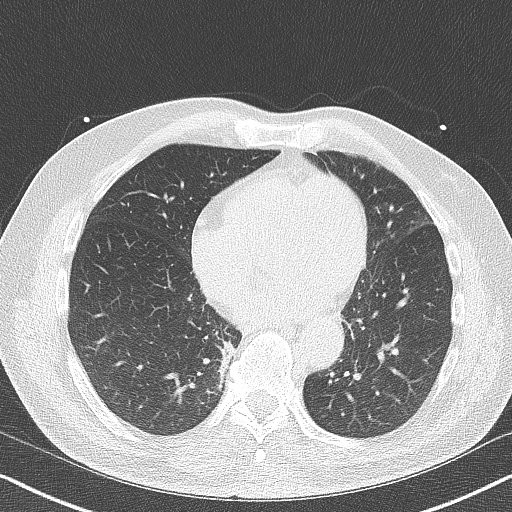
[im 27/53  lung]
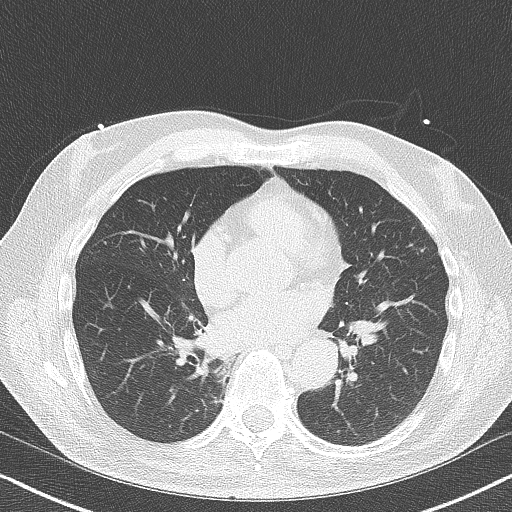
[im 35/53  lung]
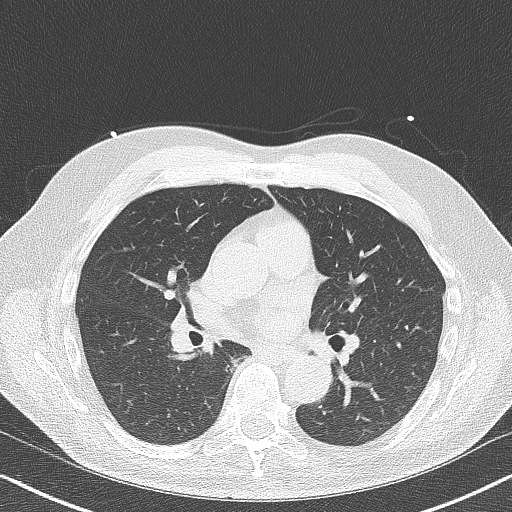
[im 44/53  lung]
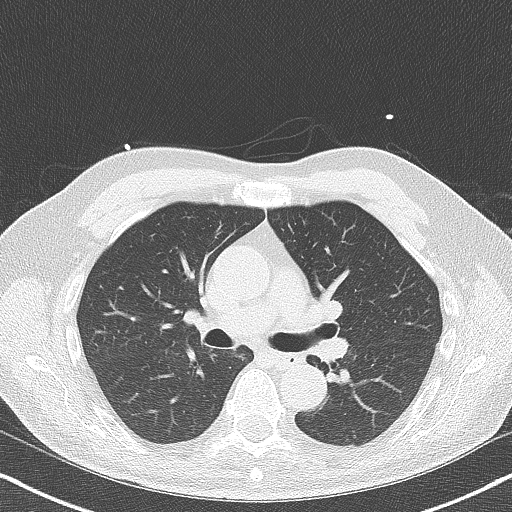

[Series 4: lung st 35 % · axial · 0.68mm/px · z∈[-280,-176]mm · 5 of 53 slices shown]
[im 9/53  lung]
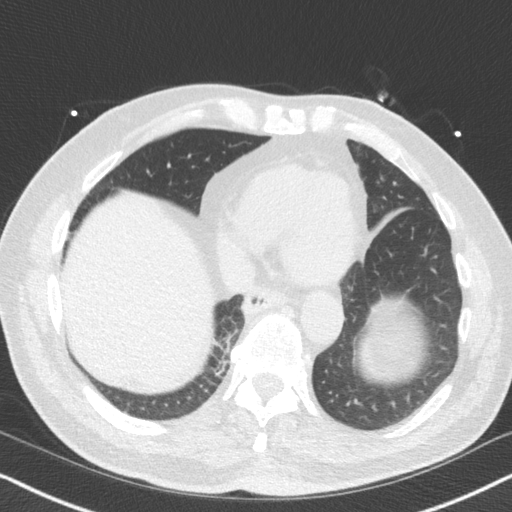
[im 18/53  lung]
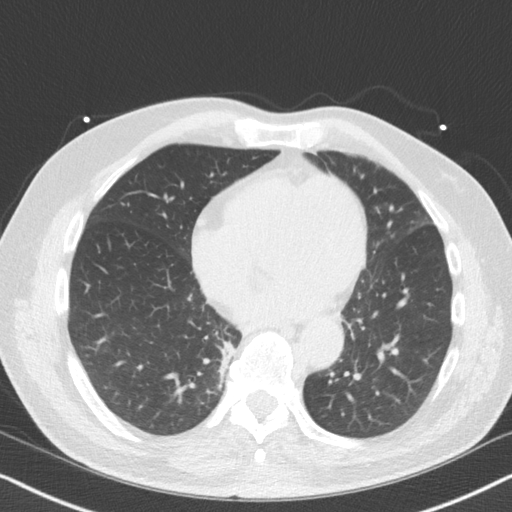
[im 27/53  lung]
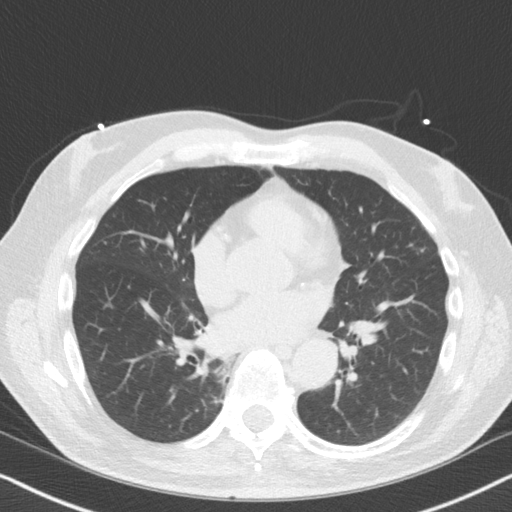
[im 35/53  lung]
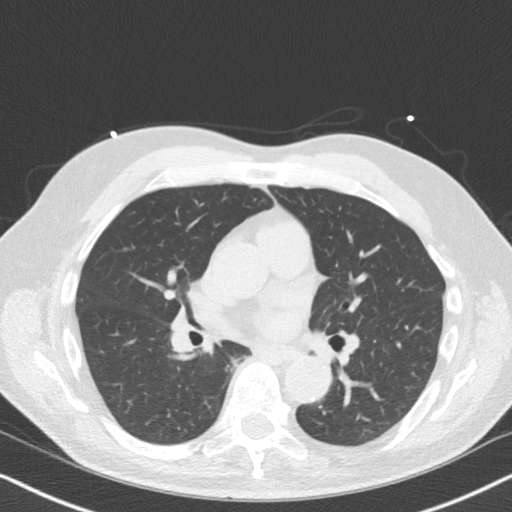
[im 44/53  lung]
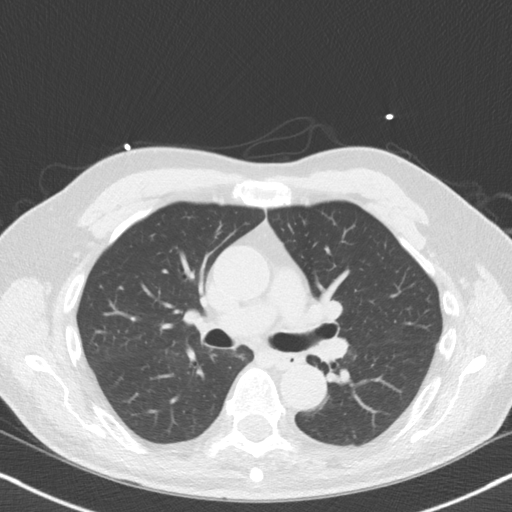

[14 of 20 positions shown; findings below may reference images not displayed]

FINDINGS: Vascular: Aortic atherosclerosis.

Mediastinum/Nodes: No imaged thoracic adenopathy. Small hiatal
hernia with surgical changes at the gastroesophageal junction.

Lungs/Pleura: No pleural fluid. Right lower lobe scarring, adjacent
prominent osteophytes medially.

Upper Abdomen: Normal imaged portions of the liver, spleen.

Musculoskeletal: Moderate bilateral gynecomastia. Lower thoracic
spondylosis.
IMPRESSION: 1.  No acute findings in the imaged extracardiac chest.
2.  Aortic Atherosclerosis (6BER8-NIA.A).
3. Bilateral gynecomastia.
FINDINGS: Non-cardiac: See separate report from [REDACTED].

Ascending Aorta: Borderline dilated at 37mm. Scattered
calcifications.

Pericardium: Normal

Coronary arteries: Normal Coronary origins
IMPRESSION: Coronary calcium score of 454. This was 71st percentile for age and
sex matched control.

Mehak Capistran

*** End of Addendum ***
EXAM:
OVER-READ INTERPRETATION  CT CHEST

The following report is an over-read performed by radiologist Dr.
Salome Owen [REDACTED] on 03/17/2020. This over-read
does not include interpretation of cardiac or coronary anatomy or
pathology. The calcium score interpretation by the cardiologist is
attached.
FINDINGS: Vascular: Aortic atherosclerosis.

Mediastinum/Nodes: No imaged thoracic adenopathy. Small hiatal
hernia with surgical changes at the gastroesophageal junction.

Lungs/Pleura: No pleural fluid. Right lower lobe scarring, adjacent
prominent osteophytes medially.

Upper Abdomen: Normal imaged portions of the liver, spleen.

Musculoskeletal: Moderate bilateral gynecomastia. Lower thoracic
spondylosis.
IMPRESSION: 1.  No acute findings in the imaged extracardiac chest.
2.  Aortic Atherosclerosis (6BER8-NIA.A).
3. Bilateral gynecomastia.

## 2022-08-19 ENCOUNTER — Other Ambulatory Visit: Payer: Medicare Other

## 2022-08-19 ENCOUNTER — Ambulatory Visit: Payer: Medicare Other | Admitting: Hematology and Oncology

## 2023-08-11 ENCOUNTER — Ambulatory Visit: Payer: Medicare Other | Admitting: Sports Medicine

## 2023-11-25 IMAGING — DX DG BONE SURVEY MET
9 of 10 series · 9 of 10 positions shown · non-contrast
Comparison: None.

CLINICAL DATA: Monoclonal gammopathy of undetermined significance

EXAM:
METASTATIC BONE SURVEY

[skull lat]
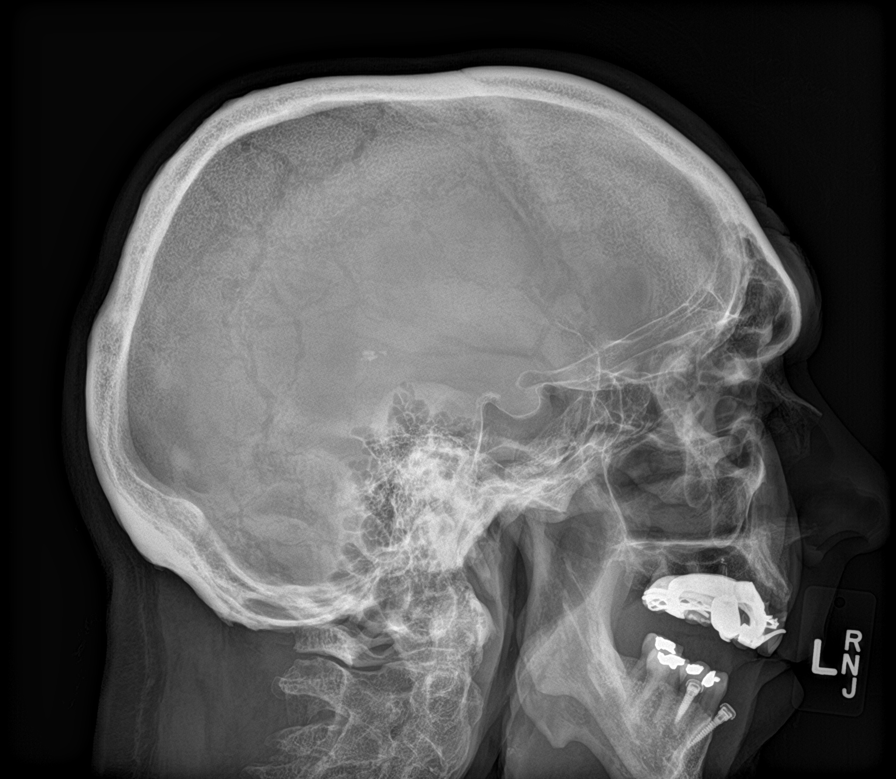

[shoulder ap (1 of 2)]
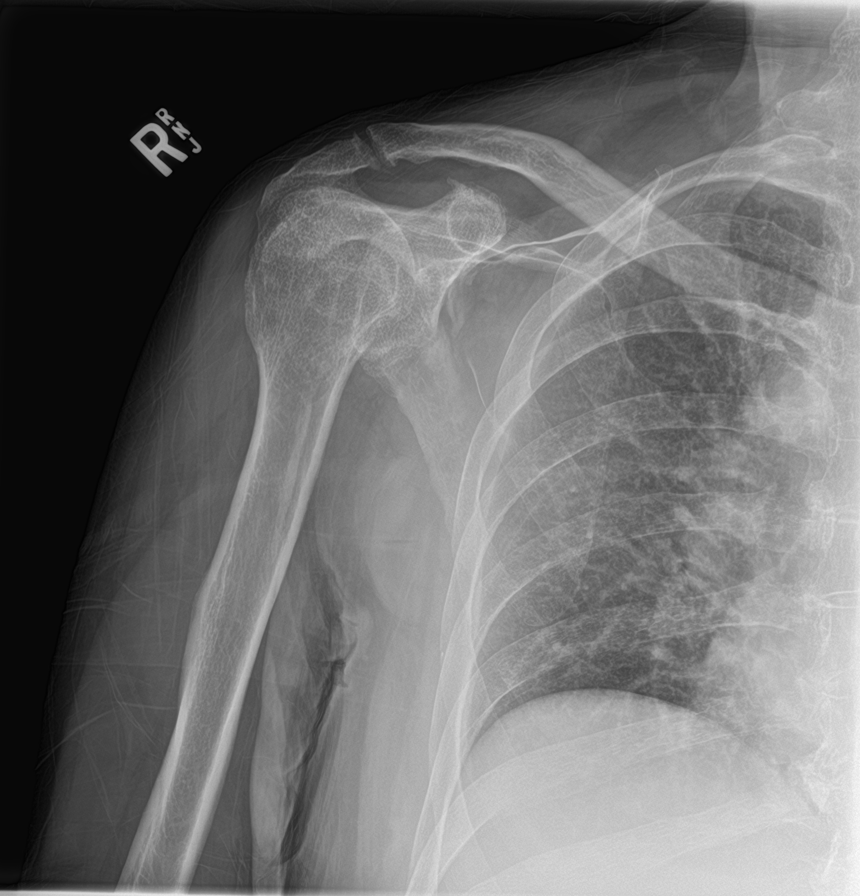

[shoulder ap (2 of 2)]
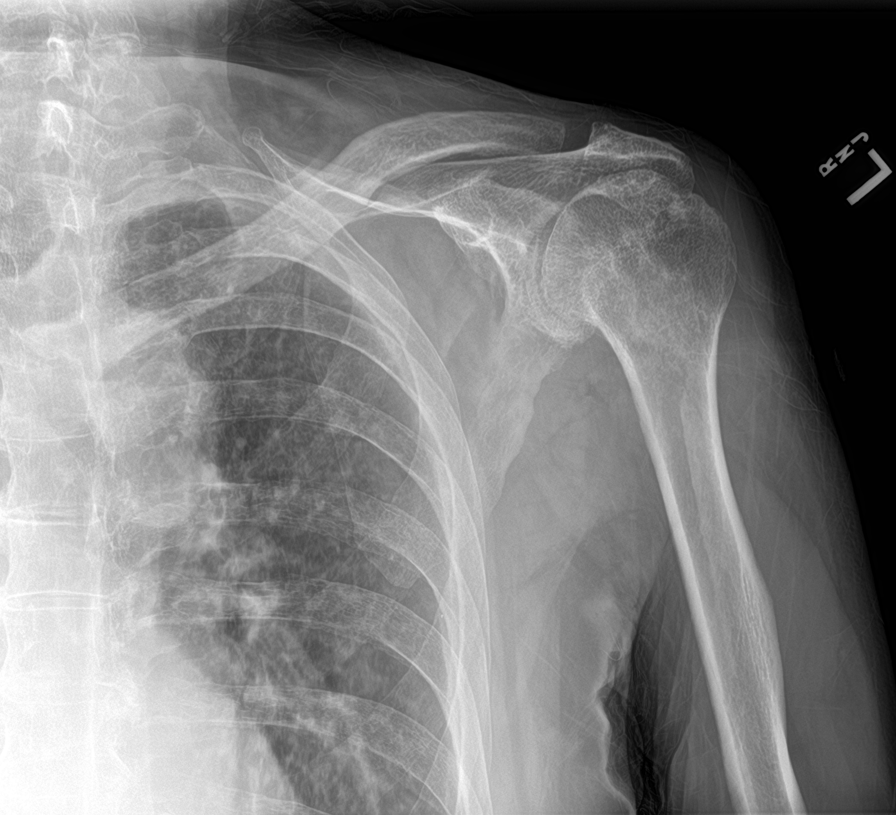

[humerus ap (1 of 2)]
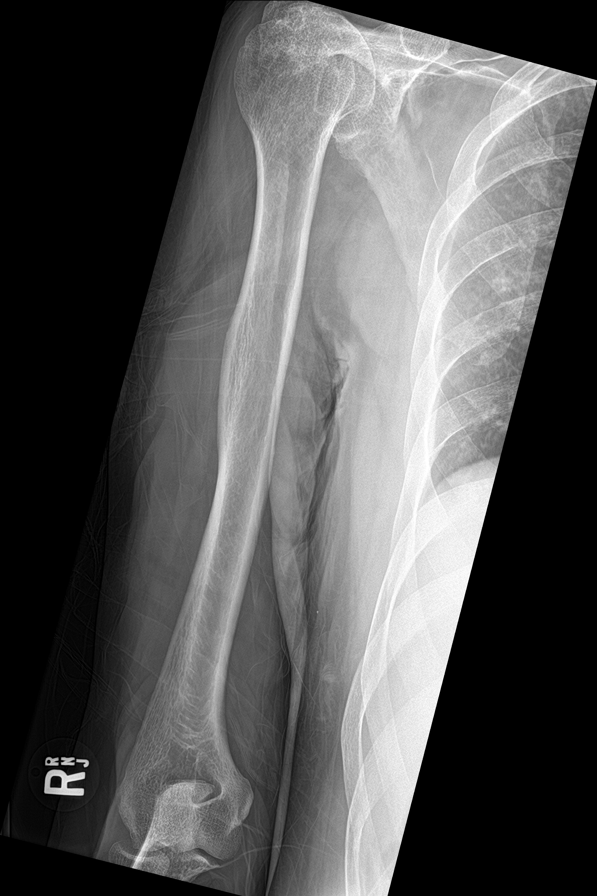

[humerus ap (2 of 2)]
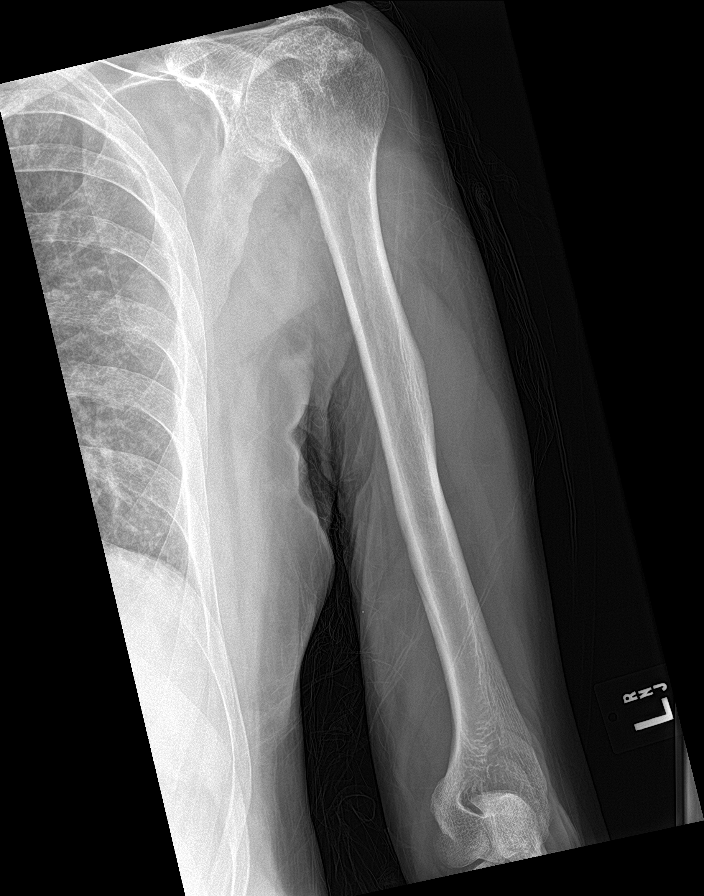

[forearm ap (1 of 2)]
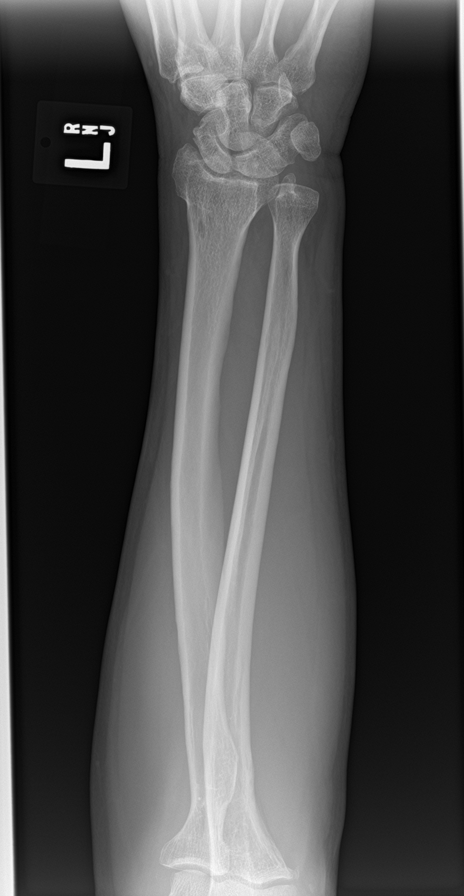

[forearm ap (2 of 2)]
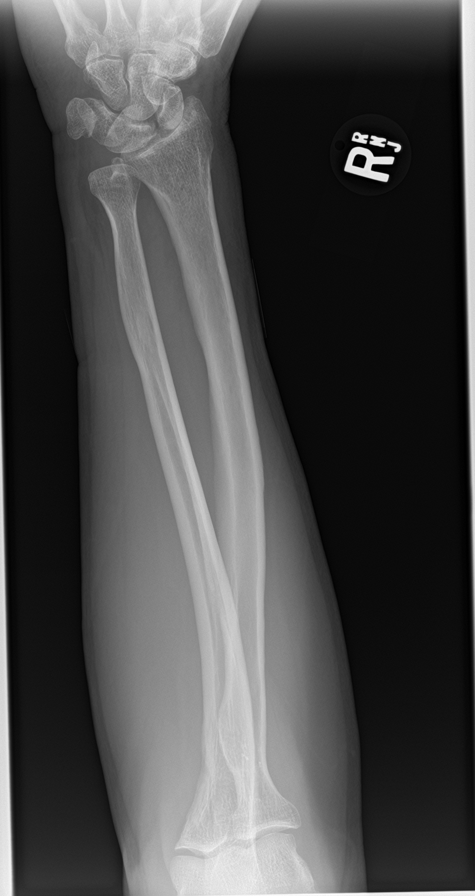

[c-spine ap]
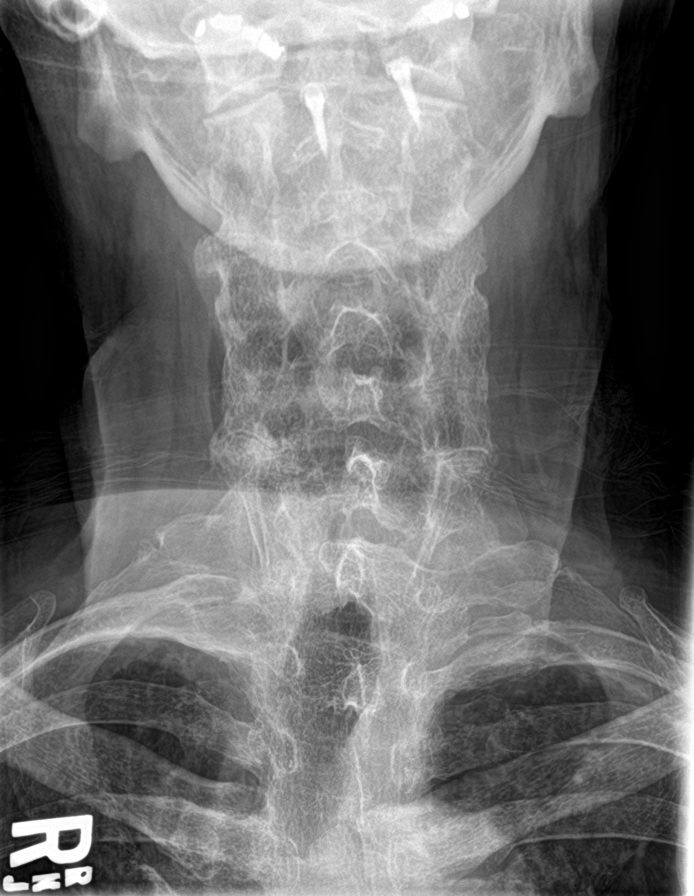

[c-spine lat]
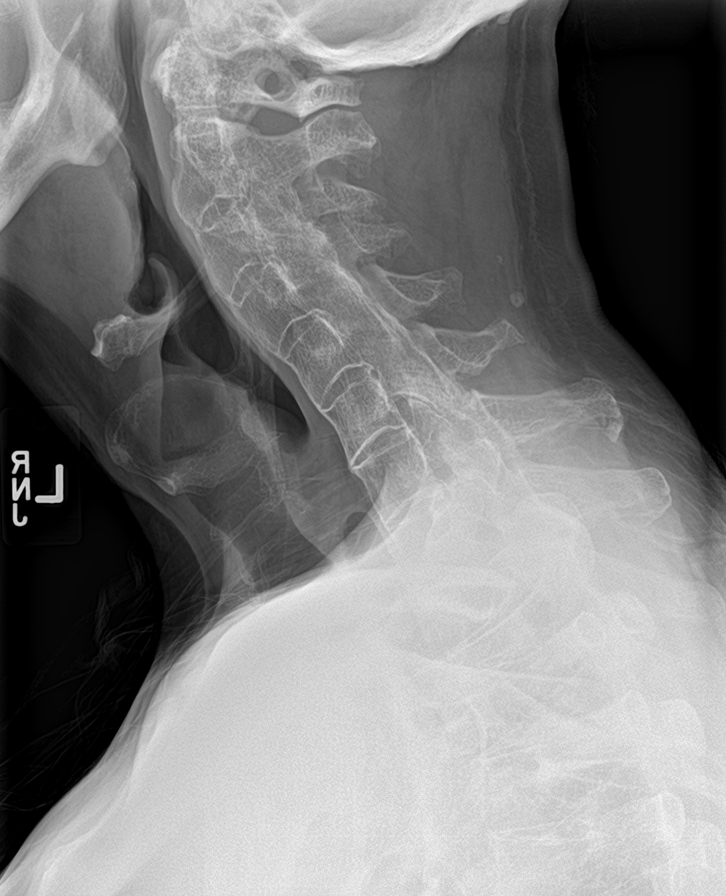

[9 of 10 positions shown; findings below may reference images not displayed]

FINDINGS: There are no focal lytic lesions in bony structures. Degenerative
changes are noted in both shoulders. There is deformity in the
distal end of left radius, possibly residual change from previous
injury. Degenerative changes are noted in the cervical, thoracic and
lumbar spine. There is calcification in the anterior spinal ligament
in the cervical spine suggesting possible ankylosing spondylitis.
There is no definite demonstrable calcification in the anterior
spinal ligament in the thoracic spine and lumbar spine. There is
possible spondylolysis in the L5 vertebra. Degenerative changes are
noted in thoracic and lumbar spine. Severe degenerative changes are
noted in both hips more so on the left side. There is no
demonstrable break in the cortical margins. There is deformity in
the head and neck of both femurs. Lung fields are clear of any
discrete nodules or infiltrates.
IMPRESSION: There are no focal lytic lesions. Severe degenerative changes are
noted in both hips, more so on the left side. Moderate degenerative
changes are noted in the both shoulders. Spondylolysis is seen in
the L5 vertebra. Degenerative changes are noted in the cervical,
thoracic and lumbar spine.

Other findings as described in the body of the report.
